# Patient Record
Sex: Male | Born: 1940 | Race: White | Hispanic: No | Marital: Married | Smoking: Former smoker
Health system: Southern US, Community
[De-identification: ages and names within clinical notes are randomized; demographics above are authoritative.]

## PROBLEM LIST (undated history)

## (undated) DIAGNOSIS — K219 Gastro-esophageal reflux disease without esophagitis: Secondary | ICD-10-CM

## (undated) DIAGNOSIS — N189 Chronic kidney disease, unspecified: Secondary | ICD-10-CM

## (undated) DIAGNOSIS — Z973 Presence of spectacles and contact lenses: Secondary | ICD-10-CM

## (undated) DIAGNOSIS — E785 Hyperlipidemia, unspecified: Secondary | ICD-10-CM

## (undated) DIAGNOSIS — C801 Malignant (primary) neoplasm, unspecified: Secondary | ICD-10-CM

## (undated) DIAGNOSIS — I10 Essential (primary) hypertension: Secondary | ICD-10-CM

## (undated) DIAGNOSIS — N201 Calculus of ureter: Secondary | ICD-10-CM

## (undated) DIAGNOSIS — M1990 Unspecified osteoarthritis, unspecified site: Secondary | ICD-10-CM

## (undated) DIAGNOSIS — J45909 Unspecified asthma, uncomplicated: Secondary | ICD-10-CM

## (undated) DIAGNOSIS — Z8711 Personal history of peptic ulcer disease: Secondary | ICD-10-CM

## (undated) DIAGNOSIS — Z992 Dependence on renal dialysis: Secondary | ICD-10-CM

## (undated) DIAGNOSIS — Z972 Presence of dental prosthetic device (complete) (partial): Secondary | ICD-10-CM

## (undated) HISTORY — PX: TONSILLECTOMY: SUR1361

## (undated) HISTORY — PX: HERNIA REPAIR: SHX51

## (undated) HISTORY — PX: CATARACT EXTRACTION W/ INTRAOCULAR LENS  IMPLANT, BILATERAL: SHX1307

## (undated) HISTORY — DX: Chronic kidney disease, unspecified: N18.9

## (undated) HISTORY — PX: MULTIPLE TOOTH EXTRACTIONS: SHX2053

## (undated) HISTORY — DX: Hyperlipidemia, unspecified: E78.5

## (undated) HISTORY — PX: FRACTURE SURGERY: SHX138

---

## 1968-10-03 DIAGNOSIS — Z8711 Personal history of peptic ulcer disease: Secondary | ICD-10-CM

## 1968-10-03 HISTORY — DX: Personal history of peptic ulcer disease: Z87.11

## 2009-02-02 HISTORY — PX: ROBOT ASSISTED LAPAROSCOPIC RADICAL PROSTATECTOMY: SHX5141

## 2009-05-03 ENCOUNTER — Ambulatory Visit (HOSPITAL_COMMUNITY): Admission: RE | Admit: 2009-05-03 | Discharge: 2009-05-03 | Payer: Self-pay | Admitting: Urology

## 2009-07-29 ENCOUNTER — Encounter (INDEPENDENT_AMBULATORY_CARE_PROVIDER_SITE_OTHER): Payer: Self-pay | Admitting: Urology

## 2009-07-29 ENCOUNTER — Inpatient Hospital Stay (HOSPITAL_COMMUNITY): Admission: RE | Admit: 2009-07-29 | Discharge: 2009-07-30 | Payer: Self-pay | Admitting: Urology

## 2010-04-20 LAB — BASIC METABOLIC PANEL
BUN: 31 mg/dL — ABNORMAL HIGH (ref 6–23)
CO2: 29 mEq/L (ref 19–32)
Calcium: 8.6 mg/dL (ref 8.4–10.5)
Chloride: 104 mEq/L (ref 96–112)
Chloride: 105 mEq/L (ref 96–112)
Creatinine, Ser: 1.99 mg/dL — ABNORMAL HIGH (ref 0.4–1.5)
GFR calc Af Amer: 41 mL/min — ABNORMAL LOW (ref >60)
GFR calc Af Amer: 48 mL/min — ABNORMAL LOW (ref >60)
GFR calc non Af Amer: 34 mL/min — ABNORMAL LOW (ref >60)
Potassium: 4.4 mEq/L (ref 3.5–5.1)
Sodium: 137 mEq/L (ref 135–145)

## 2010-04-20 LAB — CBC
MCHC: 32.8 g/dL (ref 30.0–36.0)
MCV: 95.1 fL (ref 78.0–100.0)
Platelets: 157 10*3/uL (ref 150–400)
WBC: 6.1 10*3/uL (ref 4.0–10.5)

## 2010-04-20 LAB — TYPE AND SCREEN

## 2010-04-20 LAB — ABO/RH: ABO/RH(D): A POS

## 2010-04-20 LAB — HEMOGLOBIN AND HEMATOCRIT, BLOOD
HCT: 33.1 % — ABNORMAL LOW (ref 39.0–52.0)
Hemoglobin: 11.2 g/dL — ABNORMAL LOW (ref 13.0–17.0)
Hemoglobin: 12.1 g/dL — ABNORMAL LOW (ref 13.0–17.0)

## 2010-04-20 LAB — SURGICAL PCR SCREEN: MRSA, PCR: NEGATIVE

## 2011-02-06 ENCOUNTER — Other Ambulatory Visit: Payer: Self-pay | Admitting: Physician Assistant

## 2012-05-16 ENCOUNTER — Other Ambulatory Visit: Payer: Self-pay | Admitting: Urology

## 2012-05-17 ENCOUNTER — Encounter (HOSPITAL_BASED_OUTPATIENT_CLINIC_OR_DEPARTMENT_OTHER): Payer: Self-pay | Admitting: *Deleted

## 2012-05-17 NOTE — Progress Notes (Signed)
Pt instructed npo p mn 4/20.  To [facility] 4/21 @ 0830.  Needs istat, ekg on arrival

## 2012-05-19 NOTE — H&P (Signed)
Reason For Visit  Taylor Willis presents today for followup with [name]. The patient presented in January of this year for routine prostate cancer followup was noted to have significant a symptomatic microhematuria. PSA continued to be undetectable. The patient underwent cystoscopy which showed a healthy urethra and no other obvious pathology. CT imaging however did reveal a 5 mm right distal ureteral stone. He came back for follow up and saw [name] where on [name] it was felt that the stone was still present. He is now here for reassessment.   History of Present Illness     Past Taylor Willis:      Taylor Willis  is  status post robotic prostatectomy (07/2009).  The patient did have a more worrisome 4+4=8 cancer going into surgery.  Fortunately we were fairly encouraged by his path report.  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  No evidence of extracapsular extension, negative margins.  Lymph nodes also negative.   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  His incontinence is not terribly bothersome to him.  Erectile activity continues to slowly improve.  Viagra results in a 75% erectile that is adequate for intercourse.  He also continues to use a vacuum erection device.  Stream is, otherwise, strong without other voiding complaints and he typically is dry at night.   Past Medical History Problems  1. History of  Asthma 493.90 2. History of  Gastric Ulcer 531.90 3. History of  Gout 274.00 4. History of  Hypertension 401.9 5. History of  Skin Cancer V10.83  Surgical History Problems  1. History of  Prostatectomy Robotic-Assisted 2. History of  Wrist Surgery  Current Meds 1. Adult Aspirin Low Strength 81 MG Oral Tablet Dispersible; 1 per day; Therapy:  (Recorded:08Jun2012) to 2. Allopurinol TABS; 1 per day; Therapy: (Recorded:08Jun2012) to 3. Lisinopril-Hydrochlorothiazide TABS; TAKE 1 TABLET DAILY; Therapy: (Recorded:08Jun2012)  to 4. Posture-D TABS; 1 per day; Therapy: (Recorded:08Jun2012) to 5. Tamsulosin HCl 0.4 MG Oral Capsule; TAKE 1 CAPSULE BY MOUTH  DAILY; Therapy:  [id number] to (Last Rx:14Mar2014)  Requested for: [id number] 6. Ventolin HFA 108 (90 Base) MCG/ACT Inhalation Aerosol Solution; AS NEEDED; Therapy:  (Recorded:08Jun2012) to 7. Viagra 100 MG Oral Tablet; TAKE AS DIRECTED; Therapy: [phone] to (Last Rx:19Oct2012)   Requested for: 19Oct2012 8. Vitamin B12 TABS; 1 per  day; Therapy: (Recorded:08Jun2012) to  Allergies Medication  1. No Known Drug Allergies  Family History Problems  1. Maternal grandfather's history of  Diabetes Mellitus V18.0 2. [name] history of  Diabetes Mellitus V18.0 3. Family history of  Family Health Status Number Of Children 1 son and 1 daughter (both deceased) 82. Family history of  Father Deceased At Age ____ 24y/o died in War 72. [name] history of  Heart Disease V17.49 6. Family history of  Mother Deceased At Age ____ died MVA  Social History Problems  1. Activities Of Daily Living 2. Being A Social Drinker 3. Caffeine Use 1 qd 4. Exercise Habits He is exercising regularly at home with mobility and stretching exercises some resistance but no cardio. He is very active on the job with lots of walking. 5. Living Independently With Spouse 6. Marital History - Currently Married 7. 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 Self-reliant In Usual Daily Activities 9. 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  Genitourinary: incontinence and erectile dysfunction, but no hematuria.  Respiratory: cough and shortness of breath during exertion.    Vitals Vital Signs [Data Includes: Last 1 Day]  11Apr2014  03:37PM  Blood Pressure: 146 / 81 Temperature: 97.8 F Heart Rate: 70  Well-developed well-nourished male in no acute distress Respiratory: Normal effort Cardiac: Regular rate and rhythm Abdomen: Soft and nontender. No palpable masses Genitourinary: Normal external genitalia Extremities: No tenderness no edema  Results/Data Urine [Data Includes: Last 1 Day]   11Apr2014 COLOR YELLOW  APPEARANCE CLEAR  SPECIFIC GRAVITY 1.020  pH 7.0  GLUCOSE NEG mg/dL BILIRUBIN NEG  KETONE NEG mg/dL BLOOD NEG  PROTEIN NEG mg/dL UROBILINOGEN 0.2 mg/dL NITRITE NEG  LEUKOCYTE ESTERASE NEG   Assessment Assessed  1. Prostate Cancer 185 2. Distal Ureteral Stone On The Right 592.1  Plan Health Maintenance (V70.0)  1. UA With REFLEX  Done: 11Apr2014 02:52PM  Discussion/Summary   Clinically Taylor Willis is doing fine. He is not having any significant flank discomfort. He was, however, noted to have a stone in his right distal ureter several months ago and based on [name] today, there continues to be a faint calcification in the same exact area as noted no CT. His urine is clear, but presumptively the stone is still there. At this point, I do recommend that we go ahead and plan on intervention. Really, nothing good can come of leaving a stone in the distal ureter and therefore I do think that we need to go ahead and perform ureteroscopy with Holmium laser lithotripsy. I would plan on outpatient surgery. Otherwise, from a prostate cancer standpoint, things are fairly stable. The patient's PSA's have continued to be undetectable. He will be due for additional PSA check in a couple of months.

## 2012-05-23 ENCOUNTER — Ambulatory Visit (HOSPITAL_BASED_OUTPATIENT_CLINIC_OR_DEPARTMENT_OTHER)
Admission: RE | Admit: 2012-05-23 | Discharge: 2012-05-23 | Disposition: A | Discharge status: Home / Self Care | Payer: Medicare Other | Source: Ambulatory Visit | Attending: Urology | Admitting: Urology

## 2012-05-23 ENCOUNTER — Encounter (HOSPITAL_BASED_OUTPATIENT_CLINIC_OR_DEPARTMENT_OTHER): Payer: Self-pay | Admitting: Anesthesiology

## 2012-05-23 ENCOUNTER — Other Ambulatory Visit: Payer: Self-pay

## 2012-05-23 ENCOUNTER — Encounter (HOSPITAL_BASED_OUTPATIENT_CLINIC_OR_DEPARTMENT_OTHER)
Admission: RE | Disposition: A | Discharge status: Home / Self Care | Payer: Self-pay | Source: Ambulatory Visit | Attending: Urology

## 2012-05-23 ENCOUNTER — Ambulatory Visit (HOSPITAL_BASED_OUTPATIENT_CLINIC_OR_DEPARTMENT_OTHER): Payer: Medicare Other | Admitting: Anesthesiology

## 2012-05-23 DIAGNOSIS — Z85828 Personal history of other malignant neoplasm of skin: Secondary | ICD-10-CM | POA: Insufficient documentation

## 2012-05-23 DIAGNOSIS — N529 Male erectile dysfunction, unspecified: Secondary | ICD-10-CM | POA: Insufficient documentation

## 2012-05-23 DIAGNOSIS — M109 Gout, unspecified: Secondary | ICD-10-CM | POA: Insufficient documentation

## 2012-05-23 DIAGNOSIS — Z79899 Other long term (current) drug therapy: Secondary | ICD-10-CM | POA: Insufficient documentation

## 2012-05-23 DIAGNOSIS — Z9079 Acquired absence of other genital organ(s): Secondary | ICD-10-CM | POA: Insufficient documentation

## 2012-05-23 DIAGNOSIS — N393 Stress incontinence (female) (male): Secondary | ICD-10-CM | POA: Insufficient documentation

## 2012-05-23 DIAGNOSIS — Z7982 Long term (current) use of aspirin: Secondary | ICD-10-CM | POA: Insufficient documentation

## 2012-05-23 DIAGNOSIS — E669 Obesity, unspecified: Secondary | ICD-10-CM | POA: Insufficient documentation

## 2012-05-23 DIAGNOSIS — I10 Essential (primary) hypertension: Secondary | ICD-10-CM | POA: Insufficient documentation

## 2012-05-23 DIAGNOSIS — Z87891 Personal history of nicotine dependence: Secondary | ICD-10-CM | POA: Insufficient documentation

## 2012-05-23 DIAGNOSIS — N201 Calculus of ureter: Secondary | ICD-10-CM | POA: Insufficient documentation

## 2012-05-23 DIAGNOSIS — J45909 Unspecified asthma, uncomplicated: Secondary | ICD-10-CM | POA: Insufficient documentation

## 2012-05-23 DIAGNOSIS — Z8546 Personal history of malignant neoplasm of prostate: Secondary | ICD-10-CM | POA: Insufficient documentation

## 2012-05-23 DIAGNOSIS — Z6831 Body mass index (BMI) 31.0-31.9, adult: Secondary | ICD-10-CM | POA: Insufficient documentation

## 2012-05-23 HISTORY — PX: CYSTOSCOPY/RETROGRADE/URETEROSCOPY/STONE EXTRACTION WITH BASKET: SHX5317

## 2012-05-23 HISTORY — DX: Unspecified asthma, uncomplicated: J45.909

## 2012-05-23 HISTORY — DX: Calculus of ureter: N20.1

## 2012-05-23 HISTORY — DX: Essential (primary) hypertension: I10

## 2012-05-23 HISTORY — DX: Unspecified osteoarthritis, unspecified site: M19.90

## 2012-05-23 HISTORY — PX: HOLMIUM LASER APPLICATION: SHX5852

## 2012-05-23 HISTORY — DX: Personal history of peptic ulcer disease: Z87.11

## 2012-05-23 LAB — POCT I-STAT 4, (NA,K, GLUC, HGB,HCT)
Glucose, Bld: 110 mg/dL — ABNORMAL HIGH (ref 70–99)
HCT: 41 % (ref 39.0–52.0)

## 2012-05-23 SURGERY — HOLMIUM LASER APPLICATION
Anesthesia: General | Site: Ureter | Laterality: Right | Wound class: Clean Contaminated

## 2012-05-23 MED ORDER — KETOROLAC TROMETHAMINE 30 MG/ML IJ SOLN
INTRAMUSCULAR | Status: DC | PRN
  Administered 2012-05-23: 30 mg via INTRAVENOUS

## 2012-05-23 MED ORDER — HYDROCODONE-ACETAMINOPHEN 5-325 MG PO TABS: 1.0000 | ORAL_TABLET | Freq: Four times a day (QID) | ORAL | Status: DC | PRN

## 2012-05-23 MED ORDER — LACTATED RINGERS IV SOLN
INTRAVENOUS | Status: DC
  Administered 2012-05-23: 09:00:00 via INTRAVENOUS
  Filled 2012-05-23: qty 1000

## 2012-05-23 MED ORDER — PROMETHAZINE HCL 25 MG/ML IJ SOLN
6.2500 mg | INTRAMUSCULAR | Status: DC | PRN
  Filled 2012-05-23: qty 1

## 2012-05-23 MED ORDER — PROPOFOL 10 MG/ML IV BOLUS
INTRAVENOUS | Status: DC | PRN
  Administered 2012-05-23: 350 mg via INTRAVENOUS

## 2012-05-23 MED ORDER — LIDOCAINE HCL (CARDIAC) 20 MG/ML IV SOLN
INTRAVENOUS | Status: DC | PRN
  Administered 2012-05-23: 75 mg via INTRAVENOUS

## 2012-05-23 MED ORDER — CIPROFLOXACIN IN D5W 400 MG/200ML IV SOLN
400.0000 mg | INTRAVENOUS | Status: AC
  Administered 2012-05-23: 400 mg via INTRAVENOUS
  Filled 2012-05-23: qty 200

## 2012-05-23 MED ORDER — SODIUM CHLORIDE 0.9 % IR SOLN
Status: DC | PRN
  Administered 2012-05-23: 6000 mL via INTRAVESICAL

## 2012-05-23 MED ORDER — IOHEXOL 350 MG/ML SOLN
INTRAVENOUS | Status: DC | PRN
  Administered 2012-05-23: 2 mL

## 2012-05-23 MED ORDER — FENTANYL CITRATE 0.05 MG/ML IJ SOLN
INTRAMUSCULAR | Status: DC | PRN
  Administered 2012-05-23 (×2): 25 ug via INTRAVENOUS
  Administered 2012-05-23: 50 ug via INTRAVENOUS

## 2012-05-23 MED ORDER — DEXAMETHASONE SODIUM PHOSPHATE 4 MG/ML IJ SOLN
INTRAMUSCULAR | Status: DC | PRN
  Administered 2012-05-23: 10 mg via INTRAVENOUS

## 2012-05-23 MED ORDER — FENTANYL CITRATE 0.05 MG/ML IJ SOLN
25.0000 ug | INTRAMUSCULAR | Status: DC | PRN
  Filled 2012-05-23: qty 1

## 2012-05-23 MED ORDER — ONDANSETRON HCL 4 MG/2ML IJ SOLN
INTRAMUSCULAR | Status: DC | PRN
  Administered 2012-05-23: 4 mg via INTRAVENOUS

## 2012-05-23 MED ORDER — LIDOCAINE HCL 2 % EX GEL
CUTANEOUS | Status: DC | PRN
  Administered 2012-05-23: 1 via URETHRAL

## 2012-05-23 SURGICAL SUPPLY — 32 items
ADAPTER CATH URET PLST 4-6FR (CATHETERS) ×3 IMPLANT
BAG DRAIN URO-CYSTO SKYTR STRL (DRAIN) ×3 IMPLANT
BASKET LASER NITINOL 1.9FR (BASKET) IMPLANT
BASKET STNLS GEMINI 4WIRE 3FR (BASKET) IMPLANT
BASKET ZERO TIP NITINOL 2.4FR (BASKET) ×3 IMPLANT
BENZOIN TINCTURE PRP APPL 2/3 (GAUZE/BANDAGES/DRESSINGS) IMPLANT
CANISTER SUCT LVC 12 LTR MEDI- (MISCELLANEOUS) ×3 IMPLANT
CATH INTERMIT  6FR 70CM (CATHETERS) ×3 IMPLANT
CATH URET 5FR 28IN CONE TIP (BALLOONS)
CATH URET 5FR 28IN OPEN ENDED (CATHETERS) IMPLANT
CATH URET 5FR 70CM CONE TIP (BALLOONS) IMPLANT
CLOTH BEACON ORANGE TIMEOUT ST (SAFETY) ×3 IMPLANT
DRAPE CAMERA CLOSED 9X96 (DRAPES) ×3 IMPLANT
DRSG TEGADERM 2-3/8X2-3/4 SM (GAUZE/BANDAGES/DRESSINGS) IMPLANT
GLOVE BIO SURGEON STRL SZ 6.5 (GLOVE) ×3 IMPLANT
GLOVE BIO SURGEON STRL SZ7 (GLOVE) ×3 IMPLANT
GLOVE BIO SURGEON STRL SZ7.5 (GLOVE) ×3 IMPLANT
GOWN STRL REIN XL XLG (GOWN DISPOSABLE) ×3 IMPLANT
GUIDEWIRE 0.038 PTFE COATED (WIRE) IMPLANT
GUIDEWIRE ANG ZIPWIRE 038X150 (WIRE) IMPLANT
GUIDEWIRE STR DUAL SENSOR (WIRE) ×3 IMPLANT
IV NS IRRIG 3000ML ARTHROMATIC (IV SOLUTION) ×6 IMPLANT
KIT BALLIN UROMAX 15FX10 (LABEL) IMPLANT
KIT BALLN UROMAX 15FX4 (MISCELLANEOUS) IMPLANT
KIT BALLN UROMAX 26 75X4 (MISCELLANEOUS)
LASER FIBER DISP (UROLOGICAL SUPPLIES) ×3 IMPLANT
LASER FIBER DISP 1000U (UROLOGICAL SUPPLIES) IMPLANT
NS IRRIG 500ML POUR BTL (IV SOLUTION) IMPLANT
PACK CYSTOSCOPY (CUSTOM PROCEDURE TRAY) ×3 IMPLANT
SET HIGH PRES BAL DIL (LABEL)
SHEATH ACCESS URETERAL 38CM (SHEATH) IMPLANT
SHEATH ACCESS URETERAL 54CM (SHEATH) IMPLANT

## 2012-05-23 NOTE — Anesthesia Postprocedure Evaluation (Signed)
  Anesthesia Post-op Note  Patient: Taylor Willis  Procedure(s) Performed: Procedure(s) (LRB): HOLMIUM LASER APPLICATION (Right) CYSTOSCOPY/RETROGRADE/URETEROSCOPY/STONE EXTRACTION WITH BASKET (Right)  Patient Location: PACU  Anesthesia Type: General  Level of Consciousness: awake and alert   Airway and Oxygen Therapy: Patient Spontanous Breathing  Post-op Pain: mild  Post-op Assessment: Post-op Vital signs reviewed, Patient's Cardiovascular Status Stable, Respiratory Function Stable, Patent Airway and No signs of Nausea or vomiting  Last Vitals:  Filed Vitals:   05/23/12 1100  BP: 138/68  Pulse: 55  Temp:   Resp: 10    Post-op Vital Signs: stable   Complications: No apparent anesthesia complications

## 2012-05-23 NOTE — Anesthesia Procedure Notes (Signed)
Procedure Name: LMA Insertion Date/Time: 05/23/2012 9:45 AM Performed by: Christiana Fuchs Pre-anesthesia Checklist: Patient identified, Emergency Drugs available, Suction available and Patient being monitored Patient Re-evaluated:Patient Re-evaluated prior to inductionOxygen Delivery Method: Circle System Utilized Preoxygenation: Pre-oxygenation with 100% oxygen Intubation Type: IV induction Ventilation: Mask ventilation without difficulty LMA: LMA inserted LMA Size: 5.0 Number of attempts: 1 Airway Equipment and Method: bite block Placement Confirmation: positive ETCO2 Tube secured with: Tape Dental Injury: Teeth and Oropharynx as per pre-operative assessment

## 2012-05-23 NOTE — Anesthesia Preprocedure Evaluation (Addendum)
Anesthesia Evaluation  Patient identified by MRN, date of birth, ID band Patient awake    Reviewed: Allergy & Precautions, H&P , NPO status , Patient's Chart, lab work & pertinent test results  Airway Mallampati: II TM Distance: >3 FB Neck ROM: Full    Dental  (+) Upper Dentures, Lower Dentures and Dental Advisory Given   Pulmonary asthma ,  Last inhaler use yesterday. Breathing well today. breath sounds clear to auscultation  Pulmonary exam normal       Cardiovascular Exercise Tolerance: Good hypertension, Pt. on medications Rhythm:Regular Rate:Normal     Neuro/Psych negative neurological ROS  negative psych ROS   GI/Hepatic negative GI ROS, Neg liver ROS,   Endo/Other  negative endocrine ROS  Renal/GU negative Renal ROS  negative genitourinary   Musculoskeletal negative musculoskeletal ROS (+)   Abdominal (+) + obese,   Peds negative pediatric ROS (+)  Hematology negative hematology ROS (+)   Anesthesia Other Findings   Reproductive/Obstetrics negative OB ROS                          Anesthesia Physical Anesthesia Plan  ASA: II  Anesthesia Plan: General   Post-op Pain Management:    Induction: Intravenous  Airway Management Planned: LMA  Additional Equipment:   Intra-op Plan:   Post-operative Plan: Extubation in OR  Informed Consent: I have reviewed the patients History and Physical, chart, labs and discussed the procedure including the risks, benefits and alternatives for the proposed anesthesia with the patient or authorized representative who has indicated his/her understanding and acceptance.   Dental advisory given  Plan Discussed with: CRNA  Anesthesia Plan Comments:         Anesthesia Quick Evaluation

## 2012-05-23 NOTE — Interval H&P Note (Signed)
History and Physical Interval Note:  05/23/2012 8:51 AM  Taylor Willis  has presented today for surgery, with the diagnosis of RIGHT DISTAL URETERAL CALCULUS  The various methods of treatment have been discussed with the patient and family. After consideration of risks, benefits and other options for treatment, the patient has consented to  Procedure(s) with comments: CYSTOSCOPY WITH RETROGRADE PYELOGRAM, URETEROSCOPY AND STENT PLACEMENT (Right) - 1 HR  HOLMIUM LASER APPLICATION (Right) as a surgical intervention .  The patient's history has been reviewed, patient examined, no change in status, stable for surgery.  I have reviewed the patient's chart and labs.  Questions were answered to the patient's satisfaction.     [name] S

## 2012-05-23 NOTE — Transfer of Care (Signed)
Immediate Anesthesia Transfer of Care Note  Patient: Taylor Willis  Procedure(s) Performed: Procedure(s) (LRB): HOLMIUM LASER APPLICATION (Right) CYSTOSCOPY/RETROGRADE/URETEROSCOPY/STONE EXTRACTION WITH BASKET (Right)  Patient Location: Patient transported to PACU with oxygen via face mask at 4 Liters / Min  Anesthesia Type: General  Level of Consciousness: awake and alert   Airway & Oxygen Therapy: Patient Spontanous Breathing and Patient connected to face mask oxygen  Post-op Assessment: Report given to PACU RN and Post -op Vital signs reviewed and stable  Post vital signs: Reviewed and stable  Dentition: Teeth and oropharynx remain in pre-op condition  Complications: No apparent anesthesia complications

## 2012-05-23 NOTE — Op Note (Signed)
Preoperative diagnosis: right ureteral calculus  Postoperative diagnosis:  right ureteral calculus  Procedure:  1. Cystoscopy 2.  right ureteroscopy and stone removal 3. Ureteroscopic laser lithotripsy 4.  right retrograde pyelography with interpretation  Surgeon: Bernestine Amass, MD  Anesthesia: General  Complications: None  Intraoperative findings:  right retrograde pyelography demonstrated a filling defect within the  right ureter consistent with the patient's known calculus without other abnormalities.  EBL: Minimal  Specimens: 1.  right ureteral calculus  Disposition of specimens: [facility] for stone analysis  Indication: Taylor Willis is a 72 y.o.   patient with urolithiasis. After reviewing the management options for treatment, the patient elected to proceed with the above surgical procedure(s). We have discussed the potential benefits and risks of the procedure, side effects of the proposed treatment, the likelihood of the patient achieving the goals of the procedure, and any potential problems that might occur during the procedure or recuperation. Informed consent has been obtained.  Description of procedure:  The patient was taken to the operating room and general anesthesia was induced.  The patient was placed in the dorsal lithotomy position, prepped and draped in the usual sterile fashion, and preoperative antibiotics were administered. A preoperative time-out was performed.   Cystourethroscopy was performed.  The patient's urethra was examined and was normal. The bladder was then systematically examined in its entirety. There was no evidence for any bladder tumors, stones, or other mucosal pathology.    Attention then turned to the  right ureteral orifice and a ureteral catheter was used to intubate the ureteral orifice.  Omnipaque contrast was injected through the ureteral catheter and a retrograde pyelogram was performed with findings as dictated  above.  A 0.38 sensor guidewire was then advanced up the  right ureter into the renal pelvis under fluoroscopic guidance. The 6 Fr semirigid ureteroscope was then advanced into the ureter next to the guidewire and the calculus was identified.   The stone was then fragmented with the 365 micron holmium laser fiber on a setting of 0.8J and frequency of 8 Hz.   All stones were then removed from the ureter with a zero tip nitinol basket.  Reinspection of the ureter revealed no remaining visible stones or fragments.     The bladder was then emptied and the procedure ended.  The patient appeared to tolerate the procedure well and without complications.  The patient was able to be awakened and transferred to the recovery unit in satisfactory condition.

## 2012-05-24 ENCOUNTER — Encounter (HOSPITAL_BASED_OUTPATIENT_CLINIC_OR_DEPARTMENT_OTHER): Payer: Self-pay | Admitting: Urology

## 2017-09-08 DIAGNOSIS — G479 Sleep disorder, unspecified: Secondary | ICD-10-CM | POA: Insufficient documentation

## 2017-09-08 DIAGNOSIS — I10 Essential (primary) hypertension: Secondary | ICD-10-CM | POA: Insufficient documentation

## 2017-09-08 DIAGNOSIS — R32 Unspecified urinary incontinence: Secondary | ICD-10-CM | POA: Insufficient documentation

## 2017-09-08 DIAGNOSIS — E790 Hyperuricemia without signs of inflammatory arthritis and tophaceous disease: Secondary | ICD-10-CM | POA: Insufficient documentation

## 2017-09-08 DIAGNOSIS — Z86010 Personal history of colonic polyps: Secondary | ICD-10-CM | POA: Insufficient documentation

## 2017-09-08 DIAGNOSIS — J4520 Mild intermittent asthma, uncomplicated: Secondary | ICD-10-CM | POA: Insufficient documentation

## 2017-09-08 DIAGNOSIS — Z8546 Personal history of malignant neoplasm of prostate: Secondary | ICD-10-CM | POA: Insufficient documentation

## 2018-03-04 DIAGNOSIS — H2513 Age-related nuclear cataract, bilateral: Secondary | ICD-10-CM | POA: Insufficient documentation

## 2018-03-04 DIAGNOSIS — Z9181 History of falling: Secondary | ICD-10-CM | POA: Insufficient documentation

## 2018-11-24 DIAGNOSIS — D631 Anemia in chronic kidney disease: Secondary | ICD-10-CM | POA: Insufficient documentation

## 2018-11-24 DIAGNOSIS — E875 Hyperkalemia: Secondary | ICD-10-CM | POA: Insufficient documentation

## 2019-01-16 ENCOUNTER — Telehealth (HOSPITAL_COMMUNITY): Payer: Self-pay | Admitting: *Deleted

## 2019-01-16 ENCOUNTER — Other Ambulatory Visit: Payer: Self-pay

## 2019-01-16 DIAGNOSIS — N186 End stage renal disease: Secondary | ICD-10-CM

## 2019-01-16 NOTE — Telephone Encounter (Signed)
The above patient or their representative was contacted and gave the following answers to these questions:         Do you have any of the following symptoms?    NO  Fever                    Cough                   Shortness of breath  Do  you have any of the following other symptoms?  n   muscle pain         vomiting,        diarrhea        rash         weakness        red eye        abdominal pain         bruising          bruising or bleeding              joint pain           severe headache    Have you been in contact with someone who was or has been sick in the past 2 weeks?  NO  Yes                 Unsure                         Unable to assess   Does the person that you were in contact with have any of the following symptoms? n  Cough         shortness of breath           muscle pain         vomiting,            diarrhea            rash            weakness           fever            red eye           abdominal pain           bruising  or  bleeding                joint pain                severe headache                 COMMENTS OR ACTION PLAN FOR THIS PATIENT:

## 2019-01-16 NOTE — Telephone Encounter (Signed)

## 2019-01-17 ENCOUNTER — Encounter: Payer: Self-pay | Admitting: *Deleted

## 2019-01-17 ENCOUNTER — Other Ambulatory Visit: Payer: Self-pay

## 2019-01-17 ENCOUNTER — Other Ambulatory Visit: Payer: Self-pay | Admitting: *Deleted

## 2019-01-17 ENCOUNTER — Encounter: Payer: Self-pay | Admitting: Vascular Surgery

## 2019-01-17 ENCOUNTER — Ambulatory Visit (INDEPENDENT_AMBULATORY_CARE_PROVIDER_SITE_OTHER)
Admission: RE | Admit: 2019-01-17 | Discharge: 2019-01-17 | Disposition: A | Discharge status: Home / Self Care | Payer: Medicare Other | Source: Ambulatory Visit | Attending: Family | Admitting: Family

## 2019-01-17 ENCOUNTER — Ambulatory Visit (HOSPITAL_COMMUNITY)
Admission: RE | Admit: 2019-01-17 | Discharge: 2019-01-17 | Disposition: A | Discharge status: Home / Self Care | Payer: Medicare Other | Source: Ambulatory Visit | Attending: Family | Admitting: Family

## 2019-01-17 ENCOUNTER — Ambulatory Visit (INDEPENDENT_AMBULATORY_CARE_PROVIDER_SITE_OTHER): Payer: Medicare Other | Admitting: Vascular Surgery

## 2019-01-17 VITALS — BP 154/73 | HR 78 | Temp 98.0°F | Resp 20 | Ht 71.0 in | Wt 209.9 lb

## 2019-01-17 DIAGNOSIS — N184 Chronic kidney disease, stage 4 (severe): Secondary | ICD-10-CM

## 2019-01-17 DIAGNOSIS — N186 End stage renal disease: Secondary | ICD-10-CM | POA: Diagnosis not present

## 2019-01-17 NOTE — Progress Notes (Signed)
[facility] [place]  Patient name: Taylor Willis MRN: [id number] DOB: 01-14-1941 Sex: male  REASON FOR CONSULT: Discuss access for hemodialysis  HPI: Taylor Willis is a 78 y.o. male, who is here today for discussion of hemodialysis access.  He has chronic renal insufficiency but is not currently on dialysis.  He reports that his nephrologist feels that there is no imminent need.  I do not have any notes from nephrology service.  He is right-handed.  He does not have a pacemaker.  He is not on any anticoagulant.  Past Medical History:  Diagnosis Date  . Arthritis    gout  . Asthma    has rescue inhaler  . Chronic kidney disease   . H/O peptic ulcer 1970's  . Hyperlipidemia   . Hypertension   . Ureteral stone     History reviewed. No pertinent family history.  SOCIAL HISTORY: Social History   Socioeconomic History  . Marital status: Married    Spouse name: Not on file  . Number of children: Not on file  . Years of education: Not on file  . Highest education level: Not on file  Occupational History  . Not on file  Tobacco Use  . Smoking status: Former Smoker    Types: Cigarettes    Quit date: 05/18/1982    Years since quitting: 36.6  . Smokeless tobacco: Never Used  Substance and Sexual Activity  . Alcohol use: Yes    Alcohol/week: 1.0 standard drinks    Types: 1 Standard drinks or equivalent per week  . Drug use: Never  . Sexual activity: Not on file  Other Topics Concern  . Not on file  Social History Narrative  . Not on file   Social Determinants of Health   Financial Resource Strain:   . Difficulty of Paying Living Expenses: Not on file  Food Insecurity:   . Worried About Charity fundraiser in the Last Year: Not on file  . Ran Out of Food in the Last Year: Not on file  Transportation Needs:   . Lack of Transportation (Medical): Not on file  . Lack of Transportation (Non-Medical): Not on file    Physical Activity:   . Days of Exercise per Week: Not on file  . Minutes of Exercise per Session: Not on file  Stress:   . Feeling of Stress : Not on file  Social Connections:   . Frequency of Communication with Friends and Family: Not on file  . Frequency of Social Gatherings with Friends and Family: Not on file  . Attends Religious Services: Not on file  . Active Member of Clubs or Organizations: Not on file  . Attends Archivist Meetings: Not on file  . Marital Status: Not on file  Intimate Partner Violence:   . Fear of Current or Ex-Partner: Not on file  . Emotionally Abused: Not on file  . Physically Abused: Not on file  . Sexually Abused: Not on file    Allergies  Allergen Reactions  . Aspirin Other (See Comments)    Other reaction(s): Other (see comments) GI irritation GI irritation   . Hydrochlorothiazide     Dizziness and unable to walk Dizziness and unable to walk   . Ibuprofen Other (See Comments)    Other reaction(s): Other (see comments) GI irritation GI irritation GI irritation GI irritation     Current Outpatient Medications  Medication Sig Dispense Refill  . albuterol (PROVENTIL HFA;VENTOLIN  HFA) 108 (90 BASE) MCG/ACT inhaler Inhale 2 puffs into the lungs every 6 (six) hours as needed for wheezing.    Marland Kitchen allopurinol (ZYLOPRIM) 100 MG tablet Take 100 mg by mouth daily.    Marland Kitchen amLODipine (NORVASC) 10 MG tablet     . APPLE CIDER VINEGAR PO Take by mouth.    Marland Kitchen aspirin 81 MG tablet Take 81 mg by mouth daily.    . Cholecalciferol 25 MCG (1000 UT) tablet Take by mouth.    Marland Kitchen CINNAMON PO Take by mouth.    . Cranberry 1000 MG CAPS Take by mouth.    . furosemide (LASIX) 20 MG tablet Take by mouth.    . HONEY PO Take 30-40 mLs by mouth. Pt takes honey, vinegar and cinnamon together    . HYDROcodone-acetaminophen (NORCO/VICODIN) 5-325 MG per tablet Take 1-2 tablets by mouth every 6 (six) hours as needed for pain. 20 tablet 0  . sodium bicarbonate 650 MG  tablet Take by mouth.    . vitamin B-12 (CYANOCOBALAMIN) 100 MCG tablet Take 50 mcg by mouth daily.    . calcium carbonate 200 MG capsule Take 250 mg by mouth 2 (two) times daily with a meal.    . sertraline (ZOLOFT) 25 MG tablet Take by mouth.     No current facility-administered medications for this visit.    REVIEW OF SYSTEMS:  [X]  denotes positive finding, [ ]  denotes negative finding Cardiac  Comments:  Chest pain or chest pressure:    Shortness of breath upon exertion: x   Short of breath when lying flat:    Irregular heart rhythm:        Vascular    Pain in calf, thigh, or hip brought on by ambulation:    Pain in feet at night that wakes you up from your sleep:     Blood clot in your veins:    Leg swelling:         Pulmonary    Oxygen at home:    Productive cough:     Wheezing:         Neurologic    Sudden weakness in arms or legs:     Sudden numbness in arms or legs:     Sudden onset of difficulty speaking or slurred speech:    Temporary loss of vision in one eye:     Problems with dizziness:         Gastrointestinal    Blood in stool:     Vomited blood:         Genitourinary    Burning when urinating:     Blood in urine:        Psychiatric    Major depression:         Hematologic    Bleeding problems:    Problems with blood clotting too easily:        Skin    Rashes or ulcers:        Constitutional    Fever or chills: x     PHYSICAL EXAM: Vitals:   01/17/19 1004  BP: (!) 154/73  Pulse: 78  Resp: 20  Temp: 98 F (36.7 C)  SpO2: 100%  Weight: 209 lb 14.4 oz (95.2 kg)  Height: 5\' 11"  (1.803 m)    GENERAL: The patient is a well-nourished male, in no acute distress. The vital signs are documented above. CARDIOVASCULAR: 2+ radial pulses bilaterally.  Moderate sized cephalic vein at the antecubital space bilaterally by physical exam  PULMONARY: There is good air exchange  ABDOMEN: Soft and non-tender  MUSCULOSKELETAL: There are no major  deformities or cyanosis. NEUROLOGIC: No focal weakness or paresthesias are detected. SKIN: There are no ulcers or rashes noted. PSYCHIATRIC: The patient has a normal affect.  DATA:  Noninvasive studies of his upper extremities both arterial and venous were reviewed.  This shows normal triphasic waveforms at the radial and ulnar level bilaterally.  He does have moderate size cephalic vein from the antecubital space proximally bilaterally  MEDICAL ISSUES: I had a long discussion with the patient regarding options for hemodialysis.  I discussed tunnel catheter for acute need, AV fistula and AV graft.  He does not have any acute need for dialysis currently.  He does appear to have adequate cephalic vein for fistula attempt.  I discussed the procedure and possible nonmaturation.  We will proceed with left arm AV fistula creation at his earliest convenience.  He understands it will take a minimum of 3 months for maturation and that he may require additional procedures to achieve successful hemodialysis access   Rosetta Posner, MD [facility] Tel [phone] Pager [phone]

## 2019-01-17 NOTE — H&P (View-Only) (Signed)
[facility] [place]  Patient name: Taylor Willis MRN: [id number] DOB: Aug 12, 1940 Sex: male  REASON FOR CONSULT: Discuss access for hemodialysis  HPI: Taylor Willis is a 78 y.o. male, who is here today for discussion of hemodialysis access.  He has chronic renal insufficiency but is not currently on dialysis.  He reports that his nephrologist feels that there is no imminent need.  I do not have any notes from nephrology service.  He is right-handed.  He does not have a pacemaker.  He is not on any anticoagulant.  Past Medical History:  Diagnosis Date  . Arthritis    gout  . Asthma    has rescue inhaler  . Chronic kidney disease   . H/O peptic ulcer 1970's  . Hyperlipidemia   . Hypertension   . Ureteral stone     History reviewed. No pertinent family history.  SOCIAL HISTORY: Social History   Socioeconomic History  . Marital status: Married    Spouse name: Not on file  . Number of children: Not on file  . Years of education: Not on file  . Highest education level: Not on file  Occupational History  . Not on file  Tobacco Use  . Smoking status: Former Smoker    Types: Cigarettes    Quit date: 05/18/1982    Years since quitting: 36.6  . Smokeless tobacco: Never Used  Substance and Sexual Activity  . Alcohol use: Yes    Alcohol/week: 1.0 standard drinks    Types: 1 Standard drinks or equivalent per week  . Drug use: Never  . Sexual activity: Not on file  Other Topics Concern  . Not on file  Social History Narrative  . Not on file   Social Determinants of Health   Financial Resource Strain:   . Difficulty of Paying Living Expenses: Not on file  Food Insecurity:   . Worried About Charity fundraiser in the Last Year: Not on file  . Ran Out of Food in the Last Year: Not on file  Transportation Needs:   . Lack of Transportation (Medical): Not on file  . Lack of Transportation (Non-Medical): Not on file    Physical Activity:   . Days of Exercise per Week: Not on file  . Minutes of Exercise per Session: Not on file  Stress:   . Feeling of Stress : Not on file  Social Connections:   . Frequency of Communication with Friends and Family: Not on file  . Frequency of Social Gatherings with Friends and Family: Not on file  . Attends Religious Services: Not on file  . Active Member of Clubs or Organizations: Not on file  . Attends Archivist Meetings: Not on file  . Marital Status: Not on file  Intimate Partner Violence:   . Fear of Current or Ex-Partner: Not on file  . Emotionally Abused: Not on file  . Physically Abused: Not on file  . Sexually Abused: Not on file    Allergies  Allergen Reactions  . Aspirin Other (See Comments)    Other reaction(s): Other (see comments) GI irritation GI irritation   . Hydrochlorothiazide     Dizziness and unable to walk Dizziness and unable to walk   . Ibuprofen Other (See Comments)    Other reaction(s): Other (see comments) GI irritation GI irritation GI irritation GI irritation     Current Outpatient Medications  Medication Sig Dispense Refill  . albuterol (PROVENTIL HFA;VENTOLIN  HFA) 108 (90 BASE) MCG/ACT inhaler Inhale 2 puffs into the lungs every 6 (six) hours as needed for wheezing.    Marland Kitchen allopurinol (ZYLOPRIM) 100 MG tablet Take 100 mg by mouth daily.    Marland Kitchen amLODipine (NORVASC) 10 MG tablet     . APPLE CIDER VINEGAR PO Take by mouth.    Marland Kitchen aspirin 81 MG tablet Take 81 mg by mouth daily.    . Cholecalciferol 25 MCG (1000 UT) tablet Take by mouth.    Marland Kitchen CINNAMON PO Take by mouth.    . Cranberry 1000 MG CAPS Take by mouth.    . furosemide (LASIX) 20 MG tablet Take by mouth.    . HONEY PO Take 30-40 mLs by mouth. Pt takes honey, vinegar and cinnamon together    . HYDROcodone-acetaminophen (NORCO/VICODIN) 5-325 MG per tablet Take 1-2 tablets by mouth every 6 (six) hours as needed for pain. 20 tablet 0  . sodium bicarbonate 650 MG  tablet Take by mouth.    . vitamin B-12 (CYANOCOBALAMIN) 100 MCG tablet Take 50 mcg by mouth daily.    . calcium carbonate 200 MG capsule Take 250 mg by mouth 2 (two) times daily with a meal.    . sertraline (ZOLOFT) 25 MG tablet Take by mouth.     No current facility-administered medications for this visit.    REVIEW OF SYSTEMS:  [X]  denotes positive finding, [ ]  denotes negative finding Cardiac  Comments:  Chest pain or chest pressure:    Shortness of breath upon exertion: x   Short of breath when lying flat:    Irregular heart rhythm:        Vascular    Pain in calf, thigh, or hip brought on by ambulation:    Pain in feet at night that wakes you up from your sleep:     Blood clot in your veins:    Leg swelling:         Pulmonary    Oxygen at home:    Productive cough:     Wheezing:         Neurologic    Sudden weakness in arms or legs:     Sudden numbness in arms or legs:     Sudden onset of difficulty speaking or slurred speech:    Temporary loss of vision in one eye:     Problems with dizziness:         Gastrointestinal    Blood in stool:     Vomited blood:         Genitourinary    Burning when urinating:     Blood in urine:        Psychiatric    Major depression:         Hematologic    Bleeding problems:    Problems with blood clotting too easily:        Skin    Rashes or ulcers:        Constitutional    Fever or chills: x     PHYSICAL EXAM: Vitals:   01/17/19 1004  BP: (!) 154/73  Pulse: 78  Resp: 20  Temp: 98 F (36.7 C)  SpO2: 100%  Weight: 209 lb 14.4 oz (95.2 kg)  Height: 5\' 11"  (1.803 m)    GENERAL: The patient is a well-nourished male, in no acute distress. The vital signs are documented above. CARDIOVASCULAR: 2+ radial pulses bilaterally.  Moderate sized cephalic vein at the antecubital space bilaterally by physical exam  PULMONARY: There is good air exchange  ABDOMEN: Soft and non-tender  MUSCULOSKELETAL: There are no major  deformities or cyanosis. NEUROLOGIC: No focal weakness or paresthesias are detected. SKIN: There are no ulcers or rashes noted. PSYCHIATRIC: The patient has a normal affect.  DATA:  Noninvasive studies of his upper extremities both arterial and venous were reviewed.  This shows normal triphasic waveforms at the radial and ulnar level bilaterally.  He does have moderate size cephalic vein from the antecubital space proximally bilaterally  MEDICAL ISSUES: I had a long discussion with the patient regarding options for hemodialysis.  I discussed tunnel catheter for acute need, AV fistula and AV graft.  He does not have any acute need for dialysis currently.  He does appear to have adequate cephalic vein for fistula attempt.  I discussed the procedure and possible nonmaturation.  We will proceed with left arm AV fistula creation at his earliest convenience.  He understands it will take a minimum of 3 months for maturation and that he may require additional procedures to achieve successful hemodialysis access   Rosetta Posner, MD [facility] Tel [phone] Pager [phone]

## 2019-02-13 ENCOUNTER — Other Ambulatory Visit: Payer: Self-pay

## 2019-02-13 ENCOUNTER — Other Ambulatory Visit (HOSPITAL_COMMUNITY)
Admission: RE | Admit: 2019-02-13 | Discharge: 2019-02-13 | Disposition: A | Discharge status: Home / Self Care | Payer: Medicare Other | Source: Ambulatory Visit | Attending: Vascular Surgery | Admitting: Vascular Surgery

## 2019-02-13 DIAGNOSIS — Z20822 Contact with and (suspected) exposure to covid-19: Secondary | ICD-10-CM | POA: Insufficient documentation

## 2019-02-13 DIAGNOSIS — Z01812 Encounter for preprocedural laboratory examination: Secondary | ICD-10-CM | POA: Diagnosis present

## 2019-02-13 LAB — SARS CORONAVIRUS 2 (TAT 6-24 HRS): SARS Coronavirus 2: NEGATIVE

## 2019-02-14 ENCOUNTER — Other Ambulatory Visit: Payer: Self-pay

## 2019-02-14 ENCOUNTER — Encounter (HOSPITAL_COMMUNITY): Payer: Self-pay | Admitting: Vascular Surgery

## 2019-02-14 NOTE — Progress Notes (Signed)
Pt denies SOB, chest pain being under the care of a cardiologist. Pt stated that PCP is Dr. Louanne Skye. Pt denies having a stress test, echo and cardiac cath. Pt denies having an EKG and chest x ray in the last year. Pt made aware to stop taking  vitamins, fish oil, Cinnamon, Cranberry AZO and herbal medications. Do not take any NSAIDs ie: Ibuprofen, Advil, Naproxen (Aleve), Motrin, BC and Goody Powder. Pt reminded to quarantine. Pt verbalized understanding of all pre-op instructions.

## 2019-02-15 ENCOUNTER — Encounter (HOSPITAL_COMMUNITY)
Admission: RE | Disposition: A | Discharge status: Home / Self Care | Payer: Self-pay | Source: Home / Self Care | Attending: Vascular Surgery

## 2019-02-15 ENCOUNTER — Encounter (HOSPITAL_COMMUNITY): Payer: Self-pay | Admitting: Vascular Surgery

## 2019-02-15 ENCOUNTER — Ambulatory Visit (HOSPITAL_COMMUNITY): Payer: Medicare Other

## 2019-02-15 ENCOUNTER — Ambulatory Visit (HOSPITAL_COMMUNITY)
Admission: RE | Admit: 2019-02-15 | Discharge: 2019-02-15 | Disposition: A | Discharge status: Home / Self Care | Payer: Medicare Other | Attending: Vascular Surgery | Admitting: Vascular Surgery

## 2019-02-15 ENCOUNTER — Other Ambulatory Visit: Payer: Self-pay

## 2019-02-15 DIAGNOSIS — I129 Hypertensive chronic kidney disease with stage 1 through stage 4 chronic kidney disease, or unspecified chronic kidney disease: Secondary | ICD-10-CM | POA: Diagnosis not present

## 2019-02-15 DIAGNOSIS — Z79899 Other long term (current) drug therapy: Secondary | ICD-10-CM | POA: Diagnosis not present

## 2019-02-15 DIAGNOSIS — D631 Anemia in chronic kidney disease: Secondary | ICD-10-CM | POA: Insufficient documentation

## 2019-02-15 DIAGNOSIS — Z7982 Long term (current) use of aspirin: Secondary | ICD-10-CM | POA: Diagnosis not present

## 2019-02-15 DIAGNOSIS — K219 Gastro-esophageal reflux disease without esophagitis: Secondary | ICD-10-CM | POA: Diagnosis not present

## 2019-02-15 DIAGNOSIS — N189 Chronic kidney disease, unspecified: Secondary | ICD-10-CM | POA: Diagnosis present

## 2019-02-15 DIAGNOSIS — Z87891 Personal history of nicotine dependence: Secondary | ICD-10-CM | POA: Insufficient documentation

## 2019-02-15 DIAGNOSIS — E785 Hyperlipidemia, unspecified: Secondary | ICD-10-CM | POA: Insufficient documentation

## 2019-02-15 DIAGNOSIS — Z87442 Personal history of urinary calculi: Secondary | ICD-10-CM | POA: Insufficient documentation

## 2019-02-15 DIAGNOSIS — J45909 Unspecified asthma, uncomplicated: Secondary | ICD-10-CM | POA: Diagnosis not present

## 2019-02-15 DIAGNOSIS — Z8711 Personal history of peptic ulcer disease: Secondary | ICD-10-CM | POA: Diagnosis not present

## 2019-02-15 DIAGNOSIS — Z886 Allergy status to analgesic agent status: Secondary | ICD-10-CM | POA: Diagnosis not present

## 2019-02-15 DIAGNOSIS — Z888 Allergy status to other drugs, medicaments and biological substances status: Secondary | ICD-10-CM | POA: Insufficient documentation

## 2019-02-15 DIAGNOSIS — M109 Gout, unspecified: Secondary | ICD-10-CM | POA: Insufficient documentation

## 2019-02-15 DIAGNOSIS — M1990 Unspecified osteoarthritis, unspecified site: Secondary | ICD-10-CM | POA: Insufficient documentation

## 2019-02-15 DIAGNOSIS — N185 Chronic kidney disease, stage 5: Secondary | ICD-10-CM

## 2019-02-15 HISTORY — DX: Malignant (primary) neoplasm, unspecified: C80.1

## 2019-02-15 HISTORY — DX: Gastro-esophageal reflux disease without esophagitis: K21.9

## 2019-02-15 HISTORY — PX: AV FISTULA PLACEMENT: SHX1204

## 2019-02-15 HISTORY — DX: Presence of dental prosthetic device (complete) (partial): Z97.2

## 2019-02-15 HISTORY — DX: Presence of spectacles and contact lenses: Z97.3

## 2019-02-15 LAB — POCT I-STAT, CHEM 8
BUN: 60 mg/dL — ABNORMAL HIGH (ref 8–23)
Calcium, Ion: 1.14 mmol/L — ABNORMAL LOW (ref 1.15–1.40)
Chloride: 107 mmol/L (ref 98–111)
Creatinine, Ser: 4.3 mg/dL — ABNORMAL HIGH (ref 0.61–1.24)
Glucose, Bld: 92 mg/dL (ref 70–99)
HCT: 31 % — ABNORMAL LOW (ref 39.0–52.0)
Hemoglobin: 10.5 g/dL — ABNORMAL LOW (ref 13.0–17.0)
Potassium: 4.8 mmol/L (ref 3.5–5.1)
Sodium: 139 mmol/L (ref 135–145)
TCO2: 27 mmol/L (ref 22–32)

## 2019-02-15 SURGERY — ARTERIOVENOUS (AV) FISTULA CREATION
Anesthesia: Monitor Anesthesia Care | Laterality: Left

## 2019-02-15 MED ORDER — OXYCODONE HCL 5 MG/5ML PO SOLN: 5.0000 mg | Freq: Once | ORAL | Status: DC | PRN

## 2019-02-15 MED ORDER — OXYCODONE HCL 5 MG PO TABS: 5.0000 mg | ORAL_TABLET | Freq: Once | ORAL | Status: DC | PRN

## 2019-02-15 MED ORDER — FENTANYL CITRATE (PF) 250 MCG/5ML IJ SOLN
INTRAMUSCULAR | Status: DC | PRN
  Administered 2019-02-15: 50 ug via INTRAVENOUS

## 2019-02-15 MED ORDER — PROPOFOL 1000 MG/100ML IV EMUL
INTRAVENOUS | Status: AC
  Filled 2019-02-15: qty 100

## 2019-02-15 MED ORDER — SODIUM CHLORIDE 0.9 % IV SOLN
INTRAVENOUS | Status: DC | PRN
  Administered 2019-02-15: 12:00:00 500 mL

## 2019-02-15 MED ORDER — PROPOFOL 10 MG/ML IV BOLUS
INTRAVENOUS | Status: DC | PRN
  Administered 2019-02-15: 20 mg via INTRAVENOUS

## 2019-02-15 MED ORDER — LIDOCAINE-EPINEPHRINE 0.5 %-1:200000 IJ SOLN
INTRAMUSCULAR | Status: AC
  Filled 2019-02-15: qty 1

## 2019-02-15 MED ORDER — ONDANSETRON HCL 4 MG/2ML IJ SOLN
INTRAMUSCULAR | Status: DC | PRN
  Administered 2019-02-15: 4 mg via INTRAVENOUS

## 2019-02-15 MED ORDER — CEFAZOLIN SODIUM-DEXTROSE 2-4 GM/100ML-% IV SOLN
INTRAVENOUS | Status: AC
  Filled 2019-02-15: qty 100

## 2019-02-15 MED ORDER — LIDOCAINE-EPINEPHRINE 0.5 %-1:200000 IJ SOLN
INTRAMUSCULAR | Status: DC | PRN
  Administered 2019-02-15: 8 mL

## 2019-02-15 MED ORDER — CHLORHEXIDINE GLUCONATE 4 % EX LIQD: 60.0000 mL | Freq: Once | CUTANEOUS | Status: DC

## 2019-02-15 MED ORDER — CEFAZOLIN SODIUM-DEXTROSE 2-4 GM/100ML-% IV SOLN
2.0000 g | INTRAVENOUS | Status: AC
  Administered 2019-02-15: 2 g via INTRAVENOUS

## 2019-02-15 MED ORDER — SODIUM CHLORIDE 0.9 % IV SOLN: INTRAVENOUS | Status: DC | PRN

## 2019-02-15 MED ORDER — OXYCODONE HCL 5 MG PO TABS: 5.0000 mg | ORAL_TABLET | Freq: Four times a day (QID) | ORAL | 0 refills | Status: DC | PRN

## 2019-02-15 MED ORDER — FENTANYL CITRATE (PF) 250 MCG/5ML IJ SOLN
INTRAMUSCULAR | Status: AC
  Filled 2019-02-15: qty 5

## 2019-02-15 MED ORDER — ONDANSETRON HCL 4 MG/2ML IJ SOLN: 4.0000 mg | Freq: Four times a day (QID) | INTRAMUSCULAR | Status: DC | PRN

## 2019-02-15 MED ORDER — MIDAZOLAM HCL 2 MG/2ML IJ SOLN
INTRAMUSCULAR | Status: AC
  Filled 2019-02-15: qty 2

## 2019-02-15 MED ORDER — PHENYLEPHRINE HCL (PRESSORS) 10 MG/ML IV SOLN
INTRAVENOUS | Status: AC
  Filled 2019-02-15: qty 1

## 2019-02-15 MED ORDER — PHENYLEPHRINE HCL-NACL 10-0.9 MG/250ML-% IV SOLN
INTRAVENOUS | Status: DC | PRN
  Administered 2019-02-15: 20 ug/min via INTRAVENOUS

## 2019-02-15 MED ORDER — FENTANYL CITRATE (PF) 100 MCG/2ML IJ SOLN: 25.0000 ug | INTRAMUSCULAR | Status: DC | PRN

## 2019-02-15 MED ORDER — PHENYLEPHRINE 40 MCG/ML (10ML) SYRINGE FOR IV PUSH (FOR BLOOD PRESSURE SUPPORT)
PREFILLED_SYRINGE | INTRAVENOUS | Status: AC
  Filled 2019-02-15: qty 10

## 2019-02-15 MED ORDER — LIDOCAINE 2% (20 MG/ML) 5 ML SYRINGE
INTRAMUSCULAR | Status: AC
  Filled 2019-02-15: qty 5

## 2019-02-15 MED ORDER — PHENYLEPHRINE 40 MCG/ML (10ML) SYRINGE FOR IV PUSH (FOR BLOOD PRESSURE SUPPORT)
PREFILLED_SYRINGE | INTRAVENOUS | Status: DC | PRN
  Administered 2019-02-15: 120 ug via INTRAVENOUS
  Administered 2019-02-15: 160 ug via INTRAVENOUS
  Administered 2019-02-15: 40 ug via INTRAVENOUS
  Administered 2019-02-15: 80 ug via INTRAVENOUS

## 2019-02-15 MED ORDER — SODIUM CHLORIDE 0.9 % IV SOLN
INTRAVENOUS | Status: AC
  Filled 2019-02-15: qty 1.2

## 2019-02-15 MED ORDER — LIDOCAINE 2% (20 MG/ML) 5 ML SYRINGE
INTRAMUSCULAR | Status: DC | PRN
  Administered 2019-02-15: 100 mg via INTRAVENOUS

## 2019-02-15 MED ORDER — ONDANSETRON HCL 4 MG/2ML IJ SOLN
INTRAMUSCULAR | Status: AC
  Filled 2019-02-15: qty 2

## 2019-02-15 MED ORDER — SODIUM CHLORIDE 0.9 % IV SOLN: INTRAVENOUS | Status: DC

## 2019-02-15 MED ORDER — PROPOFOL 500 MG/50ML IV EMUL
INTRAVENOUS | Status: DC | PRN
  Administered 2019-02-15: 80 ug/kg/min via INTRAVENOUS

## 2019-02-15 SURGICAL SUPPLY — 27 items
ARMBAND PINK RESTRICT EXTREMIT (MISCELLANEOUS) ×4 IMPLANT
CANISTER SUCT 3000ML PPV (MISCELLANEOUS) ×2 IMPLANT
CANNULA VESSEL 3MM 2 BLNT TIP (CANNULA) ×2 IMPLANT
CLIP LIGATING EXTRA MED SLVR (CLIP) ×2 IMPLANT
CLIP LIGATING EXTRA SM BLUE (MISCELLANEOUS) ×2 IMPLANT
COVER PROBE W GEL 5X96 (DRAPES) ×2 IMPLANT
COVER WAND RF STERILE (DRAPES) ×2 IMPLANT
DECANTER SPIKE VIAL GLASS SM (MISCELLANEOUS) ×2 IMPLANT
DERMABOND ADVANCED (GAUZE/BANDAGES/DRESSINGS) ×1
DERMABOND ADVANCED .7 DNX12 (GAUZE/BANDAGES/DRESSINGS) ×1 IMPLANT
ELECT REM PT RETURN 9FT ADLT (ELECTROSURGICAL) ×2
ELECTRODE REM PT RTRN 9FT ADLT (ELECTROSURGICAL) ×1 IMPLANT
GLOVE SS BIOGEL STRL SZ 7.5 (GLOVE) ×1 IMPLANT
GLOVE SUPERSENSE BIOGEL SZ 7.5 (GLOVE) ×1
GOWN STRL REUS W/ TWL LRG LVL3 (GOWN DISPOSABLE) ×3 IMPLANT
GOWN STRL REUS W/TWL LRG LVL3 (GOWN DISPOSABLE) ×3
KIT BASIN OR (CUSTOM PROCEDURE TRAY) ×2 IMPLANT
KIT TURNOVER KIT B (KITS) ×2 IMPLANT
NS IRRIG 1000ML POUR BTL (IV SOLUTION) ×2 IMPLANT
PACK CV ACCESS (CUSTOM PROCEDURE TRAY) ×2 IMPLANT
PAD ARMBOARD 7.5X6 YLW CONV (MISCELLANEOUS) ×4 IMPLANT
SUT PROLENE 6 0 CC (SUTURE) ×2 IMPLANT
SUT VIC AB 3-0 SH 27 (SUTURE) ×1
SUT VIC AB 3-0 SH 27X BRD (SUTURE) ×1 IMPLANT
TOWEL GREEN STERILE (TOWEL DISPOSABLE) ×2 IMPLANT
UNDERPAD 30X30 (UNDERPADS AND DIAPERS) ×2 IMPLANT
WATER STERILE IRR 1000ML POUR (IV SOLUTION) ×2 IMPLANT

## 2019-02-15 NOTE — Anesthesia Preprocedure Evaluation (Addendum)
Anesthesia Evaluation  Patient identified by MRN, date of birth, ID band Patient awake    Reviewed: Allergy & Precautions, H&P , NPO status , Patient's Chart, lab work & pertinent test results  Airway Mallampati: II   Neck ROM: full    Dental  (+) Dental Advisory Given   Pulmonary asthma , former smoker,    breath sounds clear to auscultation       Cardiovascular hypertension,  Rhythm:regular Rate:Normal     Neuro/Psych    GI/Hepatic GERD  ,  Endo/Other    Renal/GU ESRFRenal disease     Musculoskeletal  (+) Arthritis ,   Abdominal   Peds  Hematology  (+) anemia ,   Anesthesia Other Findings   Reproductive/Obstetrics                            Anesthesia Physical Anesthesia Plan  ASA: III  Anesthesia Plan: MAC   Post-op Pain Management:    Induction: Intravenous  PONV Risk Score and Plan: 1 and Ondansetron, Propofol infusion and Treatment may vary due to age or medical condition  Airway Management Planned: Simple Face Mask  Additional Equipment:   Intra-op Plan:   Post-operative Plan:   Informed Consent: I have reviewed the patients History and Physical, chart, labs and discussed the procedure including the risks, benefits and alternatives for the proposed anesthesia with the patient or authorized representative who has indicated his/her understanding and acceptance.       Plan Discussed with: CRNA, Anesthesiologist and Surgeon  Anesthesia Plan Comments:         Anesthesia Quick Evaluation

## 2019-02-15 NOTE — Transfer of Care (Signed)
Immediate Anesthesia Transfer of Care Note  Patient: Taylor Willis  Procedure(s) Performed: ARTERIOVENOUS (AV) FISTULA CREATION LEFT ARM (Left )  Patient Location: PACU  Anesthesia Type:MAC  Level of Consciousness: awake, alert  and oriented  Airway & Oxygen Therapy: Patient Spontanous Breathing  Post-op Assessment: Report given to RN, Post -op Vital signs reviewed and stable and Patient moving all extremities X 4  Post vital signs: Reviewed and stable  Last Vitals:  Vitals Value Taken Time  BP    Temp    Pulse 57 02/15/19 1301  Resp 22 02/15/19 1301  SpO2 98 % 02/15/19 1301  Vitals shown include unvalidated device data.  Last Pain:  Vitals:   02/15/19 0938  PainSc: 0-No pain      Patients Stated Pain Goal: 3 (44/01/02 7253)  Complications: No apparent anesthesia complications

## 2019-02-15 NOTE — Progress Notes (Signed)
Pt's blood pressure elevated, 193/74 and 196/77. Pt took home dose of carvedilol PTA. Dr. Marcie Bal made aware. No new orders at this time. Will continue to monitor.   Jacqlyn Larsen, RN

## 2019-02-15 NOTE — Op Note (Signed)
    OPERATIVE REPORT  DATE OF SURGERY: 02/15/2019  PATIENT: Taylor Willis, 79 y.o. male MRN: [id number]  DOB: 11-17-1940  PRE-OPERATIVE DIAGNOSIS: Chronic renal insufficiency  POST-OPERATIVE DIAGNOSIS:  Same  PROCEDURE: Left brachiocephalic AV fistula creation  SURGEON:  Curt Jews, M.D.  PHYSICIAN ASSISTANT: Nurse  ANESTHESIA: With sedation  EBL: per anesthesia record  Total I/O In: 400 [I.V.:300; IV Piggyback:100] Out: 10 [Blood:10]  BLOOD ADMINISTERED: none  DRAINS: none  SPECIMEN: none  COUNTS CORRECT:  YES  PATIENT DISPOSITION:  PACU - hemodynamically stable  PROCEDURE DETAILS: Patient was taken operating placed supine extremity area arm prepped in a sterile fashion.  Local anesthesia was used and incision was made across the antecubital space and carried down to isolate the cephalic vein which was a very large caliber and the brachial artery which was also large caliber.  SonoSite was used to image the vein the vein became small below the antecubital space.  The vein was ligated and divided and mobilized to the level of the brachial artery.  The brachial artery was occluded proximally and distally and opened with 11 blade sent longitudinally with Potts scissors.  The vein was cut to the appropriate length and was spatulated and sewn end-to-side to the artery with a running 6-0 Prolene suture.  Clamps were removed and excellent thrill was noted.  The patient maintained a left radial pulse.  Irrigated with saline.  Hemostasis was obtained with electrocautery.  The wounds were closed with 3-0 Vicryl in the subcutaneous and subcuticular tissue.  Sterile dressing was applied and the patient was transferred to the recovery room in stable condition   Rosetta Posner, M.D., [facility] 02/15/2019 1:04 PM

## 2019-02-15 NOTE — Discharge Instructions (Signed)
° °  [facility] [place] ° °Discharge Instructions ° °AV Fistula or Graft Surgery for Dialysis Access ° °Please refer to the following instructions for your post-procedure care. Your surgeon or physician assistant will discuss any changes with you. ° °Activity ° °You may drive the day following your surgery, if you are comfortable and no longer taking prescription pain medication. Resume full activity as the soreness in your incision resolves. ° °Bathing/Showering ° °You may shower after you go home. Keep your incision dry for 48 hours. Do not soak in a bathtub, hot tub, or swim until the incision heals completely. You may not shower if you have a hemodialysis catheter. ° °Incision Care ° °Clean your incision with mild soap and water after 48 hours. Pat the area dry with a clean towel. You do not need a bandage unless otherwise instructed. Do not apply any ointments or creams to your incision. You may have skin glue on your incision. Do not peel it off. It will come off on its own in about one week. Your arm may swell a bit after surgery. To reduce swelling use pillows to elevate your arm so it is above your heart. Your doctor will tell you if you need to lightly wrap your arm with an ACE bandage. ° °Diet ° °Resume your normal diet. There are not special food restrictions following this procedure. In order to heal from your surgery, it is CRITICAL to get adequate nutrition. Your body requires vitamins, minerals, and protein. Vegetables are the best source of vitamins and minerals. Vegetables also provide the perfect balance of protein. Processed food has little nutritional value, so try to avoid this. ° °Medications ° °Resume taking all of your medications. If your incision is causing pain, you may take over-the counter pain relievers such as acetaminophen (Tylenol). If you were prescribed a stronger pain medication, please be aware these medications can cause nausea and constipation. Prevent  nausea by taking the medication with a snack or meal. Avoid constipation by drinking plenty of fluids and eating foods with high amount of fiber, such as fruits, vegetables, and grains. Do not take Tylenol if you are taking prescription pain medications. ° ° ° ° °Follow up °Your surgeon may want to see you in the office following your access surgery. If so, this will be arranged at the time of your surgery. ° °Please call us immediately for any of the following conditions: ° °Increased pain, redness, drainage (pus) from your incision site °Fever of 101 degrees or higher °Severe or worsening pain at your incision site °Hand pain or numbness. ° °Reduce your risk of vascular disease: ° °Stop smoking. If you would like help, call [facility] at 1-800-QUIT-NOW (1-800-784-8669) or [place] at 336-586-4000 ° °Manage your cholesterol °Maintain a desired weight °Control your diabetes °Keep your blood pressure down ° °Dialysis ° °It will take several weeks to several months for your new dialysis access to be ready for use. Your surgeon will determine when it is OK to use it. Your nephrologist will continue to direct your dialysis. You can continue to use your Permcath until your new access is ready for use. ° °If you have any questions, please call the office at [phone]. ° °

## 2019-02-15 NOTE — Interval H&P Note (Signed)
History and Physical Interval Note:  02/15/2019 11:03 AM  Taylor Willis  has presented today for surgery, with the diagnosis of CHRONIC KIDNEY DISEASE FOR HEMODIALYSIS ACCESS.  The various methods of treatment have been discussed with the patient and family. After consideration of risks, benefits and other options for treatment, the patient has consented to  Procedure(s): ARTERIOVENOUS (AV) FISTULA CREATION LEFT ARM (Left) as a surgical intervention.  The patient's history has been reviewed, patient examined, no change in status, stable for surgery.  I have reviewed the patient's chart and labs.  Questions were answered to the patient's satisfaction.     Curt Jews

## 2019-02-15 NOTE — Anesthesia Procedure Notes (Signed)
Procedure Name: MAC Date/Time: 02/15/2019 11:50 AM Performed by: Harden Mo, CRNA Pre-anesthesia Checklist: Patient identified, Emergency Drugs available, Suction available and Patient being monitored Patient Re-evaluated:Patient Re-evaluated prior to induction Oxygen Delivery Method: Simple face mask Preoxygenation: Pre-oxygenation with 100% oxygen Induction Type: IV induction Placement Confirmation: positive ETCO2 and breath sounds checked- equal and bilateral Dental Injury: Teeth and Oropharynx as per pre-operative assessment

## 2019-02-16 ENCOUNTER — Encounter: Payer: Self-pay | Admitting: *Deleted

## 2019-02-16 NOTE — Anesthesia Postprocedure Evaluation (Signed)
Anesthesia Post Note  Patient: Taylor Willis  Procedure(s) Performed: ARTERIOVENOUS (AV) FISTULA CREATION LEFT ARM (Left )     Patient location during evaluation: PACU Anesthesia Type: MAC Level of consciousness: awake and alert Pain management: pain level controlled Vital Signs Assessment: post-procedure vital signs reviewed and stable Respiratory status: spontaneous breathing, nonlabored ventilation, respiratory function stable and patient connected to nasal cannula oxygen Cardiovascular status: stable and blood pressure returned to baseline Postop Assessment: no apparent nausea or vomiting Anesthetic complications: no    Last Vitals:  Vitals:   02/15/19 1317 02/15/19 1326  BP: 140/69 (!) 147/66  Pulse: 60 (!) 56  Resp: 14 14  Temp:  36.7 C  SpO2: 100% 99%    Last Pain:  Vitals:   02/15/19 1326  PainSc: 0-No pain                 Joliet Mallozzi S

## 2019-03-20 ENCOUNTER — Telehealth (HOSPITAL_COMMUNITY): Payer: Self-pay

## 2019-03-20 NOTE — Telephone Encounter (Signed)

## 2019-03-21 ENCOUNTER — Other Ambulatory Visit: Payer: Self-pay

## 2019-03-21 ENCOUNTER — Encounter: Payer: Self-pay | Admitting: Vascular Surgery

## 2019-03-21 ENCOUNTER — Ambulatory Visit (HOSPITAL_COMMUNITY)
Admission: RE | Admit: 2019-03-21 | Discharge: 2019-03-21 | Disposition: A | Discharge status: Home / Self Care | Payer: Medicare Other | Source: Ambulatory Visit | Attending: Surgery | Admitting: Surgery

## 2019-03-21 ENCOUNTER — Ambulatory Visit (INDEPENDENT_AMBULATORY_CARE_PROVIDER_SITE_OTHER): Payer: Self-pay | Admitting: Vascular Surgery

## 2019-03-21 VITALS — BP 129/59 | HR 64 | Temp 97.9°F | Resp 20 | Ht 71.0 in | Wt 213.0 lb

## 2019-03-21 DIAGNOSIS — N186 End stage renal disease: Secondary | ICD-10-CM | POA: Insufficient documentation

## 2019-03-21 DIAGNOSIS — N184 Chronic kidney disease, stage 4 (severe): Secondary | ICD-10-CM

## 2019-03-21 NOTE — Progress Notes (Signed)
   Patient name: Taylor Willis MRN: [id number] DOB: 1940/09/14 Sex: male  REASON FOR VISIT: Follow-up recent left brachiocephalic AV fistula creation  HPI: Taylor Willis is a 79 y.o. male here today for follow-up of left brachiocephalic AV fistula creation.  He was on 02/15/2019.  He is still not in renal failure and is not on hemodialysis  Current Outpatient Medications  Medication Sig Dispense Refill  . acetaminophen (TYLENOL) 500 MG tablet Take 1,000 mg by mouth every 6 (six) hours as needed for mild pain or headache.    . albuterol (PROVENTIL HFA;VENTOLIN HFA) 108 (90 BASE) MCG/ACT inhaler Inhale 2 puffs into the lungs every 6 (six) hours as needed for wheezing.    Marland Kitchen allopurinol (ZYLOPRIM) 100 MG tablet Take 100 mg by mouth daily.    Marland Kitchen amLODipine (NORVASC) 10 MG tablet Take 10 mg by mouth daily.     . carvedilol (COREG) 3.125 MG tablet Take 3.125 mg by mouth 2 (two) times daily with a meal.    . Cholecalciferol 25 MCG (1000 UT) tablet Take 1,000 Units by mouth daily.     . Cinnamon 500 MG capsule Take 500 mg by mouth daily.    . Cranberry 250 MG CAPS Take by mouth.    . Cranberry-Vitamin C-Probiotic (AZO CRANBERRY PO) Take 2 tablets by mouth daily.    . furosemide (LASIX) 40 MG tablet Take 40 mg by mouth daily.    Marland Kitchen lisinopril (ZESTRIL) 20 MG tablet Take 20 mg by mouth daily.    . sodium bicarbonate 650 MG tablet Take 650 mg by mouth 3 (three) times daily.    . vitamin B-12 (CYANOCOBALAMIN) 1000 MCG tablet Take 1,000 mcg by mouth daily.    Marland Kitchen oxyCODONE (OXY IR/ROXICODONE) 5 MG immediate release tablet Take 1 tablet (5 mg total) by mouth every 6 (six) hours as needed (for pain score of 1-4). (Patient not taking: Reported on 03/21/2019) 6 tablet 0   No current facility-administered medications for this visit.     PHYSICAL EXAM: Vitals:   03/21/19 1459  BP: (!) 129/59  Pulse: 64  Resp: 20  Temp: 97.9 F (36.6 C)  SpO2: 98%  Weight: 213 lb (96.6 kg)    Height: 5\' 11"  (1.803 m)    GENERAL: The patient is a well-nourished male, in no acute distress. The vital signs are documented above. Antecubital incision is well-healed.  He has 2+ radial pulses with no steal symptoms.  Excellent thrill with a very nicely matured superficial vein.  He does have moderate tortuosity  Duplex today shows no evidence of stenosis in his fistula.  Good diameter in the 6 mm range  MEDICAL ISSUES: Excellent Arath Kaigler maturation of his AV fistula.  We will continue his follow-up with nephrology.  Will see [place] again on an as-needed basis   Rosetta Posner, MD [facility] Special[facility] of [facility] Tel [phone] Pager [phone]

## 2019-08-21 ENCOUNTER — Other Ambulatory Visit: Payer: Self-pay

## 2019-08-21 DIAGNOSIS — N186 End stage renal disease: Secondary | ICD-10-CM

## 2019-08-31 ENCOUNTER — Ambulatory Visit (INDEPENDENT_AMBULATORY_CARE_PROVIDER_SITE_OTHER): Payer: Medicare Other | Admitting: Vascular Surgery

## 2019-08-31 ENCOUNTER — Other Ambulatory Visit: Payer: Self-pay

## 2019-08-31 ENCOUNTER — Ambulatory Visit (HOSPITAL_COMMUNITY)
Admission: RE | Admit: 2019-08-31 | Discharge: 2019-08-31 | Disposition: A | Discharge status: Home / Self Care | Payer: Medicare Other | Source: Ambulatory Visit | Attending: Vascular Surgery | Admitting: Vascular Surgery

## 2019-08-31 ENCOUNTER — Encounter: Payer: Self-pay | Admitting: Vascular Surgery

## 2019-08-31 VITALS — BP 154/66 | HR 59 | Temp 97.9°F | Ht 71.0 in | Wt 204.3 lb

## 2019-08-31 DIAGNOSIS — N185 Chronic kidney disease, stage 5: Secondary | ICD-10-CM | POA: Diagnosis not present

## 2019-08-31 DIAGNOSIS — N186 End stage renal disease: Secondary | ICD-10-CM | POA: Insufficient documentation

## 2019-08-31 NOTE — Progress Notes (Signed)
Present illness: Patient is a 79 year old male who returns for follow-up today. He had a left brachiocephalic AV fistula created by Dr. Donnetta Hutching January 2021. He was referred back by Dr. Theador Hawthorne today for consideration of sidebranch ligation. He is not currently on hemodialysis. He is CKD 5. He has no numbness or tingling in his hand.  Review of systems: He has no shortness of breath. He has no chest pain.  Past Medical History:  Diagnosis Date  . Arthritis    gout  . Asthma    has rescue inhaler  . Cancer [facility])    prostate  . Chronic kidney disease   . GERD (gastroesophageal reflux disease)   . H/O peptic ulcer 1970's  . Hyperlipidemia   . Hypertension   . Ureteral stone   . Wears dentures   . Wears glasses     Past Surgical History:  Procedure Laterality Date  . AV FISTULA PLACEMENT Left 02/15/2019   Procedure: ARTERIOVENOUS (AV) FISTULA CREATION LEFT ARM;  Surgeon: Rosetta Posner, MD;  Location: [place];  Service: Vascular;  Laterality: Left;  . CATARACT EXTRACTION W/ INTRAOCULAR LENS  IMPLANT, BILATERAL    . CYSTOSCOPY/RETROGRADE/URETEROSCOPY/STONE EXTRACTION WITH BASKET Right 05/23/2012   Procedure: CYSTOSCOPY/RETROGRADE/URETEROSCOPY/STONE EXTRACTION WITH BASKET;  Surgeon: Bernestine Amass, MD;  Location: [facility];  Service: Urology;  Laterality: Right;  . FRACTURE SURGERY Right    as a child  . HERNIA REPAIR    . HOLMIUM LASER APPLICATION Right 8/54/6270   Procedure: HOLMIUM LASER APPLICATION;  Surgeon: Bernestine Amass, MD;  Location: [facility];  Service: Urology;  Laterality: Right;  . MULTIPLE TOOTH EXTRACTIONS    . ROBOT ASSISTED LAPAROSCOPIC RADICAL PROSTATECTOMY  2011  . TONSILLECTOMY      Current Outpatient Medications on File Prior to Visit  Medication Sig Dispense Refill  . acetaminophen (TYLENOL) 500 MG tablet Take 1,000 mg by mouth every 6 (six) hours as needed for mild pain or headache.    . albuterol (PROVENTIL HFA;VENTOLIN HFA) 108  (90 BASE) MCG/ACT inhaler Inhale 2 puffs into the lungs every 6 (six) hours as needed for wheezing.    Marland Kitchen allopurinol (ZYLOPRIM) 100 MG tablet Take 100 mg by mouth daily.    Marland Kitchen amLODipine (NORVASC) 10 MG tablet Take 10 mg by mouth daily.     . carvedilol (COREG) 3.125 MG tablet Take 3.125 mg by mouth 2 (two) times daily with a meal.    . Cinnamon 500 MG capsule Take 500 mg by mouth daily.    . Cranberry-Vitamin C-Probiotic (AZO CRANBERRY PO) Take 2 tablets by mouth daily.    . furosemide (LASIX) 40 MG tablet Take 40 mg by mouth daily.    . sodium bicarbonate 650 MG tablet Take 650 mg by mouth 3 (three) times daily.    . vitamin B-12 (CYANOCOBALAMIN) 1000 MCG tablet Take 1,000 mcg by mouth daily.    . Cholecalciferol 25 MCG (1000 UT) tablet Take 1,000 Units by mouth daily.     . Cranberry 250 MG CAPS Take by mouth. (Patient not taking: Reported on 08/31/2019)    . lisinopril (ZESTRIL) 20 MG tablet Take 20 mg by mouth daily. (Patient not taking: Reported on 08/31/2019)    . oxyCODONE (OXY IR/ROXICODONE) 5 MG immediate release tablet Take 1 tablet (5 mg total) by mouth every 6 (six) hours as needed (for pain score of 1-4). (Patient not taking: Reported on 03/21/2019) 6 tablet 0   No current facility-administered medications on  file prior to visit.   Physical exam:  Vitals:   08/31/19 1033  BP: (!) 154/66  Pulse: 59  Temp: 97.9 F (36.6 C)  TempSrc: Skin  SpO2: 99%  Weight: (!) 204 lb 4.8 oz (92.7 kg)  Height: 5\' 11"  (1.803 m)    Left upper extremity palpable thrill in fistula severe tortuosity multiple side branches  Data: Patient had a duplex ultrasound of his AV fistula today. I reviewed and interpreted the study. Fistula diameter was 5 to 7 mm. There were at least 2 side branches. There was some drop of velocity in the mid upper arm at the level of the sidebranch suggesting there may be a narrowing in this area.  Assessment: Multiple side branches left upper arm AV fistula possible  narrowing.  Plan: Sidebranch ligation with possible revision of left upper arm AV fistula scheduled for Tuesday, September 12, 2019. Risk benefits possible complications of procedure details were discussed the patient today including not limited to bleeding infection possible fistula thrombosis. He understands and agrees to proceed.  Ruta Hinds, MD [facility] [place] Office: [phone]

## 2019-08-31 NOTE — H&P (View-Only) (Signed)
Present illness: Patient is a 79 year old male who returns for follow-up today. He had a left brachiocephalic AV fistula created by Dr. Donnetta Hutching January 2021. He was referred back by Dr. Theador Hawthorne today for consideration of sidebranch ligation. He is not currently on hemodialysis. He is CKD 5. He has no numbness or tingling in his hand.  Review of systems: He has no shortness of breath. He has no chest pain.  Past Medical History:  Diagnosis Date  . Arthritis    gout  . Asthma    has rescue inhaler  . Cancer [facility])    prostate  . Chronic kidney disease   . GERD (gastroesophageal reflux disease)   . H/O peptic ulcer 1970's  . Hyperlipidemia   . Hypertension   . Ureteral stone   . Wears dentures   . Wears glasses     Past Surgical History:  Procedure Laterality Date  . AV FISTULA PLACEMENT Left 02/15/2019   Procedure: ARTERIOVENOUS (AV) FISTULA CREATION LEFT ARM;  Surgeon: Rosetta Posner, MD;  Location: [place];  Service: Vascular;  Laterality: Left;  . CATARACT EXTRACTION W/ INTRAOCULAR LENS  IMPLANT, BILATERAL    . CYSTOSCOPY/RETROGRADE/URETEROSCOPY/STONE EXTRACTION WITH BASKET Right 05/23/2012   Procedure: CYSTOSCOPY/RETROGRADE/URETEROSCOPY/STONE EXTRACTION WITH BASKET;  Surgeon: Bernestine Amass, MD;  Location: [facility];  Service: Urology;  Laterality: Right;  . FRACTURE SURGERY Right    as a child  . HERNIA REPAIR    . HOLMIUM LASER APPLICATION Right 0/98/1191   Procedure: HOLMIUM LASER APPLICATION;  Surgeon: Bernestine Amass, MD;  Location: [facility];  Service: Urology;  Laterality: Right;  . MULTIPLE TOOTH EXTRACTIONS    . ROBOT ASSISTED LAPAROSCOPIC RADICAL PROSTATECTOMY  2011  . TONSILLECTOMY      Current Outpatient Medications on File Prior to Visit  Medication Sig Dispense Refill  . acetaminophen (TYLENOL) 500 MG tablet Take 1,000 mg by mouth every 6 (six) hours as needed for mild pain or headache.    . albuterol (PROVENTIL HFA;VENTOLIN HFA) 108  (90 BASE) MCG/ACT inhaler Inhale 2 puffs into the lungs every 6 (six) hours as needed for wheezing.    Marland Kitchen allopurinol (ZYLOPRIM) 100 MG tablet Take 100 mg by mouth daily.    Marland Kitchen amLODipine (NORVASC) 10 MG tablet Take 10 mg by mouth daily.     . carvedilol (COREG) 3.125 MG tablet Take 3.125 mg by mouth 2 (two) times daily with a meal.    . Cinnamon 500 MG capsule Take 500 mg by mouth daily.    . Cranberry-Vitamin C-Probiotic (AZO CRANBERRY PO) Take 2 tablets by mouth daily.    . furosemide (LASIX) 40 MG tablet Take 40 mg by mouth daily.    . sodium bicarbonate 650 MG tablet Take 650 mg by mouth 3 (three) times daily.    . vitamin B-12 (CYANOCOBALAMIN) 1000 MCG tablet Take 1,000 mcg by mouth daily.    . Cholecalciferol 25 MCG (1000 UT) tablet Take 1,000 Units by mouth daily.     . Cranberry 250 MG CAPS Take by mouth. (Patient not taking: Reported on 08/31/2019)    . lisinopril (ZESTRIL) 20 MG tablet Take 20 mg by mouth daily. (Patient not taking: Reported on 08/31/2019)    . oxyCODONE (OXY IR/ROXICODONE) 5 MG immediate release tablet Take 1 tablet (5 mg total) by mouth every 6 (six) hours as needed (for pain score of 1-4). (Patient not taking: Reported on 03/21/2019) 6 tablet 0   No current facility-administered medications on  file prior to visit.   Physical exam:  Vitals:   08/31/19 1033  BP: (!) 154/66  Pulse: 59  Temp: 97.9 F (36.6 C)  TempSrc: Skin  SpO2: 99%  Weight: (!) 204 lb 4.8 oz (92.7 kg)  Height: 5\' 11"  (1.803 m)    Left upper extremity palpable thrill in fistula severe tortuosity multiple side branches  Data: Patient had a duplex ultrasound of his AV fistula today. I reviewed and interpreted the study. Fistula diameter was 5 to 7 mm. There were at least 2 side branches. There was some drop of velocity in the mid upper arm at the level of the sidebranch suggesting there may be a narrowing in this area.  Assessment: Multiple side branches left upper arm AV fistula possible  narrowing.  Plan: Sidebranch ligation with possible revision of left upper arm AV fistula scheduled for Tuesday, September 12, 2019. Risk benefits possible complications of procedure details were discussed the patient today including not limited to bleeding infection possible fistula thrombosis. He understands and agrees to proceed.  Ruta Hinds, MD [facility] [place] Office: [phone]

## 2019-09-07 ENCOUNTER — Other Ambulatory Visit: Payer: Self-pay

## 2019-09-08 ENCOUNTER — Encounter (HOSPITAL_COMMUNITY): Payer: Self-pay | Admitting: Vascular Surgery

## 2019-09-08 ENCOUNTER — Other Ambulatory Visit: Payer: Self-pay

## 2019-09-08 NOTE — Progress Notes (Signed)
Pt denies SOB, chest pain being under the care of a cardiologist. Pt stated that PCP is Dr. Oswald Hillock. Pt denies having a stress test, echo and cardiac cath. Pt denies having a chest x ray in the last year. Pt made aware to stop taking  vitamins, fish oil, Cinnamon, Cranberry AZO and herbal medications. Do not take any NSAIDs ie: Ibuprofen, Advil, Naproxen (Aleve), Motrin, BC and Goody Powder. Pt reminded to quarantine. Pt verbalized understanding of all pre-op instructions.

## 2019-09-11 ENCOUNTER — Other Ambulatory Visit (HOSPITAL_COMMUNITY)
Admission: RE | Admit: 2019-09-11 | Discharge: 2019-09-11 | Disposition: A | Discharge status: Home / Self Care | Payer: Medicare Other | Source: Ambulatory Visit | Attending: Vascular Surgery | Admitting: Vascular Surgery

## 2019-09-11 DIAGNOSIS — Z20822 Contact with and (suspected) exposure to covid-19: Secondary | ICD-10-CM | POA: Diagnosis not present

## 2019-09-11 DIAGNOSIS — Z01812 Encounter for preprocedural laboratory examination: Secondary | ICD-10-CM | POA: Diagnosis present

## 2019-09-11 LAB — SARS CORONAVIRUS 2 (TAT 6-24 HRS): SARS Coronavirus 2: NEGATIVE

## 2019-09-11 NOTE — Anesthesia Preprocedure Evaluation (Addendum)
Anesthesia Evaluation  Patient identified by MRN, date of birth, ID band Patient awake    Reviewed: Allergy & Precautions, NPO status , Patient's Chart, lab work & pertinent test results, reviewed documented beta blocker date and time   Airway Mallampati: I  TM Distance: >3 FB Neck ROM: Full    Dental  (+) Edentulous Upper, Edentulous Lower   Pulmonary shortness of breath and with exertion, asthma , former smoker,  Quit smoking 1984 Albuterol inhaler- last used this AM   Pulmonary exam normal breath sounds clear to auscultation       Cardiovascular hypertension, Pt. on medications and Pt. 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  negative genitourinary   Musculoskeletal  (+) Arthritis , Osteoarthritis,    Abdominal   Peds  Hematology  (+) Blood dyscrasia, anemia , hct 33   Anesthesia Other Findings   Reproductive/Obstetrics negative OB ROS                            Anesthesia Physical Anesthesia Plan  ASA: III  Anesthesia Plan: MAC and Regional   Post-op Pain Management:  Regional for Post-op pain   Induction:   PONV Risk Score and Plan: 2 and Propofol infusion and TIVA  Airway Management Planned: Natural Airway and Simple Face Mask  Additional Equipment: None  Intra-op Plan:   Post-operative Plan:   Informed Consent: I have reviewed the patients History and Physical, chart, labs and discussed the procedure including the risks, benefits and alternatives for the proposed anesthesia with the patient or authorized representative who has indicated his/her understanding and acceptance.       Plan Discussed  with: CRNA  Anesthesia Plan Comments:        Anesthesia Quick Evaluation

## 2019-09-12 ENCOUNTER — Encounter (HOSPITAL_COMMUNITY): Payer: Self-pay | Admitting: Vascular Surgery

## 2019-09-12 ENCOUNTER — Encounter (HOSPITAL_COMMUNITY)
Admission: RE | Disposition: A | Discharge status: Home / Self Care | Payer: Self-pay | Source: Home / Self Care | Attending: Vascular Surgery

## 2019-09-12 ENCOUNTER — Ambulatory Visit (HOSPITAL_COMMUNITY): Payer: Medicare Other | Admitting: Anesthesiology

## 2019-09-12 ENCOUNTER — Ambulatory Visit (HOSPITAL_COMMUNITY)
Admission: RE | Admit: 2019-09-12 | Discharge: 2019-09-12 | Disposition: A | Discharge status: Home / Self Care | Payer: Medicare Other | Attending: Vascular Surgery | Admitting: Vascular Surgery

## 2019-09-12 ENCOUNTER — Other Ambulatory Visit: Payer: Self-pay

## 2019-09-12 DIAGNOSIS — M109 Gout, unspecified: Secondary | ICD-10-CM | POA: Insufficient documentation

## 2019-09-12 DIAGNOSIS — Z79899 Other long term (current) drug therapy: Secondary | ICD-10-CM | POA: Insufficient documentation

## 2019-09-12 DIAGNOSIS — E785 Hyperlipidemia, unspecified: Secondary | ICD-10-CM | POA: Diagnosis not present

## 2019-09-12 DIAGNOSIS — N184 Chronic kidney disease, stage 4 (severe): Secondary | ICD-10-CM

## 2019-09-12 DIAGNOSIS — I120 Hypertensive chronic kidney disease with stage 5 chronic kidney disease or end stage renal disease: Secondary | ICD-10-CM | POA: Insufficient documentation

## 2019-09-12 DIAGNOSIS — T82898A Other specified complication of vascular prosthetic devices, implants and grafts, initial encounter: Secondary | ICD-10-CM

## 2019-09-12 DIAGNOSIS — Y839 Surgical procedure, unspecified as the cause of abnormal reaction of the patient, or of later complication, without mention of misadventure at the time of the procedure: Secondary | ICD-10-CM | POA: Insufficient documentation

## 2019-09-12 DIAGNOSIS — Z8546 Personal history of malignant neoplasm of prostate: Secondary | ICD-10-CM | POA: Diagnosis not present

## 2019-09-12 DIAGNOSIS — J45909 Unspecified asthma, uncomplicated: Secondary | ICD-10-CM | POA: Insufficient documentation

## 2019-09-12 DIAGNOSIS — K219 Gastro-esophageal reflux disease without esophagitis: Secondary | ICD-10-CM | POA: Insufficient documentation

## 2019-09-12 DIAGNOSIS — T82590A Other mechanical complication of surgically created arteriovenous fistula, initial encounter: Secondary | ICD-10-CM | POA: Diagnosis not present

## 2019-09-12 DIAGNOSIS — N185 Chronic kidney disease, stage 5: Secondary | ICD-10-CM | POA: Insufficient documentation

## 2019-09-12 HISTORY — PX: REVISON OF ARTERIOVENOUS FISTULA: SHX6074

## 2019-09-12 HISTORY — PX: LIGATION OF ARTERIOVENOUS  FISTULA: SHX5948

## 2019-09-12 LAB — POCT I-STAT, CHEM 8
BUN: 68 mg/dL — ABNORMAL HIGH (ref 8–23)
Calcium, Ion: 1.09 mmol/L — ABNORMAL LOW (ref 1.15–1.40)
Chloride: 103 mmol/L (ref 98–111)
Creatinine, Ser: 6.4 mg/dL — ABNORMAL HIGH (ref 0.61–1.24)
Glucose, Bld: 103 mg/dL — ABNORMAL HIGH (ref 70–99)
HCT: 33 % — ABNORMAL LOW (ref 39.0–52.0)
Hemoglobin: 11.2 g/dL — ABNORMAL LOW (ref 13.0–17.0)
Potassium: 3.8 mmol/L (ref 3.5–5.1)
Sodium: 144 mmol/L (ref 135–145)
TCO2: 26 mmol/L (ref 22–32)

## 2019-09-12 SURGERY — LIGATION OF ARTERIOVENOUS  FISTULA
Anesthesia: Monitor Anesthesia Care | Site: Arm Upper | Laterality: Left

## 2019-09-12 MED ORDER — SODIUM CHLORIDE 0.9 % IV SOLN
INTRAVENOUS | Status: AC
  Filled 2019-09-12: qty 1.2

## 2019-09-12 MED ORDER — SODIUM CHLORIDE 0.9 % IV SOLN: INTRAVENOUS | Status: DC

## 2019-09-12 MED ORDER — 0.9 % SODIUM CHLORIDE (POUR BTL) OPTIME
TOPICAL | Status: DC | PRN
  Administered 2019-09-12: 1000 mL

## 2019-09-12 MED ORDER — LIDOCAINE 2% (20 MG/ML) 5 ML SYRINGE
INTRAMUSCULAR | Status: AC
  Filled 2019-09-12: qty 5

## 2019-09-12 MED ORDER — DEXAMETHASONE SODIUM PHOSPHATE 10 MG/ML IJ SOLN
INTRAMUSCULAR | Status: AC
  Filled 2019-09-12: qty 1

## 2019-09-12 MED ORDER — SODIUM CHLORIDE 0.9 % IV SOLN
INTRAVENOUS | Status: DC | PRN
  Administered 2019-09-12: 500 mL

## 2019-09-12 MED ORDER — FENTANYL CITRATE (PF) 250 MCG/5ML IJ SOLN
INTRAMUSCULAR | Status: AC
  Filled 2019-09-12: qty 5

## 2019-09-12 MED ORDER — PHENYLEPHRINE HCL-NACL 10-0.9 MG/250ML-% IV SOLN
INTRAVENOUS | Status: DC | PRN
  Administered 2019-09-12: 30 ug/min via INTRAVENOUS

## 2019-09-12 MED ORDER — LIDOCAINE HCL 1 % IJ SOLN
INTRAMUSCULAR | Status: DC | PRN
  Administered 2019-09-12: 30 mL

## 2019-09-12 MED ORDER — LIDOCAINE HCL (PF) 1 % IJ SOLN
INTRAMUSCULAR | Status: AC
  Filled 2019-09-12: qty 30

## 2019-09-12 MED ORDER — PROPOFOL 10 MG/ML IV BOLUS
INTRAVENOUS | Status: AC
  Filled 2019-09-12: qty 20

## 2019-09-12 MED ORDER — PROPOFOL 10 MG/ML IV BOLUS
INTRAVENOUS | Status: DC | PRN
  Administered 2019-09-12: 20 mg via INTRAVENOUS

## 2019-09-12 MED ORDER — FENTANYL CITRATE (PF) 100 MCG/2ML IJ SOLN
INTRAMUSCULAR | Status: DC | PRN
  Administered 2019-09-12: 50 ug via INTRAVENOUS

## 2019-09-12 MED ORDER — PHENYLEPHRINE 40 MCG/ML (10ML) SYRINGE FOR IV PUSH (FOR BLOOD PRESSURE SUPPORT)
PREFILLED_SYRINGE | INTRAVENOUS | Status: AC
  Filled 2019-09-12: qty 10

## 2019-09-12 MED ORDER — CHLORHEXIDINE GLUCONATE 0.12 % MT SOLN
15.0000 mL | Freq: Once | OROMUCOSAL | Status: AC
  Administered 2019-09-12: 15 mL via OROMUCOSAL
  Filled 2019-09-12 (×2): qty 15

## 2019-09-12 MED ORDER — LIDOCAINE-EPINEPHRINE (PF) 1.5 %-1:200000 IJ SOLN
INTRAMUSCULAR | Status: DC | PRN
Start: 2019-09-12 — End: 2019-09-12
  Administered 2019-09-12: 25 mL via PERINEURAL

## 2019-09-12 MED ORDER — ONDANSETRON HCL 4 MG/2ML IJ SOLN
INTRAMUSCULAR | Status: DC | PRN
  Administered 2019-09-12: 4 mg via INTRAVENOUS

## 2019-09-12 MED ORDER — PROPOFOL 500 MG/50ML IV EMUL
INTRAVENOUS | Status: DC | PRN
  Administered 2019-09-12: 75 ug/kg/min via INTRAVENOUS

## 2019-09-12 MED ORDER — PHENYLEPHRINE 40 MCG/ML (10ML) SYRINGE FOR IV PUSH (FOR BLOOD PRESSURE SUPPORT)
PREFILLED_SYRINGE | INTRAVENOUS | Status: DC | PRN
  Administered 2019-09-12: 80 ug via INTRAVENOUS

## 2019-09-12 MED ORDER — ONDANSETRON HCL 4 MG/2ML IJ SOLN: 4.0000 mg | Freq: Once | INTRAMUSCULAR | Status: DC | PRN

## 2019-09-12 MED ORDER — CHLORHEXIDINE GLUCONATE 4 % EX LIQD: 60.0000 mL | Freq: Once | CUTANEOUS | Status: DC

## 2019-09-12 MED ORDER — ONDANSETRON HCL 4 MG/2ML IJ SOLN
INTRAMUSCULAR | Status: AC
  Filled 2019-09-12: qty 2

## 2019-09-12 MED ORDER — OXYCODONE-ACETAMINOPHEN 5-325 MG PO TABS: 1.0000 | ORAL_TABLET | ORAL | 0 refills | Status: DC | PRN

## 2019-09-12 MED ORDER — LIDOCAINE 2% (20 MG/ML) 5 ML SYRINGE
INTRAMUSCULAR | Status: DC | PRN
  Administered 2019-09-12: 50 mg via INTRAVENOUS

## 2019-09-12 MED ORDER — FENTANYL CITRATE (PF) 100 MCG/2ML IJ SOLN: 25.0000 ug | INTRAMUSCULAR | Status: DC | PRN

## 2019-09-12 MED ORDER — SODIUM CHLORIDE 0.9 % IV SOLN: INTRAVENOUS | Status: DC | PRN

## 2019-09-12 MED ORDER — CEFAZOLIN SODIUM-DEXTROSE 2-4 GM/100ML-% IV SOLN
2.0000 g | INTRAVENOUS | Status: AC
  Administered 2019-09-12: 2 g via INTRAVENOUS
  Filled 2019-09-12: qty 100

## 2019-09-12 MED ORDER — DEXAMETHASONE SODIUM PHOSPHATE 4 MG/ML IJ SOLN
INTRAMUSCULAR | Status: DC | PRN
  Administered 2019-09-12: 4 mg via INTRAVENOUS

## 2019-09-12 SURGICAL SUPPLY — 38 items
ARMBAND PINK RESTRICT EXTREMIT (MISCELLANEOUS) ×2 IMPLANT
CANISTER SUCT 3000ML PPV (MISCELLANEOUS) ×2 IMPLANT
CANNULA VESSEL 3MM 2 BLNT TIP (CANNULA) ×2 IMPLANT
CLIP VESOCCLUDE MED 6/CT (CLIP) ×2 IMPLANT
CLIP VESOCCLUDE SM WIDE 6/CT (CLIP) ×2 IMPLANT
COVER PROBE W GEL 5X96 (DRAPES) ×2 IMPLANT
COVER SURGICAL LIGHT HANDLE (MISCELLANEOUS) ×2 IMPLANT
COVER WAND RF STERILE (DRAPES) IMPLANT
DECANTER SPIKE VIAL GLASS SM (MISCELLANEOUS) ×2 IMPLANT
DERMABOND ADVANCED (GAUZE/BANDAGES/DRESSINGS) ×1
DERMABOND ADVANCED .7 DNX12 (GAUZE/BANDAGES/DRESSINGS) ×1 IMPLANT
DRAIN PENROSE 1/4X12 LTX STRL (WOUND CARE) IMPLANT
ELECT REM PT RETURN 9FT ADLT (ELECTROSURGICAL) ×2
ELECTRODE REM PT RTRN 9FT ADLT (ELECTROSURGICAL) ×1 IMPLANT
GLOVE BIO SURGEON STRL SZ 6.5 (GLOVE) ×6 IMPLANT
GLOVE BIO SURGEON STRL SZ7.5 (GLOVE) ×2 IMPLANT
GLOVE BIOGEL PI IND STRL 7.0 (GLOVE) ×2 IMPLANT
GLOVE BIOGEL PI INDICATOR 7.0 (GLOVE) ×2
GOWN STRL REUS W/ TWL LRG LVL3 (GOWN DISPOSABLE) ×4 IMPLANT
GOWN STRL REUS W/TWL LRG LVL3 (GOWN DISPOSABLE) ×4
HEMOSTAT SPONGE AVITENE ULTRA (HEMOSTASIS) IMPLANT
KIT BASIN OR (CUSTOM PROCEDURE TRAY) ×2 IMPLANT
KIT TURNOVER KIT B (KITS) ×2 IMPLANT
LOOP VESSEL MINI RED (MISCELLANEOUS) IMPLANT
NS IRRIG 1000ML POUR BTL (IV SOLUTION) ×2 IMPLANT
PACK CV ACCESS (CUSTOM PROCEDURE TRAY) ×2 IMPLANT
PAD ARMBOARD 7.5X6 YLW CONV (MISCELLANEOUS) ×4 IMPLANT
SLING ARM IMMOBILIZER LRG (SOFTGOODS) ×2 IMPLANT
SUT PROLENE 5 0 C 1 24 (SUTURE) IMPLANT
SUT PROLENE 6 0 CC (SUTURE) ×2 IMPLANT
SUT PROLENE 7 0 BV 1 (SUTURE) IMPLANT
SUT VIC AB 3-0 SH 27 (SUTURE) ×2
SUT VIC AB 3-0 SH 27X BRD (SUTURE) ×2 IMPLANT
SUT VIC AB 4-0 PS2 18 (SUTURE) ×6 IMPLANT
SUT VICRYL 4-0 PS2 18IN ABS (SUTURE) ×2 IMPLANT
TOWEL GREEN STERILE (TOWEL DISPOSABLE) ×2 IMPLANT
UNDERPAD 30X36 HEAVY ABSORB (UNDERPADS AND DIAPERS) ×2 IMPLANT
WATER STERILE IRR 1000ML POUR (IV SOLUTION) ×2 IMPLANT

## 2019-09-12 NOTE — Anesthesia Procedure Notes (Signed)
Procedure Name: MAC Date/Time: 09/12/2019 7:58 AM Performed by: Orlie Dakin, CRNA Pre-anesthesia Checklist: Patient identified, Emergency Drugs available, Suction available and Patient being monitored Oxygen Delivery Method: Simple face mask Preoxygenation: Pre-oxygenation with 100% oxygen Induction Type: IV induction Placement Confirmation: positive ETCO2

## 2019-09-12 NOTE — Interval H&P Note (Signed)
History and Physical Interval Note:  09/12/2019 7:27 AM  Taylor Willis  has presented today for surgery, with the diagnosis of COMPLICATION OF AVF.  The various methods of treatment have been discussed with the patient and family. After consideration of risks, benefits and other options for treatment, the patient has consented to  Procedure(s): SIDE BRANCH LIGATION (Left) REVISON OF LEFT ARM ARTERIOVENOUS FISTULA (Left) as a surgical intervention.  The patient's history has been reviewed, patient examined, no change in status, stable for surgery.  I have reviewed the patient's chart and labs.  Questions were answered to the patient's satisfaction.     Ruta Hinds

## 2019-09-12 NOTE — Anesthesia Procedure Notes (Signed)
Anesthesia Regional Block: Supraclavicular block   Pre-Anesthetic Checklist: ,, timeout performed, Correct Patient, Correct Site, Correct Laterality, Correct Procedure, Correct Position, site marked, Risks and benefits discussed,  Surgical consent,  Pre-op evaluation,  At surgeon's request and post-op pain management  Laterality: Left  Prep: Maximum Sterile Barrier Precautions used, chloraprep       Needles:  Injection technique: Single-shot  Needle Type: Echogenic Stimulator Needle     Needle Length: 9cm  Needle Gauge: 22     Additional Needles:   Procedures:,,,, ultrasound used (permanent image in chart),,,,  Narrative:  Start time: 09/12/2019 7:00 AM End time: 09/12/2019 7:05 AM Injection made incrementally with aspirations every 5 mL.  Performed by: Personally  Anesthesiologist: Pervis Hocking, DO  Additional Notes: Monitors applied. No increased pain on injection. No increased resistance to injection. Injection made in 5cc increments. Good needle visualization. Patient tolerated procedure well.

## 2019-09-12 NOTE — Transfer of Care (Signed)
Immediate Anesthesia Transfer of Care Note  Patient: Taylor Willis  Procedure(s) Performed: SIDE BRANCH LIGATION  OF LEFT ARM ARTERIOVENOUS FISTULA (Left Arm Upper) REVISON OF LEFT ARM ARTERIOVENOUS FISTULA (Left Arm Upper)  Patient Location: PACU  Anesthesia Type:MAC and Regional  Level of Consciousness: awake, oriented and patient cooperative  Airway & Oxygen Therapy: Patient Spontanous Breathing and Patient connected to face mask oxygen  Post-op Assessment: Report given to RN and Post -op Vital signs reviewed and stable  Post vital signs: Reviewed and stable  Last Vitals:  Vitals Value Taken Time  BP 134/59 09/12/19 0849  Temp    Pulse 62 09/12/19 0850  Resp 13 09/12/19 0850  SpO2 100 % 09/12/19 0850  Vitals shown include unvalidated device data.  Last Pain:  Vitals:   09/12/19 0654  TempSrc: Temporal  PainSc:       Patients Stated Pain Goal: 3 (34/03/70 9643)  Complications: No complications documented.

## 2019-09-12 NOTE — Discharge Instructions (Signed)
[facility]  Discharge Instructions  AV Fistula for Dialysis Access  Please refer to the following instructions for your post-procedure care. Your surgeon or physician assistant will discuss any changes with you.  Activity  You may drive the day following your surgery, if you are comfortable and no longer taking prescription pain medication. Resume full activity as the soreness in your incision resolves.  Bathing/Showering  You may shower after you go home. Keep your incision dry for 48 hours. Do not soak in a bathtub, hot tub, or swim until the incision heals completely. You may not shower if you have a hemodialysis catheter.  Incision Care  Clean your incision with mild soap and water after 48 hours. Pat the area dry with a clean towel. You do not need a bandage unless otherwise instructed. Do not apply any ointments or creams to your incision. You may have skin glue on your incision. Do not peel it off. It will come off on its own in about one week. Your arm may swell a bit after surgery. To reduce swelling use pillows to elevate your arm so it is above your heart. Your doctor will tell you if you need to lightly wrap your arm with an ACE bandage.  Diet  Resume your normal diet. There are not special food restrictions following this procedure. In order to heal from your surgery, it is CRITICAL to get adequate nutrition. Your body requires vitamins, minerals, and protein. Vegetables are the best source of vitamins and minerals. Vegetables also provide the perfect balance of protein. Processed food has little nutritional value, so try to avoid this.  Medications  Resume taking all of your medications. If your incision is causing pain, you may take over-the counter pain relievers such as acetaminophen (Tylenol). If you were prescribed a stronger pain medication, please be aware these medications can cause nausea and constipation. Prevent nausea by taking  the medication with a snack or meal. Avoid constipation by drinking plenty of fluids and eating foods with high amount of fiber, such as fruits, vegetables, and grains.   Do not take Tylenol if you are taking prescription pain medications.  Follow up Your surgeon may want to see you in the office following your access surgery. If so, this will be arranged at the time of your surgery.  Please call us immediately for any of the following conditions:  . Increased pain, redness, drainage (pus) from your incision site . Fever of 101 degrees or higher . Severe or worsening pain at your incision site . Hand pain or numbness. .  Reduce your risk of vascular disease:  . Stop smoking. If you would like help, call [facility] at 1-800-QUIT-NOW 978-329-5495) or [facility] at 213-850-0122  . Manage your cholesterol . Maintain a desired weight . Control your diabetes . Keep your blood pressure down  Dialysis  It will take several weeks to several months for your new dialysis access to be ready for use. Your surgeon will determine when it is okay to use it. Your nephrologist will continue to direct your dialysis. You can continue to use your Permcath until your new access is ready for use.   09/12/2019 Taylor Willis [id number] [birth year]  Surgeon(s): Fields, Jessy Oto, MD  Procedure(s): SIDE BRANCH LIGATION  OF LEFT ARM ARTERIOVENOUS FISTULA REVISON OF LEFT ARM ARTERIOVENOUS FISTULA   May stick graft immediately   May stick graft on designated area only:   X Do not stick left  AV Fistula for 4 weeks    If you have any questions, please call the office at [phone].

## 2019-09-12 NOTE — Op Note (Addendum)
Procedure: Ultrasound left upper extremity AV fistula, ligation of multiple side branches  Preoperative diagnosis: Nonmaturing AV fistula left arm  Postoperative diagnosis: Same  Assistant: Renne Musca, PA-C for assistance in exposure and expediting procedure  Anesthesia: Upper extremity block with local anesthesia  Operative findings: 3 discrete side branches ligated one was 4 mm in diameter the other was 3 mm in diameter last one was 1-1/2 mm in diameter, overall fistula size was about 5 to 6 mm.  Operative details: After pain informed consent, patient was taken to the operating room.  The patient was placed in supine position operating table.  After placement of a block by the anesthesia team the patient's left upper extremities prepped and draped in usual sterile fashion.  Local anesthesia was infiltrated over the area of 3 discrete side branches that were identified using ultrasound guidance.  I then proceeded to make an incision over each side branch.  The fistula was dissected free circumferentially at this level in all side branches in the area or ligated and divided tween silk ties.  There was a 1-1/2 mm branch of 4 mm branch and a 3 mm branch.  After ligating these branches there was still a palpable thrill within the fistula.  Ultrasound was then again used to inspect the fistula and other than some tortuosity it seems pretty developed with no other significant sidebranches.  At this point each wound was closed with a running 3-0 Vicryl in the subcutaneous layer and a 4-0 Vicryl subcuticular stitch in the skin and Dermabond was applied.  The patient tolerated procedure well and there were no complications.  The instrument sponge and needle counts were correct in the case.  Patient was taken to recovery in stable condition.  Ruta Hinds, MD [facility] [place] Office: [phone]

## 2019-09-12 NOTE — Anesthesia Postprocedure Evaluation (Signed)
Anesthesia Post Note  Patient: Taylor Willis  Procedure(s) Performed: SIDE BRANCH LIGATION  OF LEFT ARM ARTERIOVENOUS FISTULA (Left Arm Upper) REVISON OF LEFT ARM ARTERIOVENOUS FISTULA (Left Arm Upper)     Patient location during evaluation: PACU Anesthesia Type: Regional and MAC Level of consciousness: awake and alert Pain management: pain level controlled Vital Signs Assessment: post-procedure vital signs reviewed and stable Respiratory status: spontaneous breathing, nonlabored ventilation and respiratory function stable Cardiovascular status: blood pressure returned to baseline and stable Postop Assessment: no apparent nausea or vomiting Anesthetic complications: no   No complications documented.  Last Vitals:  Vitals:   09/12/19 0850 09/12/19 0905  BP: (!) 134/59 (!) 137/58  Pulse: 62 62  Resp: 13 13  Temp: 36.7 C 36.9 C  SpO2: 100% 99%    Last Pain:  Vitals:   09/12/19 0850  TempSrc:   PainSc: 0-No pain                 Pervis Hocking

## 2019-09-13 ENCOUNTER — Encounter (HOSPITAL_COMMUNITY): Payer: Self-pay | Admitting: Vascular Surgery

## 2019-10-03 ENCOUNTER — Other Ambulatory Visit: Payer: Self-pay | Admitting: *Deleted

## 2019-10-03 DIAGNOSIS — N186 End stage renal disease: Secondary | ICD-10-CM

## 2019-10-12 ENCOUNTER — Ambulatory Visit (INDEPENDENT_AMBULATORY_CARE_PROVIDER_SITE_OTHER): Payer: Self-pay | Admitting: Physician Assistant

## 2019-10-12 ENCOUNTER — Other Ambulatory Visit: Payer: Self-pay

## 2019-10-12 ENCOUNTER — Ambulatory Visit (HOSPITAL_COMMUNITY)
Admission: RE | Admit: 2019-10-12 | Discharge: 2019-10-12 | Disposition: A | Discharge status: Home / Self Care | Payer: Medicare Other | Source: Ambulatory Visit | Attending: Vascular Surgery | Admitting: Vascular Surgery

## 2019-10-12 DIAGNOSIS — N186 End stage renal disease: Secondary | ICD-10-CM | POA: Diagnosis present

## 2019-10-12 DIAGNOSIS — N189 Chronic kidney disease, unspecified: Secondary | ICD-10-CM

## 2019-10-12 DIAGNOSIS — N185 Chronic kidney disease, stage 5: Secondary | ICD-10-CM | POA: Insufficient documentation

## 2019-10-12 NOTE — Progress Notes (Signed)
    Postoperative Access Visit   History of Present Illness   Taylor Willis is a 79 y.o. year old male who presents for postoperative follow-up for: left brachiocephalic fistula revision with side branch ligation by Dr. Oneida Alar (Date: 09/12/19).  The patient's wounds are healed.  The patient denies steal symptoms.  The patient is able to complete their activities of daily living.  He is not yet on hemodialysis.  CKD is managed by Dr. Theador Hawthorne.     Physical Examination   Vitals:   10/12/19 1117  BP: (!) 142/63  Pulse: (!) 58  Resp: 20  Temp: 98.2 F (36.8 C)  TempSrc: Temporal  SpO2: 98%  Weight: 204 lb 9.6 oz (92.8 kg)  Height: 5\' 11"  (1.803 m)   Body mass index is 28.54 kg/m.  left arm Incisions are healed, palpable radial pulse, hand grip is 5/5, sensation in digits is intact, palpable thrill, bruit can be auscultated     Medical Decision Making   Taylor Willis is a 79 y.o. year old male who presents s/p left brachiocephalic fistula revision with side branch ligation   Patent L brachiocephalic fistula without signs or symptoms of steal syndrome  The patient's access is ready for use  The patient may follow up on a prn basis   Dagoberto Ligas PA-C [facility] [place] Office: [phone]  Clinic MD: Oneida Alar

## 2020-04-04 ENCOUNTER — Other Ambulatory Visit: Payer: Self-pay

## 2020-04-04 ENCOUNTER — Encounter (HOSPITAL_COMMUNITY): Payer: Self-pay | Admitting: Internal Medicine

## 2020-04-04 ENCOUNTER — Inpatient Hospital Stay (HOSPITAL_COMMUNITY)
Admission: AD | Admit: 2020-04-04 | Discharge: 2020-04-11 | DRG: 673 | Disposition: A | Discharge status: Skilled Nursing Facility | Payer: Medicare Other | Source: Other Acute Inpatient Hospital | Attending: Internal Medicine | Admitting: Internal Medicine

## 2020-04-04 DIAGNOSIS — E876 Hypokalemia: Secondary | ICD-10-CM | POA: Diagnosis present

## 2020-04-04 DIAGNOSIS — Y832 Surgical operation with anastomosis, bypass or graft as the cause of abnormal reaction of the patient, or of later complication, without mention of misadventure at the time of the procedure: Secondary | ICD-10-CM | POA: Diagnosis present

## 2020-04-04 DIAGNOSIS — N185 Chronic kidney disease, stage 5: Secondary | ICD-10-CM

## 2020-04-04 DIAGNOSIS — G9341 Metabolic encephalopathy: Secondary | ICD-10-CM | POA: Diagnosis present

## 2020-04-04 DIAGNOSIS — C61 Malignant neoplasm of prostate: Secondary | ICD-10-CM | POA: Diagnosis not present

## 2020-04-04 DIAGNOSIS — Z9079 Acquired absence of other genital organ(s): Secondary | ICD-10-CM | POA: Diagnosis not present

## 2020-04-04 DIAGNOSIS — Z8709 Personal history of other diseases of the respiratory system: Secondary | ICD-10-CM | POA: Diagnosis not present

## 2020-04-04 DIAGNOSIS — E785 Hyperlipidemia, unspecified: Secondary | ICD-10-CM | POA: Diagnosis present

## 2020-04-04 DIAGNOSIS — F05 Delirium due to known physiological condition: Secondary | ICD-10-CM | POA: Diagnosis present

## 2020-04-04 DIAGNOSIS — N2581 Secondary hyperparathyroidism of renal origin: Secondary | ICD-10-CM | POA: Diagnosis present

## 2020-04-04 DIAGNOSIS — J811 Chronic pulmonary edema: Secondary | ICD-10-CM | POA: Diagnosis present

## 2020-04-04 DIAGNOSIS — T82590A Other mechanical complication of surgically created arteriovenous fistula, initial encounter: Secondary | ICD-10-CM | POA: Diagnosis present

## 2020-04-04 DIAGNOSIS — J9601 Acute respiratory failure with hypoxia: Secondary | ICD-10-CM | POA: Diagnosis present

## 2020-04-04 DIAGNOSIS — R32 Unspecified urinary incontinence: Secondary | ICD-10-CM | POA: Diagnosis present

## 2020-04-04 DIAGNOSIS — D631 Anemia in chronic kidney disease: Secondary | ICD-10-CM | POA: Diagnosis not present

## 2020-04-04 DIAGNOSIS — I120 Hypertensive chronic kidney disease with stage 5 chronic kidney disease or end stage renal disease: Secondary | ICD-10-CM | POA: Diagnosis present

## 2020-04-04 DIAGNOSIS — J810 Acute pulmonary edema: Secondary | ICD-10-CM

## 2020-04-04 DIAGNOSIS — K219 Gastro-esophageal reflux disease without esophagitis: Secondary | ICD-10-CM | POA: Diagnosis not present

## 2020-04-04 DIAGNOSIS — Z8546 Personal history of malignant neoplasm of prostate: Secondary | ICD-10-CM

## 2020-04-04 DIAGNOSIS — Z992 Dependence on renal dialysis: Secondary | ICD-10-CM

## 2020-04-04 DIAGNOSIS — Z79899 Other long term (current) drug therapy: Secondary | ICD-10-CM

## 2020-04-04 DIAGNOSIS — J45909 Unspecified asthma, uncomplicated: Secondary | ICD-10-CM | POA: Diagnosis present

## 2020-04-04 DIAGNOSIS — E871 Hypo-osmolality and hyponatremia: Secondary | ICD-10-CM | POA: Diagnosis present

## 2020-04-04 DIAGNOSIS — E8770 Fluid overload, unspecified: Secondary | ICD-10-CM | POA: Diagnosis present

## 2020-04-04 DIAGNOSIS — Z8249 Family history of ischemic heart disease and other diseases of the circulatory system: Secondary | ICD-10-CM | POA: Diagnosis not present

## 2020-04-04 DIAGNOSIS — E872 Acidosis: Secondary | ICD-10-CM | POA: Diagnosis present

## 2020-04-04 DIAGNOSIS — N186 End stage renal disease: Secondary | ICD-10-CM | POA: Diagnosis present

## 2020-04-04 DIAGNOSIS — M109 Gout, unspecified: Secondary | ICD-10-CM | POA: Diagnosis present

## 2020-04-04 DIAGNOSIS — Z20822 Contact with and (suspected) exposure to covid-19: Secondary | ICD-10-CM | POA: Diagnosis present

## 2020-04-04 DIAGNOSIS — Z87891 Personal history of nicotine dependence: Secondary | ICD-10-CM

## 2020-04-04 LAB — COMPREHENSIVE METABOLIC PANEL
ALT: 36 U/L (ref 0–44)
AST: 28 U/L (ref 15–41)
Albumin: 3 g/dL — ABNORMAL LOW (ref 3.5–5.0)
Alkaline Phosphatase: 51 U/L (ref 38–126)
Anion gap: 17 — ABNORMAL HIGH (ref 5–15)
BUN: 103 mg/dL — ABNORMAL HIGH (ref 8–23)
CO2: 21 mmol/L — ABNORMAL LOW (ref 22–32)
Calcium: 8.4 mg/dL — ABNORMAL LOW (ref 8.9–10.3)
Chloride: 90 mmol/L — ABNORMAL LOW (ref 98–111)
Creatinine, Ser: 8.69 mg/dL — ABNORMAL HIGH (ref 0.61–1.24)
GFR, Estimated: 6 mL/min — ABNORMAL LOW (ref >60)
Glucose, Bld: 170 mg/dL — ABNORMAL HIGH (ref 70–99)
Potassium: 4.1 mmol/L (ref 3.5–5.1)
Sodium: 128 mmol/L — ABNORMAL LOW (ref 135–145)
Total Bilirubin: 0.9 mg/dL (ref 0.3–1.2)
Total Protein: 7 g/dL (ref 6.5–8.1)

## 2020-04-04 LAB — CBC
HCT: 22.2 % — ABNORMAL LOW (ref 39.0–52.0)
Hemoglobin: 7.3 g/dL — ABNORMAL LOW (ref 13.0–17.0)
MCH: 31.6 pg (ref 26.0–34.0)
MCHC: 32.9 g/dL (ref 30.0–36.0)
MCV: 96.1 fL (ref 80.0–100.0)
Platelets: 184 10*3/uL (ref 150–400)
RBC: 2.31 MIL/uL — ABNORMAL LOW (ref 4.22–5.81)
RDW: 13.5 % (ref 11.5–15.5)
WBC: 7.8 10*3/uL (ref 4.0–10.5)
nRBC: 0 % (ref 0.0–0.2)

## 2020-04-04 LAB — HEMOGLOBIN A1C
Hgb A1c MFr Bld: 5.9 % — ABNORMAL HIGH (ref 4.8–5.6)
Mean Plasma Glucose: 122.63 mg/dL

## 2020-04-04 LAB — MAGNESIUM: Magnesium: 2.4 mg/dL (ref 1.7–2.4)

## 2020-04-04 LAB — PHOSPHORUS: Phosphorus: 7.4 mg/dL — ABNORMAL HIGH (ref 2.5–4.6)

## 2020-04-04 LAB — MRSA PCR SCREENING: MRSA by PCR: NEGATIVE

## 2020-04-04 LAB — TSH: TSH: 1.028 u[IU]/mL (ref 0.350–4.500)

## 2020-04-04 MED ORDER — ALBUTEROL SULFATE HFA 108 (90 BASE) MCG/ACT IN AERS: 2.0000 | INHALATION_SPRAY | Freq: Four times a day (QID) | RESPIRATORY_TRACT | Status: DC | PRN

## 2020-04-04 MED ORDER — CALCIUM ACETATE (PHOS BINDER) 667 MG PO CAPS
667.0000 mg | ORAL_CAPSULE | Freq: Two times a day (BID) | ORAL | Status: DC
  Administered 2020-04-05 – 2020-04-11 (×11): 667 mg via ORAL
  Filled 2020-04-04 (×11): qty 1

## 2020-04-04 MED ORDER — HEPARIN SODIUM (PORCINE) 5000 UNIT/ML IJ SOLN
5000.0000 [IU] | Freq: Three times a day (TID) | INTRAMUSCULAR | Status: DC
  Administered 2020-04-04 – 2020-04-10 (×6): 5000 [IU] via SUBCUTANEOUS
  Filled 2020-04-04 (×7): qty 1

## 2020-04-04 MED ORDER — HEPARIN SODIUM (PORCINE) 1000 UNIT/ML DIALYSIS
1000.0000 [IU] | INTRAMUSCULAR | Status: DC | PRN
  Administered 2020-04-05: 3100 [IU] via INTRAVENOUS_CENTRAL

## 2020-04-04 MED ORDER — SODIUM CHLORIDE 0.9% FLUSH
3.0000 mL | Freq: Two times a day (BID) | INTRAVENOUS | Status: DC
  Administered 2020-04-04 – 2020-04-10 (×12): 3 mL via INTRAVENOUS

## 2020-04-04 MED ORDER — SODIUM CHLORIDE 0.9 % IV SOLN
250.0000 mL | INTRAVENOUS | Status: DC | PRN
  Administered 2020-04-09: 250 mL via INTRAVENOUS

## 2020-04-04 MED ORDER — CHLORHEXIDINE GLUCONATE CLOTH 2 % EX PADS
6.0000 | MEDICATED_PAD | Freq: Every day | CUTANEOUS | Status: DC
  Administered 2020-04-05 – 2020-04-10 (×7): 6 via TOPICAL

## 2020-04-04 MED ORDER — SODIUM CHLORIDE 0.9% FLUSH: 3.0000 mL | INTRAVENOUS | Status: DC | PRN

## 2020-04-04 MED ORDER — ACETAMINOPHEN 500 MG PO TABS
1000.0000 mg | ORAL_TABLET | Freq: Four times a day (QID) | ORAL | Status: DC | PRN
  Administered 2020-04-04 – 2020-04-05 (×3): 1000 mg via ORAL
  Filled 2020-04-04 (×3): qty 2

## 2020-04-04 MED ORDER — FUROSEMIDE 10 MG/ML IJ SOLN
160.0000 mg | Freq: Once | INTRAVENOUS | Status: AC
  Administered 2020-04-04: 160 mg via INTRAVENOUS
  Filled 2020-04-04: qty 16

## 2020-04-04 MED ORDER — ALTEPLASE 2 MG IJ SOLR
2.0000 mg | Freq: Once | INTRAMUSCULAR | Status: DC | PRN
  Filled 2020-04-04: qty 2

## 2020-04-04 MED ORDER — VITAMIN B-12 1000 MCG PO TABS
1000.0000 ug | ORAL_TABLET | Freq: Every day | ORAL | Status: DC
  Administered 2020-04-05 – 2020-04-11 (×6): 1000 ug via ORAL
  Filled 2020-04-04 (×7): qty 1

## 2020-04-04 MED ORDER — SODIUM BICARBONATE 650 MG PO TABS
650.0000 mg | ORAL_TABLET | Freq: Three times a day (TID) | ORAL | Status: DC
  Administered 2020-04-04 (×2): 650 mg via ORAL
  Filled 2020-04-04 (×3): qty 1

## 2020-04-04 MED ORDER — ONDANSETRON HCL 4 MG/2ML IJ SOLN
4.0000 mg | Freq: Four times a day (QID) | INTRAMUSCULAR | Status: DC | PRN
  Administered 2020-04-05: 4 mg via INTRAVENOUS
  Filled 2020-04-04: qty 2

## 2020-04-04 MED ORDER — CARVEDILOL 3.125 MG PO TABS
3.1250 mg | ORAL_TABLET | Freq: Two times a day (BID) | ORAL | Status: DC
  Administered 2020-04-05 – 2020-04-07 (×5): 3.125 mg via ORAL
  Filled 2020-04-04 (×5): qty 1

## 2020-04-04 MED ORDER — SODIUM CHLORIDE 0.9 % IV SOLN: 100.0000 mL | INTRAVENOUS | Status: DC | PRN

## 2020-04-04 MED ORDER — DARBEPOETIN ALFA 60 MCG/0.3ML IJ SOSY
60.0000 ug | PREFILLED_SYRINGE | INTRAMUSCULAR | Status: DC
  Administered 2020-04-05: 60 ug via INTRAVENOUS
  Filled 2020-04-04 (×2): qty 0.3

## 2020-04-04 MED ORDER — FUROSEMIDE 10 MG/ML IJ SOLN
60.0000 mg | Freq: Two times a day (BID) | INTRAMUSCULAR | Status: DC
  Administered 2020-04-04 (×2): 60 mg via INTRAVENOUS
  Filled 2020-04-04 (×3): qty 6

## 2020-04-04 MED ORDER — ONDANSETRON HCL 4 MG PO TABS: 4.0000 mg | ORAL_TABLET | Freq: Four times a day (QID) | ORAL | Status: DC | PRN

## 2020-04-04 NOTE — Progress Notes (Signed)
Respiratory Pathogen Panel with COVID-19 (04/04/2020 3:56 AM EST) Respiratory Pathogen Panel with COVID-19 (04/04/2020 3:56 AM EST)  Component Value Ref Range Performed At Pathologist Signature  Adenovirus Not Detected Not Detected [facility] LABORAT[facility]   Coronavirus PCR (229E, HKU1, NL63, OC43) Not Detected Not Detected [place] Not Detected Not Detected [place]   Rhinovirus/Enterovirus Not Detected Not Detected [facility] LABORATORY   Influenza A Not Detected Not Detected [facility]AT[facility]   Influenza A/H1 Not Detected Not Detected [facility] LAB[facility]AT[facility]   Influenza A/H3 Not Detected Not Detected [facility] LAB[facility]ATORY   Influenza A/H1-2009 Not Detected Not Detected [facility] LABORATORY   Influenza B Not Detected Not Detected [place]   Parainfluenza 1 Not Detected Not Detected [place]   Parainfluenza 2 Not Detected Not Detected [place]   Parainfluenza 3 Not Detected Not Detected [place]   Parainfluenza 4 Not Detected Not Detected [place]   SARS-CoV-2 PCR Not Detected Not Detected [place]   RSV A PCR Not Detected Not Detected [place]   RSV B PCR Not Detected Not Detected [place]   Chlamydophila (Chlamydia) pneumoniae Not Detected Not Detected [facility] LABORATORY   Mycoplasma pneumoniae Not Detected Not Detected [place]    Respiratory Pathogen Panel with COVID-19 (04/04/2020 3:56 AM EST)  Specimen  Nasopharyngeal Swab - Nasopharyngeal structure (body structure)

## 2020-04-04 NOTE — Progress Notes (Signed)
Patient is currently getting Dialysis at bedside.  Checked patient, sat is 99% on [place] HFNC at 5L.  Bipap is at bedside PRN.  Will continue to monitor but needed at this time.

## 2020-04-04 NOTE — H&P (Signed)
History and Physical    Taylor Willis NUU:[id number] DOB: 08/22/1940 DOA: 04/04/2020  PCP: Deloria Lair., MD   Patient coming from: Home  I have personally briefly reviewed patient's old medical records in [place]  Chief Complaint: Shortness of breath and orthopnea  HPI: Taylor Willis is a 80 y.o. male with medical history significant of prostate cancer, history of asthma clinically disease stage V (close to require hemodialysis as deformity reports), hyperlipidemia, hypertension and gout; who presented to the hospital secondary to shortness of breath, hypoxia, orthopnea and decreasing urine output.  Patient actively Followed by Dr. August Albino as an outpatient for his renal failure and looking to start HD on 04/04/20.  Unfortunately since Sunday he has experienced ongoing progressive shortness of breath, dyspnea on exertion and orthopnea.  He presented to [facility] where he was found to have pulmonary edema on chest x-ray, hypoxic in the mid 80s on room air with requirement for BiPAP initiation.  Patient denies fever, chills, hemoptysis, dysuria, hematuria, chest pain, nausea, vomiting, sick contacts or any other complaints.  Covid screening test done at [facility] negative.  His blood work demonstrated metabolic acidosis, BNP more than 35,000 and worsening creatinine/BUN levels.  His hemoglobin decreased to 7.7 from 9's range on previous blood work. No overt bleeding reported.  ED Course: Patient was started on BiPAP and transfer to antipain the hospital stepdown bed for further evaluation, management and dialysis initiation.  Review of Systems: As per HPI otherwise all other systems reviewed and are negative.   Past Medical History:  Diagnosis Date  . Arthritis    gout  . Asthma    has rescue inhaler  . Cancer [facility])    prostate  . Chronic kidney disease   . GERD (gastroesophageal reflux disease)   . H/O peptic ulcer 1970's  . Hyperlipidemia   . Hypertension   .  Ureteral stone   . Wears dentures   . Wears glasses     Past Surgical History:  Procedure Laterality Date  . AV FISTULA PLACEMENT Left 02/15/2019   Procedure: ARTERIOVENOUS (AV) FISTULA CREATION LEFT ARM;  Surgeon: Rosetta Posner, MD;  Location: [place];  Service: Vascular;  Laterality: Left;  . CATARACT EXTRACTION W/ INTRAOCULAR LENS  IMPLANT, BILATERAL    . CYSTOSCOPY/RETROGRADE/URETEROSCOPY/STONE EXTRACTION WITH BASKET Right 05/23/2012   Procedure: CYSTOSCOPY/RETROGRADE/URETEROSCOPY/STONE EXTRACTION WITH BASKET;  Surgeon: Bernestine Amass, MD;  Location[facility];  Service: Urology;  Laterality: Right;  . FRACTURE SURGERY Right    as a child  . HERNIA REPAIR    . HOLMIUM LASER APPLICATION Right 3/47/4259   Procedure: HOLMIUM LASER APPLICATION;  Surgeon: Bernestine Amass, MD;  Location: [facility];  Service: Urology;  Laterality: Right;  . LIGATION OF ARTERIOVENOUS  FISTULA Left 09/12/2019   Procedure: SIDE BRANCH LIGATION  OF LEFT ARM ARTERIOVENOUS FISTULA;  Surgeon: Elam Dutch, MD;  Location: [place];  Service: Vascular;  Laterality: Left;  Marland Kitchen MULTIPLE TOOTH EXTRACTIONS    . REVISON OF ARTERIOVENOUS FISTULA Left 09/12/2019   Procedure: REVISON OF LEFT ARM ARTERIOVENOUS FISTULA;  Surgeon: Elam Dutch, MD;  Location: [place];  Service: Vascular;  Laterality: Left;  . ROBOT ASSISTED LAPAROSCOPIC RADICAL PROSTATECTOMY  2011  . TONSILLECTOMY      Social History  reports that he quit smoking about 37 years ago. His smoking use included cigarettes. He has never used smokeless tobacco. He reports previous alcohol use of about 1.0 standard  drink of alcohol per week. He reports that he does not use drugs.  Allergies  Allergen Reactions  . Aspirin Other (See Comments)    GI irritation   . Hydrochlorothiazide     Dizziness and unable to walk  . Ibuprofen Other (See Comments)    GI irritation     Family history: -Hypertension and cholesterol; otherwise  noncontributory.  Prior to Admission medications   Medication Sig Start Date End Date Taking? Authorizing Provider  acetaminophen (TYLENOL) 500 MG tablet Take 1,000 mg by mouth every 6 (six) hours as needed for mild pain or headache.    [provider]  albuterol (PROVENTIL HFA;VENTOLIN HFA) 108 (90 BASE) MCG/ACT inhaler Inhale 2 puffs into the lungs every 6 (six) hours as needed for wheezing.    [provider]  allopurinol (ZYLOPRIM) 100 MG tablet Take 100 mg by mouth daily.    [provider]  amLODipine (NORVASC) 10 MG tablet Take 10 mg by mouth daily.  12/22/18   [provider]  calcium acetate (PHOSLO) 667 MG capsule Take 667 mg by mouth 2 (two) times daily with a meal.    [provider]  carvedilol (COREG) 3.125 MG tablet Take 3.125 mg by mouth 2 (two) times daily with a meal.    [provider]  Cholecalciferol 25 MCG (1000 UT) tablet Take 1,000 Units by mouth daily.     [provider]  Cinnamon 500 MG capsule Take 500 mg by mouth daily.    [provider]  Cranberry 250 MG CAPS Take 500 mg by mouth daily.     [provider]  furosemide (LASIX) 40 MG tablet Take 40 mg by mouth.    [provider]  oxyCODONE-acetaminophen (PERCOCET) 5-325 MG tablet Take 1 tablet by mouth every 4 (four) hours as needed for severe pain. 09/12/19 09/11/20  Baglia, Corrina, PA-C  sodium bicarbonate 650 MG tablet Take 650 mg by mouth 3 (three) times daily.    [provider]  vitamin B-12 (CYANOCOBALAMIN) 1000 MCG tablet Take 1,000 mcg by mouth daily.    [provider]    Physical Exam: Vitals:   04/04/20 0930 04/04/20 0932  BP:  125/65  Pulse: 79 79  Resp: (!) 21 19  Temp: 97.7 F (36.5 C)   TempSrc: Axillary   SpO2: 100% 98%  Weight: 92.2 kg   Height: 5\' 11"  (1.803 m)     Constitutional: Afebrile, no chest pain, no nausea, no vomiting.  Patient comfortable with a stable vital signs and  saturation while receiving BiPAP. Vitals:   04/04/20 0930 04/04/20 0932  BP:  125/65  Pulse: 79 79  Resp: (!) 21 19  Temp: 97.7 F (36.5 C)   TempSrc: Axillary   SpO2: 100% 98%  Weight: 92.2 kg   Height: 5\' 11"  (1.803 m)    Eyes: PERRL, lids and conjunctivae normal; no icterus, no nystagmus. ENMT: Mucous membranes are dry.. Posterior pharynx clear of any exudate or lesions. Neck: normal, supple, no masses, no thyromegaly; no JVD on exam. Respiratory: Positive crackles appreciated bilaterally; no wheezing, no using accessory muscle.  No tachypnea or labored breathing while on BiPAP. Cardiovascular: Regular rate and rhythm, no murmurs, no gallops, no gallops, no JVD. Abdomen: no tenderness, no masses palpated. No hepatosplenomegaly. Bowel sounds positive.  Musculoskeletal: no clubbing / cyanosis.  Trace to 1+ lower extremity edema appreciated bilaterally. Skin: no rashes, no petechiae. Neurologic: CN 2-12 grossly intact. Sensation intact, DTR normal. Strength 5/5 in  all 4.  Psychiatric: Normal judgment and insight. Alert and oriented x 3. Normal mood.   Labs on Admission: I have personally reviewed following labs and imaging studies  Basic Metabolic Panel: No results for input(s): NA, K, CL, CO2, GLUCOSE, BUN, CREATININE, CALCIUM, MG, PHOS in the last 168 hours.  GFR: CrCl cannot be calculated (Patient's most recent lab result is older than the maximum 21 days allowed.).  Liver Function Tests: No results for input(s): AST, ALT, ALKPHOS, BILITOT, PROT, ALBUMIN in the last 168 hours.  Urine analysis: No results found for: COLORURINE, APPEARANCEUR, LABSPEC, PHURINE, GLUCOSEU, HGBUR, BILIRUBINUR, KETONESUR, PROTEINUR, UROBILINOGEN, NITRITE, LEUKOCYTESUR  Radiological Exams on Admission: No results found.  Assessment/Plan 1-Acute respiratory failure with hypoxia (HCC) -In the setting of pulmonary edema and vascular congestion -Continue BiPAP -Lasix IV to further assist with his  condition while dialysis is initiated will be attempted. -Nephrology service has been consulted and will follow recommendations for dialysis initiation. -Renal diet with fluid restriction and daily weights requested. 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 -Most likely delusional in the setting of fluid overload  4-hypertension -Normotensive and is stable -Follow vital signs -Continue current antihypertensive agents for now  5-GERD (gastroesophageal reflux disease) -Continue PPI  6-history of prostate cancer [facility]) -Patient reported to take information -Continue outpatient follow-up with urology/oncology service.  7-hyponatremia/hypervolemia -Most likely delusional in the setting of thyroid "continue to follow electrolytes trend after dialysis.    8-History of asthma -No wheezing -Continue as needed bronchodilators.  9-secondary hyperparathyroidism -Continue PhosLo  DVT prophylaxis: Heparin Code Status:   Full code Family Communication:  Wife at bedside. Disposition Plan:   Patient is from:  Home  Anticipated DC to:  Home  Anticipated DC date:  3 days or so  Anticipated DC barriers: New hemodialysis initiation and stability of acute respiratory failure with hypoxia.  Consults called:  Nephrology service [facility]). Admission status:  Stepdown, inpatient, length of stay more than 2 midnights.  Severity of Illness: Moderate to severe severity; patient presented with acute respiratory failure with hypoxia in the setting of pulmonary edema and vascular congestion.  He has chronic kidney disease a stage size transitioning to end-stage renal disease dialysis  dependent.  Requiring BiPAP on presentation and will follow nephrology service recommendations for dialysis initiation.   Barton Dubois MD [facility]  How to contact the [facility] Attending or Consulting provider [place] or covering provider during after hours [place], for this patient?   1. Check the care team in [facility] and look for a) attending/consulting [facility] provider listed and b) the [facility] team listed 2. Log into [email or web address] and use [facility]'s universal password to access. If you do not have the password, please contact the hospital operator. 3. Locate the [facility] provider you are looking for under [facility] and page to a number that you can be directly reached. 4. If you still have difficulty reaching the provider, please page the [facility] (Director on Call) for the Hospitalists listed on amion for assistance.  04/04/2020, 10:58 AM

## 2020-04-04 NOTE — Plan of Care (Signed)

## 2020-04-04 NOTE — Progress Notes (Signed)
Patient direct admitted [facility]. Transferred by [facility] on bi-pap. Vitals stable. Placed on bipap in room, placed on monitors, CHG completed. Patient states he has frequent incontinent episodes and wears depends adult briefs. Offered to place a condom catheter, patient accepted. MD notified of arrival. Patient awaiting for dialysis run today

## 2020-04-04 NOTE — Progress Notes (Signed)
   NEPHROLOGY NURSING NOTE:  Failed cannulation of tortuous AVF.  Arterial end was accessed with ease near [facility] space, however three attempts to cannulate for venous return were unsuccessful.   Dr. Constance Haw has been consulted for femoral catheter placement so patient can be dialyzed today.  Rockwell Alexandria, RN

## 2020-04-04 NOTE — Progress Notes (Signed)
No urine output noted after giving 60mg  lasix this am. Waiting for pharmacy for delivery of 160mg  dose

## 2020-04-04 NOTE — Procedures (Signed)
Procedure Note  04/04/20    Preoperative Diagnosis: End stage renal disease, malfunctioning AVF    Postoperative Diagnosis: Same   Procedure(s) Performed: Temporary dialysis line placement, right femoral    Surgeon: Lanell Matar. Constance Haw, MD   Assistants: None   Anesthesia: 1% lidocaine    Complications: None    Indications: Taylor Willis is a 80 y.o. with ESRD who needs to start dialysis and had a fistula that did not work.  I discussed the risk and benefits of placement of the central line with him, including but not limited to bleeding, infection, and risk of injury to the vessels. He has given consent for the procedure.    Procedure: The patient placed supine. The right groin was prepped and draped in the usual sterile fashion.  Wearing full gown and gloves, I performed the procedure.  One percent lidocaine was used for local anesthesia. An ultrasound was utilized to assess the femoral vein.  The needle with syringe was advanced into the vein with dark venous return, and a wire was placed using the Seldinger technique without difficulty.  The skin was knicked and a dilator was placed, and the dual lumen catheter was placed over the wire with continued control of the wire.  There was good draw back of blood from all lumens and each flushed easily with saline. Heparin was used to pack the catheter.  The catheter was secured in 2 points with 2-0 silk and a biopatch and dressing was placed.     The patient tolerated the procedure well. The dialysis RN was made aware that the procedure was complete.   Curlene Labrum, MD [facility] [address] [place] [address] [place],  [address] [phone] (office)

## 2020-04-04 NOTE — Procedures (Signed)
   HEMODIALYSIS TREATMENT NOTE (HD#1):  Appreciate Dr. Constance Haw placing right femoral temporary catheter this evening.  Cath tolerates prescribed flow with stable pressures.  Pt off Bi-PAP, saturating 97% on 5L O2 via HFNC.  First ever HD session completed without problems.  2 hours, Qb 200 / Qd 500.  Goal met: 1.5 liters removed.  Hemodynamically stable throughout session.  All blood was returned.  Rockwell Alexandria, RN

## 2020-04-04 NOTE — Consult Note (Signed)
[place]  Reason for Consultation: pulmonary edema, new ESRD Requesting Provider: Dr. Dyann Kief  HPI: Taylor Willis is an 80 y.o. male with GERD, HTN, HL, gout, h/o prostate cancer and CKD 5 now ESRD who nephrology is consulted for evaluation and management of pulmonary edema in setting of new ESRD.   Pt follows with Dr. Alexander Mt for CKD 5 and was supposed to start dialysis today.  Since Sunday he's had progressive dyspnea which led to ED visit at [facility] overnight where he was found to have pulmonary edema on CXR, requiring bipap.  He was transported to [facility] for dialysis services.   His wife is at the bedside.  He's had progressive dyspnea and orthopnea.  Not on home O2.  No fevers, chills.  Urinary incontinence since prostate surgery several years ago, no new symptoms.  No chest pain.    PMH: Past Medical History:  Diagnosis Date  . Arthritis    gout  . Asthma    has rescue inhaler  . Cancer [facility])    prostate  . Chronic kidney disease   . GERD (gastroesophageal reflux disease)   . H/O peptic ulcer 1970's  . Hyperlipidemia   . Hypertension   . Ureteral stone   . Wears dentures   . Wears glasses    PSH: Past Surgical History:  Procedure Laterality Date  . AV FISTULA PLACEMENT Left 02/15/2019   Procedure: ARTERIOVENOUS (AV) FISTULA CREATION LEFT ARM;  Surgeon: Rosetta Posner, MD;  Location: [place];  Service: Vascular;  Laterality: Left;  . CATARACT EXTRACTION W/ INTRAOCULAR LENS  IMPLANT, BILATERAL    . CYSTOSCOPY/RETROGRADE/URETEROSCOPY/STONE EXTRACTION WITH BASKET Right 05/23/2012   Procedure: CYSTOSCOPY/RETROGRADE/URETEROSCOPY/STONE EXTRACTION WITH BASKET;  Surgeon: Bernestine Amass, MD;  Location: [facility];  Service: Urology;  Laterality: Right;  . FRACTURE SURGERY Right    as a child  . HERNIA REPAIR    . HOLMIUM LASER APPLICATION Right 2/72/5366   Procedure: HOLMIUM LASER APPLICATION;  Surgeon: Bernestine Amass, MD;   Location: [facility];  Service: Urology;  Laterality: Right;  . LIGATION OF ARTERIOVENOUS  FISTULA Left 09/12/2019   Procedure: SIDE BRANCH LIGATION  OF LEFT ARM ARTERIOVENOUS FISTULA;  Surgeon: Elam Dutch, MD;  Location: [place];  Service: Vascular;  Laterality: Left;  Marland Kitchen MULTIPLE TOOTH EXTRACTIONS    . REVISON OF ARTERIOVENOUS FISTULA Left 09/12/2019   Procedure: REVISON OF LEFT ARM ARTERIOVENOUS FISTULA;  Surgeon: Elam Dutch, MD;  Location: [place];  Service: Vascular;  Laterality: Left;  . ROBOT ASSISTED LAPAROSCOPIC RADICAL PROSTATECTOMY  2011  . TONSILLECTOMY      Past Medical History:  Diagnosis Date  . Arthritis    gout  . Asthma    has rescue inhaler  . Cancer [facility])    prostate  . Chronic kidney disease   . GERD (gastroesophageal reflux disease)   . H/O peptic ulcer 1970's  . Hyperlipidemia   . Hypertension   . Ureteral stone   . Wears dentures   . Wears glasses     Medications:  I have reviewed the patient's current medications.  Medications Prior to Admission  Medication Sig Dispense Refill  . acetaminophen (TYLENOL) 500 MG tablet Take 1,000 mg by mouth every 6 (six) hours as needed for mild pain or headache.    . albuterol (PROVENTIL HFA;VENTOLIN HFA) 108 (90 BASE) MCG/ACT inhaler Inhale 2 puffs into the lungs every 6 (six) hours as needed for wheezing.    Marland Kitchen  allopurinol (ZYLOPRIM) 100 MG tablet Take 100 mg by mouth daily.    Marland Kitchen amLODipine (NORVASC) 10 MG tablet Take 10 mg by mouth daily.     . calcium acetate (PHOSLO) 667 MG capsule Take 667 mg by mouth 2 (two) times daily with a meal.    . carvedilol (COREG) 3.125 MG tablet Take 3.125 mg by mouth 2 (two) times daily with a meal.    . Cholecalciferol 25 MCG (1000 UT) tablet Take 1,000 Units by mouth daily.     . Cinnamon 500 MG capsule Take 500 mg by mouth daily.    . Cranberry 250 MG CAPS Take 500 mg by mouth daily.     . furosemide (LASIX) 40 MG tablet Take 40 mg by mouth.    .  oxyCODONE-acetaminophen (PERCOCET) 5-325 MG tablet Take 1 tablet by mouth every 4 (four) hours as needed for severe pain. 6 tablet 0  . sodium bicarbonate 650 MG tablet Take 650 mg by mouth 3 (three) times daily.    . vitamin B-12 (CYANOCOBALAMIN) 1000 MCG tablet Take 1,000 mcg by mouth daily.      ALLERGIES:   Allergies  Allergen Reactions  . Aspirin Other (See Comments)    GI irritation   . Hydrochlorothiazide     Dizziness and unable to walk  . Ibuprofen Other (See Comments)    GI irritation     FAM HX: History reviewed. No pertinent family history.  Social History:   reports that he quit smoking about 37 years ago. His smoking use included cigarettes. He has never used smokeless tobacco. He reports previous alcohol use of about 1.0 standard drink of alcohol per week. He reports that he does not use drugs.  ROS: 12 system ROS neg except per HPI  Blood pressure 125/65, pulse 76, temperature (!) 97.5 F (36.4 C), temperature source Axillary, resp. rate 18, height 5\' 11"  (1.803 m), weight 92.2 kg, SpO2 97 %. PHYSICAL EXAM: Gen: elderly man on bipap appearing in no distress  Eyes:  anicteric ENT: dry on bipap Neck: ^ JVD at 45 degrees to jaw CV:  No rub, RRR  Abd:  Soft, nontender Lungs: ON bipap with O2 sats high 90s.  Dec BS bases, clear ant GU: condom cath with yellow urine in bad Extr: 1+ RLE edema, trace on L leg; L arm BC AVF is very tortuous with good thrill and bruit Neuro: alert and conversant but limited by bipap Skin: warm and dry   Results for orders placed or performed during the hospital encounter of 04/04/20 (from the past 48 hour(s))  CBC     Status: Abnormal   Collection Time: 04/04/20 11:21 AM  Result Value Ref Range   WBC 7.8 4.0 - 10.5 K/uL   RBC 2.31 (L) 4.22 - 5.81 MIL/uL   Hemoglobin 7.3 (L) 13.0 - 17.0 g/dL   HCT 22.2 (L) 39.0 - 52.0 %   MCV 96.1 80.0 - 100.0 fL   MCH 31.6 26.0 - 34.0 pg   MCHC 32.9 30.0 - 36.0 g/dL   RDW 13.5 11.5 - 15.5 %    Platelets 184 150 - 400 K/uL   nRBC 0.0 0.0 - 0.2 %    Comment: Performed at [facility], [address] [place], [place] [address]  Magnesium     Status: None   Collection Time: 04/04/20 11:21 AM  Result Value Ref Range   Magnesium 2.4 1.7 - 2.4 mg/dL    Comment: Performed at [facility], [address] [place], [place] [address]  Phosphorus     Status: Abnormal   Collection Time: 04/04/20 11:21 AM  Result Value Ref Range   Phosphorus 7.4 (H) 2.5 - 4.6 mg/dL    Comment: Performed at [facility], [address], [place], [place] [address]  Comprehensive metabolic panel     Status: Abnormal   Collection Time: 04/04/20 11:21 AM  Result Value Ref Range   Sodium 128 (L) 135 - 145 mmol/L   Potassium 4.1 3.5 - 5.1 mmol/L   Chloride 90 (L) 98 - 111 mmol/L   CO2 21 (L) 22 - 32 mmol/L   Glucose, Bld 170 (H) 70 - 99 mg/dL    Comment: Glucose reference range applies only to samples taken after fasting for at least 8 hours.   BUN 103 (H) 8 - 23 mg/dL   Creatinine, Ser 8.69 (H) 0.61 - 1.24 mg/dL   Calcium 8.4 (L) 8.9 - 10.3 mg/dL   Total Protein 7.0 6.5 - 8.1 g/dL   Albumin 3.0 (L) 3.5 - 5.0 g/dL   AST 28 15 - 41 U/L   ALT 36 0 - 44 U/L   Alkaline Phosphatase 51 38 - 126 U/L   Total Bilirubin 0.9 0.3 - 1.2 mg/dL   GFR, Estimated 6 (L) >60 mL/min    Comment: (NOTE) Calculated using the CKD-EPI Creatinine Equation (2021)    Anion gap 17 (H) 5 - 15    Comment: Performed at [facility], [address] [place], [place] [address]  TSH     Status: None   Collection Time: 04/04/20 11:21 AM  Result Value Ref Range   TSH 1.028 0.350 - 4.500 uIU/mL    Comment: Performed by a 3rd Generation assay with a functional sensitivity of <=0.01 uIU/mL. Performed at [facility], [address], [place], [place] [address]     No results found.  Assessment/Plan **Acute hypoxic resp failure:  Secondary to pulmonary edema per CXR.   HD now with UF.  If unable to UF adequately or still vol OL post HD  will attempt high dose diuretic use.   **ESRD: new.  LUE AVF is tortuous - examined with RN and will attempt 17g needle cannulation x 2.  If not successful will c/s surgery for dialysis catheter placement.  HD #1 today 2hrs, plan next tomorrow.  Max UF.   Outpatient HD is already arranged.   **Secondary hyperPTH:  Cont outpt phoslo.  Check PTH  **HTN:  Normotensive, cont home meds and follow with UF.    **Anemia:   Hb 7.3.  Check iron indices.  Start ESA while inpatient.   **Hyponatremia, hypervolemic: na 128, will correct with diuresis and HD.   Justin Mend 04/04/2020, 1:08 PM

## 2020-04-05 DIAGNOSIS — K219 Gastro-esophageal reflux disease without esophagitis: Secondary | ICD-10-CM | POA: Diagnosis not present

## 2020-04-05 DIAGNOSIS — Z8709 Personal history of other diseases of the respiratory system: Secondary | ICD-10-CM | POA: Diagnosis not present

## 2020-04-05 DIAGNOSIS — J9601 Acute respiratory failure with hypoxia: Secondary | ICD-10-CM | POA: Diagnosis not present

## 2020-04-05 DIAGNOSIS — N185 Chronic kidney disease, stage 5: Secondary | ICD-10-CM | POA: Diagnosis not present

## 2020-04-05 LAB — HEPATITIS B SURFACE ANTIGEN: Hepatitis B Surface Ag: NONREACTIVE

## 2020-04-05 LAB — CBC
HCT: 22.3 % — ABNORMAL LOW (ref 39.0–52.0)
Hemoglobin: 7.4 g/dL — ABNORMAL LOW (ref 13.0–17.0)
MCH: 31.8 pg (ref 26.0–34.0)
MCHC: 33.2 g/dL (ref 30.0–36.0)
MCV: 95.7 fL (ref 80.0–100.0)
Platelets: 204 10*3/uL (ref 150–400)
RBC: 2.33 MIL/uL — ABNORMAL LOW (ref 4.22–5.81)
RDW: 13.6 % (ref 11.5–15.5)
WBC: 8.8 10*3/uL (ref 4.0–10.5)
nRBC: 0 % (ref 0.0–0.2)

## 2020-04-05 LAB — RENAL FUNCTION PANEL
Albumin: 3.3 g/dL — ABNORMAL LOW (ref 3.5–5.0)
Anion gap: 15 (ref 5–15)
BUN: 87 mg/dL — ABNORMAL HIGH (ref 8–23)
CO2: 22 mmol/L (ref 22–32)
Calcium: 8.7 mg/dL — ABNORMAL LOW (ref 8.9–10.3)
Chloride: 92 mmol/L — ABNORMAL LOW (ref 98–111)
Creatinine, Ser: 7.49 mg/dL — ABNORMAL HIGH (ref 0.61–1.24)
GFR, Estimated: 7 mL/min — ABNORMAL LOW (ref >60)
Glucose, Bld: 149 mg/dL — ABNORMAL HIGH (ref 70–99)
Phosphorus: 7.1 mg/dL — ABNORMAL HIGH (ref 2.5–4.6)
Potassium: 4 mmol/L (ref 3.5–5.1)
Sodium: 129 mmol/L — ABNORMAL LOW (ref 135–145)

## 2020-04-05 LAB — HEPATITIS B SURFACE ANTIBODY,QUALITATIVE: Hep B S Ab: REACTIVE — AB

## 2020-04-05 LAB — IRON AND TIBC
Iron: 48 ug/dL (ref 45–182)
Saturation Ratios: 17 % — ABNORMAL LOW (ref 17.9–39.5)
TIBC: 278 ug/dL (ref 250–450)
UIBC: 230 ug/dL

## 2020-04-05 LAB — HEMOGLOBIN AND HEMATOCRIT, BLOOD
HCT: 23.7 % — ABNORMAL LOW (ref 39.0–52.0)
Hemoglobin: 7.9 g/dL — ABNORMAL LOW (ref 13.0–17.0)

## 2020-04-05 LAB — PTH, INTACT AND CALCIUM
Calcium, Total (PTH): 8.9 mg/dL (ref 8.6–10.2)
PTH: 133 pg/mL — ABNORMAL HIGH (ref 15–65)

## 2020-04-05 LAB — FERRITIN: Ferritin: 271 ng/mL (ref 24–336)

## 2020-04-05 LAB — HEPATITIS B CORE ANTIBODY, TOTAL: Hep B Core Total Ab: NONREACTIVE

## 2020-04-05 MED ORDER — SODIUM CHLORIDE 0.9 % IV SOLN
125.0000 mg | INTRAVENOUS | Status: DC
  Administered 2020-04-05 – 2020-04-11 (×4): 125 mg via INTRAVENOUS
  Filled 2020-04-05 (×4): qty 10

## 2020-04-05 MED ORDER — HYDROMORPHONE HCL 1 MG/ML IJ SOLN
1.0000 mg | Freq: Once | INTRAMUSCULAR | Status: AC
  Administered 2020-04-05: 1 mg via INTRAVENOUS
  Filled 2020-04-05: qty 1

## 2020-04-05 MED ORDER — TORSEMIDE 20 MG PO TABS
100.0000 mg | ORAL_TABLET | Freq: Every morning | ORAL | Status: DC
  Administered 2020-04-05 – 2020-04-07 (×3): 100 mg via ORAL
  Filled 2020-04-05 (×3): qty 5

## 2020-04-05 MED ORDER — IPRATROPIUM-ALBUTEROL 0.5-2.5 (3) MG/3ML IN SOLN
3.0000 mL | RESPIRATORY_TRACT | Status: DC | PRN
  Administered 2020-04-05 – 2020-04-11 (×8): 3 mL via RESPIRATORY_TRACT
  Filled 2020-04-05 (×9): qty 3

## 2020-04-05 NOTE — TOC Initial Note (Signed)
Transition of Care [facility]) - Initial/Assessment Note   Patient Details  Name: Taylor Willis MRN: [id number] Date of Birth: April 19, 1940  Transition of Care [facility]) CM/SW Contact:    Sherie Don, LCSW Phone Number: 04/05/2020, 10:44 AM   Clinical Narrative: Patient is a 80 year old male who was admitted for acute respiratory failure with hypoxia. Per documentation, patient appears to have been set up with [facility] for dialysis but has not yet attended. TOC to confirm patient has a dialysis chair prior to discharge. CSW spoke with [facility] and confirmed he is set up for dialysis TThSat with a chair time of 11:45am. If patient will not discharge well before his chair time tomorrow, patient will need dialysis before discharge. Hospitalist updated. TOC to follow.  Expected Discharge Plan: Home/Self Care Barriers to Discharge: Continued Medical Work up  Patient Goals and CMS Choice CMS Medicare.gov Compare Post Acute Care list provided to:: Patient Choice offered to / list presented to : Patient  Expected Discharge Plan and Services Expected Discharge Plan: Home/Self Care In-house Referral: Clinical Social Work Post Acute Care Choice: Dialysis Living arrangements for the past 2 months: Single Family Home  Prior Living Arrangements/Services Living arrangements for the past 2 months: Single Family Home Lives with:: Spouse Patient language and need for interpreter reviewed:: Yes Do you feel safe going back to the place where you live?: Yes      Need for Family Participation in Patient Care: No (Comment) Care giver support system in place?: Yes (comment) Criminal Activity/Legal Involvement Pertinent to Current Situation/Hospitalization: No - Comment as needed  Activities of Daily Living Home Assistive Devices/Equipment: None ADL Screening (condition at time of admission) Patient's cognitive ability adequate to safely complete daily activities?: Yes Is the patient deaf or have difficulty  hearing?: No Does the patient have difficulty seeing, even when wearing glasses/contacts?: No Does the patient have difficulty concentrating, remembering, or making decisions?: No Patient able to express need for assistance with ADLs?: Yes Does the patient have difficulty dressing or bathing?: No Independently performs ADLs?: Yes (appropriate for developmental age) Does the patient have difficulty walking or climbing stairs?: No Weakness of Legs: None Weakness of Arms/Hands: None  Emotional Assessment Orientation: : Oriented to Self,Oriented to Place,Oriented to  Time,Oriented to Situation Alcohol / Substance Use: Not Applicable Psych Involvement: No (comment)  Admission diagnosis:  Dialysis patient ([place]) [Z99.2] Pulmonary edema [J81.1] Acute respiratory failure with hypoxia ([place]) [J96.01] Patient Active Problem List   Diagnosis Date Noted  . Acute respiratory failure with hypoxia ([place]) 04/04/2020  . Pulmonary edema 04/04/2020  . GERD (gastroesophageal reflux disease) 04/04/2020  . Prostate cancer ([place]) 04/04/2020  . History of asthma 04/04/2020  . CKD (chronic kidney disease) stage 5, GFR less than 15 ml/min (HCC) 10/12/2019  . Ureteral calculus 05/23/2012   PCP:  Deloria Lair., MD Pharmacy:   [facility] [address] [place] [address] Phone: [phone] Fax: [phone]  Readmission Risk Interventions No flowsheet data found.

## 2020-04-05 NOTE — Progress Notes (Signed)
PROGRESS NOTE    Taylor Willis  NKN:[id number] DOB: Jun 12, 1940 DOA: 04/04/2020 PCP: Deloria Lair., MD   Chief complaint: Acute respiratory failure with hypoxia in the setting of pulmonary edema and vascular congestion.  Brief admission Narrative:  Taylor Willis is a 80 y.o. male with medical history significant of prostate cancer, history of asthma clinically disease stage V (close to require hemodialysis as deformity reports), hyperlipidemia, hypertension and gout; who presented to the hospital secondary to shortness of breath, hypoxia, orthopnea and decreasing urine output.  Patient actively Followed by Dr. August Albino as an outpatient for his renal failure and looking to start HD on 04/04/20.  Unfortunately since Sunday he has experienced ongoing progressive shortness of breath, dyspnea on exertion and orthopnea.  He presented to [facility] where he was found to have pulmonary edema on chest x-ray, hypoxic in the mid 80s on room air with requirement for BiPAP initiation.  Patient denies fever, chills, hemoptysis, dysuria, hematuria, chest pain, nausea, vomiting, sick contacts or any other complaints.  Covid screening test done at [facility] [place] negative.  His blood work demonstrated metabolic acidosis, BNP more than 35,000 and worsening creatinine/BUN levels.  His hemoglobin decreased to 7.7 from 9's range on previous blood work. No overt bleeding reported.  ED Course: Patient was started on BiPAP and transfer to antipain the hospital stepdown bed for further evaluation, management and dialysis initiation.  Assessment & Plan: 1-acute respiratory failure with hypoxia -In the setting of pulmonary edema and vascular congestion -Continue diuretics -Patient off BiPAP, using 5 L nasal cannula supplementation currently. -Planning for second dialysis treatment today, continue to follow nephrology recommendations. -Continue to wean off oxygen supplementation as tolerated.  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 -Continue to follow nephrology service recommendation -Continue plan for maximum ultrafiltration -Renal diet and fluid restriction orders in place. -Follow daily weights.  3-gastroesophageal flux disease -Continue PPI  4-essential hypertension -Continue current antihypertensive agents -Blood pressure stable  5-secondary hyperparathyroidism -Continue PhosLo  6-mild hypokalemia -Anticipated repletion and stability after dialysis -Will follow electrolytes and trying to get  7-anemia of chronic kidney disease -No signs of overt bleeding -IV iron and Epogen therapy as per nephrology discretion.  8-hx of asthma -mild wheezing appreciated on exam today -will continue PRN nebulizer bronchodilators.  DVT prophylaxis: Heparin Code Status: Full code Family Communication: Wife at bedside. Disposition:   Status is: Inpatient  Dispo: The patient is from: home              Anticipated d/c is to: home              Patient currently not medically stable for discharge. Continue HD therapy and volume stabilization. Requiring 5L HFNC.   Difficult to place patient noe       Consultants:   Nephrology service.   Procedures:  HD See below for x-ray reports.    Antimicrobials:  None    Subjective: Still feeling short of breath and expressing orthopnea.  No chest pain, but expressing tightness sensation. Patient denies nausea and vomiting.  Objective: Vitals:   04/05/20 0400 04/05/20 0438 04/05/20 0500 04/05/20 0600  BP: 118/72  132/73 125/69  Pulse: 78 83 79 74  Resp: 12 (!) 25 18 19   Temp: 97.8 F (36.6 C)     TempSrc: Oral     SpO2: 99% 98% 100% 100%  Weight:      Height:        Intake/Output Summary (  Last 24 hours) at 04/05/2020 0918 Last data filed at 04/05/2020 0400 Gross per 24 hour  Intake 200 ml  Output 1800 ml  Net -1600 ml   Filed Weights   04/04/20 0930 04/04/20 1850   Weight: 92.2 kg 92.2 kg    Examination:  General exam: Mild distress secondary to chest tightness sensation, positive orthopnea, short winded sensation with activity.  Using 5 L nasal cannula supplementation. Respiratory system: Bibasilar crackles appreciated on exam; mild expiratory wheezing.  Not using accessory muscle. Cardiovascular system: S1 & S2 heard, RRR. No JVD, murmurs, rubs, gallops or clicks. No pedal edema. Gastrointestinal system: Abdomen is nondistended, soft and nontender. No organomegaly or masses felt. Normal bowel sounds heard. Central nervous system: Alert and oriented. No focal neurological deficits. Extremities: Trace edema bilaterally appreciated.  Left upper extremity AV fistula in place.  Positive bruit appreciated.  Right femoral hemodialysis catheter in place. Skin: No petechiae. Psychiatry: Judgement and insight appear normal. Mood & affect appropriate.     Data Reviewed: I have personally reviewed following labs and imaging studies  CBC: Recent Labs  Lab 04/04/20 1121 04/05/20 0304 04/05/20 0818  WBC 7.8 8.8  --   HGB 7.3* 7.4* 7.9*  HCT 22.2* 22.3* 23.7*  MCV 96.1 95.7  --   PLT 184 204  --     Basic Metabolic Panel: Recent Labs  Lab 04/04/20 1121 04/05/20 0304  NA 128* 129*  K 4.1 4.0  CL 90* 92*  CO2 21* 22  GLUCOSE 170* 149*  BUN 103* 87*  CREATININE 8.69* 7.49*  CALCIUM 8.4* 8.7*  MG 2.4  --   PHOS 7.4* 7.1*    GFR: Estimated Creatinine Clearance: 9.3 mL/min (A) (by C-G formula based on SCr of 7.49 mg/dL (H)).  Liver Function Tests: Recent Labs  Lab 04/04/20 1121 04/05/20 0304  AST 28  --   ALT 36  --   ALKPHOS 51  --   BILITOT 0.9  --   PROT 7.0  --   ALBUMIN 3.0* 3.3*    CBG: No results for input(s): GLUCAP in the last 168 hours.   Recent Results (from the past 240 hour(s))  MRSA PCR Screening     Status: None   Collection Time: 04/04/20 11:52 AM   Specimen: Nasal Mucosa; Nasopharyngeal  Result Value Ref  Range Status   MRSA by PCR NEGATIVE NEGATIVE Final    Comment:        The GeneXpert MRSA Assay (FDA approved for NASAL specimens only), is one component of a comprehensive MRSA colonization surveillance program. It is not intended to diagnose MRSA infection nor to guide or monitor treatment for MRSA infections. Performed at [facility], [address], [place], [place] [address]      Radiology Studies: No results found.   Scheduled Meds: . calcium acetate  667 mg Oral BID WC  . carvedilol  3.125 mg Oral BID WC  . Chlorhexidine Gluconate Cloth  6 each Topical Q0600  . darbepoetin (ARANESP) injection - DIALYSIS  60 mcg Intravenous Q Fri-HD  . heparin  5,000 Units Subcutaneous Q8H  .  HYDROmorphone (DILAUDID) injection  1 mg Intravenous Once  . sodium chloride flush  3 mL Intravenous Q12H  . torsemide  100 mg Oral q morning  . vitamin B-12  1,000 mcg Oral Daily   Continuous Infusions: . sodium chloride    . sodium chloride    . sodium chloride    . ferric gluconate (FERRLECIT/NULECIT) IV  LOS: 1 day    Time spent: 35 minutes   Barton Dubois, MD [facility]   To contact the attending provider between 7A-7P or the covering provider during after hours 7P-7A, please log into the web site [email or web address] and access using universal [place] password for that web site. If you do not have the password, please call the hospital operator.  04/05/2020, 9:18 AM

## 2020-04-05 NOTE — Procedures (Signed)
° °  NEPHROLOGY NURSING NOTE:  AVF was re-assessed for cannulation viability and discussed with Ronny Bacon RN - HD program manager and Jake Bathe, CCHT expert cannulator at [facility].  Due to tortuosity of vessel and limited sites for needle rotation, the recommendation is to proceed with [facility] placement.

## 2020-04-05 NOTE — Progress Notes (Signed)
Bipap order is as needed.  Not needed at this time.

## 2020-04-05 NOTE — Progress Notes (Addendum)
Admit: 04/04/2020 LOS: 1  16M new ESRD was starting HD [facility] but developed acute hypoxic RF prior to initiation  Subjective:  . HD#1 yesterday, unable to cannulate AVF, rec R femoral TEMP HD cath . Had 1.5L UF . This AM still some SOB / chest tightness, on Rew  . Hb 7.4, ESA start today, Fe levels below   03/03 0701 - 03/04 0700 In: 200 [P.O.:200] Out: 1800 [Urine:300]  Filed Weights   04/04/20 0930 04/04/20 1850  Weight: 92.2 kg 92.2 kg    Scheduled Meds: . calcium acetate  667 mg Oral BID WC  . carvedilol  3.125 mg Oral BID WC  . Chlorhexidine Gluconate Cloth  6 each Topical Q0600  . darbepoetin (ARANESP) injection - DIALYSIS  60 mcg Intravenous Q Fri-HD  . furosemide  60 mg Intravenous Q12H  . heparin  5,000 Units Subcutaneous Q8H  . sodium bicarbonate  650 mg Oral TID  . sodium chloride flush  3 mL Intravenous Q12H  . vitamin B-12  1,000 mcg Oral Daily   Continuous Infusions: . sodium chloride    . sodium chloride    . sodium chloride     PRN Meds:.sodium chloride, sodium chloride, sodium chloride, acetaminophen, albuterol, alteplase, heparin, ondansetron **OR** ondansetron (ZOFRAN) IV, sodium chloride flush  Current Labs: reviewed  Results for DICK, HARK (MRN [id number]) as of 04/05/2020 08:25  Ref. Range 04/05/2020 03:04  Saturation Ratios Latest Ref Range: 17.9 - 39.5 % 17 (L)  Ferritin Latest Ref Range: 24 - 336 ng/mL 271    Physical Exam:  Blood pressure 125/69, pulse 74, temperature 97.8 F (36.6 C), temperature source Oral, resp. rate 19, height 5\' 11"  (1.803 m), weight 92.2 kg, SpO2 100 %. Mild resp distress, eldery male RRR  Diminished in bases, mild inc WOB S/ntn AVF with weak B/T, bandaged L Fem Temp HD cath No LEE S/nt NCAT EOMI  A 1. ESRD, new; CLIP is to [employer] [facility] Shift;  1. developed #2 prior to first outpt HD,  2. started HD here 3/3 3. Failed cannulation of AVF 3/3, req R fem TEMP HD cath with Dr. Constance Haw 4. HD#2  today, more UF,  5. RN to look at AVF again, consider reattempt cannulation, if not possible, will need [facility] and will d/w Dr. Constance Haw 2. Acute hypoxic RF now on [place] but uses no O2 at home, per [facility]; Cont diuretics through the weekend to augment UOP 3. Anemia of CKD, Hb 7s.  ESA today, Start IV Fe.  No hx/o CAD so prob ok not to tranfuse unless < 7 4. CKD-BMD 1. Trend P with HD start; PhosLo 2. PTH pending 5. HTN/Vol: as above, needs more UF 6. Hyponatremia, mild, will correc with HD  P . As above, HD today, Goal UF 2L, HD cath or AVF.  No heparin, 3K . Likely will require dialysis again tomorrow, ordered. . Will follow chart and labs over the weekend, might not see patient.  Please call with any questions or concerns. . Medication Issues; o Preferred narcotic agents for pain control are hydromorphone, fentanyl, and methadone. Morphine should not be used.  o Baclofen should be avoided o Avoid oral sodium phosphate and magnesium citrate based laxatives / bowel preps    Pearson Grippe MD 04/05/2020, 8:24 AM  Recent Labs  Lab 04/04/20 1121 04/05/20 0304  NA 128* 129*  K 4.1 4.0  CL 90* 92*  CO2 21* 22  GLUCOSE 170* 149*  BUN 103*  87*  CREATININE 8.69* 7.49*  CALCIUM 8.4* 8.7*  PHOS 7.4* 7.1*   Recent Labs  Lab 04/04/20 1121 04/05/20 0304  WBC 7.8 8.8  HGB 7.3* 7.4*  HCT 22.2* 22.3*  MCV 96.1 95.7  PLT 184 204

## 2020-04-05 NOTE — Procedures (Signed)
   HEMODIALYSIS TREATMENT NOTE (HD#2):  2.5 hour heparin-free HD completed via right femoral temporary catheter.  Arterial port would not aspirate therefore flow was reversed.  Goal met: 2 liters removed without interruption in UF.  All blood was returned.  Rockwell Alexandria, RN

## 2020-04-06 DIAGNOSIS — Z8709 Personal history of other diseases of the respiratory system: Secondary | ICD-10-CM | POA: Diagnosis not present

## 2020-04-06 DIAGNOSIS — N185 Chronic kidney disease, stage 5: Secondary | ICD-10-CM | POA: Diagnosis not present

## 2020-04-06 DIAGNOSIS — J9601 Acute respiratory failure with hypoxia: Secondary | ICD-10-CM | POA: Diagnosis not present

## 2020-04-06 DIAGNOSIS — K219 Gastro-esophageal reflux disease without esophagitis: Secondary | ICD-10-CM | POA: Diagnosis not present

## 2020-04-06 LAB — CBC
HCT: 24 % — ABNORMAL LOW (ref 39.0–52.0)
Hemoglobin: 7.6 g/dL — ABNORMAL LOW (ref 13.0–17.0)
MCH: 31.3 pg (ref 26.0–34.0)
MCHC: 31.7 g/dL (ref 30.0–36.0)
MCV: 98.8 fL (ref 80.0–100.0)
Platelets: 188 10*3/uL (ref 150–400)
RBC: 2.43 MIL/uL — ABNORMAL LOW (ref 4.22–5.81)
RDW: 13.7 % (ref 11.5–15.5)
WBC: 15.1 10*3/uL — ABNORMAL HIGH (ref 4.0–10.5)
nRBC: 0.3 % — ABNORMAL HIGH (ref 0.0–0.2)

## 2020-04-06 MED ORDER — ALBUMIN HUMAN 25 % IV SOLN
25.0000 g | Freq: Once | INTRAVENOUS | Status: AC
  Administered 2020-04-06: 25 g via INTRAVENOUS

## 2020-04-06 NOTE — Progress Notes (Signed)
PROGRESS NOTE    Taylor Willis  YTK:[id number] DOB: [birth year] DOA: 04/04/2020 PCP: Deloria Lair., MD   Chief complaint: Acute respiratory failure with hypoxia in the setting of pulmonary edema and vascular congestion.  Brief admission Narrative:  Taylor Willis is a 80 y.o. male with medical history significant of prostate cancer, history of asthma clinically disease stage V (close to require hemodialysis as deformity reports), hyperlipidemia, hypertension and gout; who presented to the hospital secondary to shortness of breath, hypoxia, orthopnea and decreasing urine output.  Patient actively Followed by Dr. August Albino as an outpatient for his renal failure and looking to start HD on 04/04/20.  Unfortunately since Sunday he has experienced ongoing progressive shortness of breath, dyspnea on exertion and orthopnea.  He presented to [facility] where he was found to have pulmonary edema on chest x-ray, hypoxic in the mid 80s on room air with requirement for BiPAP initiation.  Patient denies fever, chills, hemoptysis, dysuria, hematuria, chest pain, nausea, vomiting, sick contacts or any other complaints.  Covid screening test done at [facility] negative.  His blood work demonstrated metabolic acidosis, BNP more than 35,000 and worsening creatinine/BUN levels.  His hemoglobin decreased to 7.7 from 9's range on previous blood work. No overt bleeding reported.  ED Course: Patient was started on BiPAP and transfer to antipain the hospital stepdown bed for further evaluation, management and dialysis initiation.  Assessment & Plan: 1-acute respiratory failure with hypoxia -In the setting of pulmonary edema and vascular congestion -Continue diuretics -Patient off BiPAP, using 3 L nasal cannula supplementation currently. -Planning for third HD treatment today, continue to follow nephrology recommendations. -Continue to wean off oxygen supplementation as tolerated. -after HD today femoral line  will be removed and will plan for tunneled HD catheter placement on 04/08/20.  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 -Continue to follow nephrology service recommendation -Continue plan for maximum ultrafiltration -Renal diet and fluid restriction orders in place. -Follow daily weights.  3-gastroesophageal flux disease -Continue PPI  4-essential hypertension -Continue current antihypertensive agents -Blood pressure stable  5-secondary hyperparathyroidism -Continue PhosLo  6-mild hypokalemia -Anticipated repletion and stability after dialysis -Will follow electrolytes and trying to get  7-anemia of chronic kidney disease -No signs of overt bleeding -IV iron and Epogen therapy as per nephrology discretion.  8-hx of asthma -mild wheezing appreciated on exam today -will continue PRN nebulizer bronchodilators.  DVT prophylaxis: Heparin Code Status: Full code Family Communication: Wife at bedside. Disposition:   Status is: Inpatient  Dispo: The patient is from: home              Anticipated d/c is to: home              Patient currently not medically stable for discharge. Continue HD therapy and volume stabilization. Requiring 3L HFNC.   Difficult to place patient noe       Consultants:   Nephrology service.   Procedures:  HD See below for x-ray reports.    Antimicrobials:  None    Subjective: No complaints of chest pain, no nausea, no vomiting.  Reporting significant improvement in his breathing.  Patient is a consultation.  Having dialysis currently.  Objective: Vitals:   04/06/20 1400 04/06/20 1500 04/06/20 1600 04/06/20 1650  BP: 109/62 114/61 128/64 128/64  Pulse: 75 75 73 75  Resp: 16 18 20    Temp:      TempSrc:      SpO2: 96% 95% 95%  Weight:      Height:        Intake/Output Summary (Last 24 hours) at 04/06/2020 1836 Last data filed at 04/06/2020 1500 Gross per 24 hour  Intake 220  ml  Output 1034 ml  Net -814 ml   Filed Weights   04/04/20 0930 04/04/20 1850 04/05/20 1000  Weight: 92.2 kg 92.2 kg 89.7 kg    Examination: General exam: Alert, awake, oriented x 3; no chest pain, no nausea, no vomiting, reports breathing significant improvement.  Using 3 L nasal cannula supplementation with good saturation. Respiratory system: Clear to auscultation. Respiratory effort normal.  Decreased breath sounds at the bases; no wheezing, no frank crackles. Cardiovascular system:RRR. No murmurs, rubs, gallops.  No JVD. Gastrointestinal system: Abdomen is nondistended, soft and nontender. No organomegaly or masses felt. Normal bowel sounds heard. Central nervous system: Alert and oriented. No focal neurological deficits. Extremities: No cyanosis, clubbing or edema appreciated on exam.  Left upper extremity with positive AVF Skin: No rashes, no petechiae. Psychiatry: Judgement and insight appear normal. Mood & affect appropriate.    Data Reviewed: I have personally reviewed following labs and imaging studies  CBC: Recent Labs  Lab 04/04/20 1121 04/05/20 0304 04/05/20 0818 04/06/20 1030  WBC 7.8 8.8  --  15.1*  HGB 7.3* 7.4* 7.9* 7.6*  HCT 22.2* 22.3* 23.7* 24.0*  MCV 96.1 95.7  --  98.8  PLT 184 204  --  161    Basic Metabolic Panel: Recent Labs  Lab 04/04/20 1121 04/04/20 1528 04/05/20 0304  NA 128*  --  129*  K 4.1  --  4.0  CL 90*  --  92*  CO2 21*  --  22  GLUCOSE 170*  --  149*  BUN 103*  --  87*  CREATININE 8.69*  --  7.49*  CALCIUM 8.4* 8.9 8.7*  MG 2.4  --   --   PHOS 7.4*  --  7.1*    GFR: Estimated Creatinine Clearance: 8.5 mL/min (A) (by C-G formula based on SCr of 7.49 mg/dL (H)).  Liver Function Tests: Recent Labs  Lab 04/04/20 1121 04/05/20 0304  AST 28  --   ALT 36  --   ALKPHOS 51  --   BILITOT 0.9  --   PROT 7.0  --   ALBUMIN 3.0* 3.3*    CBG: No results for input(s): GLUCAP in the last 168 hours.   Recent Results (from  the past 240 hour(s))  MRSA PCR Screening     Status: None   Collection Time: 04/04/20 11:52 AM   Specimen: Nasal Mucosa; Nasopharyngeal  Result Value Ref Range Status   MRSA by PCR NEGATIVE NEGATIVE Final    Comment:        The GeneXpert MRSA Assay (FDA approved for NASAL specimens only), is one component of a comprehensive MRSA colonization surveillance program. It is not intended to diagnose MRSA infection nor to guide or monitor treatment for MRSA infections. Performed at [facility], [address] [place], [place] [address]      Radiology Studies: No results found.   Scheduled Meds: . calcium acetate  667 mg Oral BID WC  . carvedilol  3.125 mg Oral BID WC  . Chlorhexidine Gluconate Cloth  6 each Topical Q0600  . darbepoetin (ARANESP) injection - DIALYSIS  60 mcg Intravenous Q Fri-HD  . heparin  5,000 Units Subcutaneous Q8H  . sodium chloride flush  3 mL Intravenous Q12H  . torsemide  100 mg Oral  q morning  . vitamin B-12  1,000 mcg Oral Daily   Continuous Infusions: . sodium chloride    . sodium chloride    . sodium chloride    . ferric gluconate (FERRLECIT/NULECIT) IV 125 mg (04/06/20 1126)     LOS: 2 days    Time spent: 30 minutes   Barton Dubois, MD [facility]   To contact the attending provider between 7A-7P or the covering provider during after hours 7P-7A, please log into the web site [email or web address] and access using universal [place] password for that web site. If you do not have the password, please call the hospital operator.  04/06/2020, 6:36 PM

## 2020-04-06 NOTE — Progress Notes (Signed)
Temporary dialysis line removed by Dr. Constance Haw at 1230. I stayed with patient for 30 minutes and applied continuous pressure. No obvious signs of bleeding at this time. Will continue to monitor dressing.

## 2020-04-06 NOTE — Consult Note (Signed)
[facility] Consult  Reason for Consult: ESRD, tunneled catheter  Referring Physician:  Dr. Dyann Kief     HPI: Taylor Willis is a 80 y.o. 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 He has tolerated dialysis and had fluid removed. He is feeling better. Given the need for continued dialysis at home and the tortorusity of the fistula, we will proceed with tunneled dialysis catheter placement.  The patient says he is breathing better and overall not requiring the Bipap.   Past Medical History:  Diagnosis Date  . Arthritis    gout  . Asthma    has rescue inhaler  . Cancer [facility])    prostate  . Chronic kidney disease   . GERD (gastroesophageal reflux disease)   . H/O peptic ulcer 1970's  . Hyperlipidemia   . Hypertension   . Ureteral stone   . Wears dentures   . Wears glasses     Past Surgical History:  Procedure Laterality Date  . AV FISTULA PLACEMENT Left 02/15/2019   Procedure: ARTERIOVENOUS (AV) FISTULA CREATION LEFT ARM;  Surgeon: Rosetta Posner, MD;  Location: [place];  Service: Vascular;  Laterality: Left;  . CATARACT EXTRACTION W/ INTRAOCULAR LENS  IMPLANT, BILATERAL    . CYSTOSCOPY/RETROGRADE/URETEROSCOPY/STONE EXTRACTION WITH BASKET Right 05/23/2012   Procedure: CYSTOSCOPY/RETROGRADE/URETEROSCOPY/STONE EXTRACTION WITH BASKET;  Surgeon: Bernestine Amass, MD;  Location: [facility];  Service: Urology;  Laterality: Right;  . FRACTURE SURGERY Right    as a child  . HERNIA REPAIR    . HOLMIUM LASER APPLICATION Right 0/93/8182   Procedure: HOLMIUM LASER APPLICATION;  Surgeon: Bernestine Amass, MD;  Location: [facility];  Service: Urology;  Laterality: Right;  . LIGATION OF ARTERIOVENOUS  FISTULA Left 09/12/2019   Procedure: SIDE BRANCH LIGATION  OF LEFT ARM ARTERIOVENOUS FISTULA;  Surgeon: Elam Dutch, MD;  Location: [place];  Service: Vascular;  Laterality: Left;  Marland Kitchen MULTIPLE TOOTH EXTRACTIONS    .  REVISON OF ARTERIOVENOUS FISTULA Left 09/12/2019   Procedure: REVISON OF LEFT ARM ARTERIOVENOUS FISTULA;  Surgeon: Elam Dutch, MD;  Location: [place];  Service: Vascular;  Laterality: Left;  . ROBOT ASSISTED LAPAROSCOPIC RADICAL PROSTATECTOMY  2011  . TONSILLECTOMY      History reviewed. No pertinent family history.  Social History   Tobacco Use  . Smoking status: Former Smoker    Types: Cigarettes    Quit date: 05/18/1982    Years since quitting: 37.9  . Smokeless tobacco: Never Used  Vaping Use  . Vaping Use: Never used  Substance Use Topics  . Alcohol use: Not Currently    Alcohol/week: 1.0 standard drink    Types: 1 Standard drinks or equivalent per week  . Drug use: Never    Medications:  I have reviewed the patient's current medications. Prior to Admission:  Medications Prior to Admission  Medication Sig Dispense Refill Last Dose  . acetaminophen (TYLENOL) 500 MG tablet Take 1,000 mg by mouth every 6 (six) hours as needed for mild pain or headache.   04/04/2020 at Unknown time  . albuterol (PROVENTIL HFA;VENTOLIN HFA) 108 (90 BASE) MCG/ACT inhaler Inhale 2 puffs into the lungs every 6 (six) hours as needed for wheezing.   Past Week at Unknown time  . allopurinol (ZYLOPRIM) 100 MG tablet Take 100 mg by mouth daily.   04/03/2020 at Unknown time  . amLODipine (NORVASC) 10 MG tablet Take 10 mg by mouth  daily.    04/03/2020  . amoxicillin-clavulanate (AUGMENTIN) 500-125 MG tablet Take 1 tablet by mouth in the morning and at bedtime.   04/03/2020  . azithromycin (ZITHROMAX) 250 MG tablet Take 1 tablet by mouth as directed.   04/03/2020  . calcitRIOL (ROCALTROL) 0.25 MCG capsule Take 0.25 mcg by mouth 3 (three) times a week.   Past Week at Unknown time  . calcium acetate (PHOSLO) 667 MG capsule Take 667 mg by mouth 2 (two) times daily with a meal.   04/03/2020  . carvedilol (COREG) 3.125 MG tablet Take 3.125 mg by mouth 2 (two) times daily with a meal.   04/03/2020  . Cholecalciferol 25  MCG (1000 UT) tablet Take 1,000 Units by mouth daily.    04/03/2020  . Cinnamon 500 MG capsule Take 500 mg by mouth daily.   04/03/2020  . Cranberry 250 MG CAPS Take 500 mg by mouth daily.    04/03/2020  . FEROSUL 325 (65 Fe) MG tablet Take 325 mg by mouth in the morning, at noon, and at bedtime.   04/03/2020  . furosemide (LASIX) 40 MG tablet Take 40 mg by mouth.   04/03/2020  . sodium bicarbonate 650 MG tablet Take 650 mg by mouth 3 (three) times daily.   04/03/2020  . vitamin B-12 (CYANOCOBALAMIN) 1000 MCG tablet Take 1,000 mcg by mouth daily.   04/03/2020  . oxyCODONE-acetaminophen (PERCOCET) 5-325 MG tablet Take 1 tablet by mouth every 4 (four) hours as needed for severe pain. (Patient not taking: No sig reported) 6 tablet 0 Not Taking at Unknown time   Scheduled: . calcium acetate  667 mg Oral BID WC  . carvedilol  3.125 mg Oral BID WC  . Chlorhexidine Gluconate Cloth  6 each Topical Q0600  . darbepoetin (ARANESP) injection - DIALYSIS  60 mcg Intravenous Q Fri-HD  . heparin  5,000 Units Subcutaneous Q8H  . sodium chloride flush  3 mL Intravenous Q12H  . torsemide  100 mg Oral q morning  . vitamin B-12  1,000 mcg Oral Daily   Continuous: . sodium chloride    . sodium chloride    . sodium chloride    . ferric gluconate (FERRLECIT/NULECIT) IV 125 mg (04/06/20 1126)   JAS:NKNLZJ chloride, sodium chloride, sodium chloride, acetaminophen, alteplase, heparin, ipratropium-albuterol, ondansetron **OR** ondansetron (ZOFRAN) IV, sodium chloride flush  Allergies  Allergen Reactions  . Aspirin Other (See Comments)    GI irritation   . Hydrochlorothiazide     Dizziness and unable to walk  . Ibuprofen Other (See Comments)    GI irritation      ROS:  A comprehensive review of systems was negative except for: Respiratory: positive for SOB Genitourinary: positive for ESRD on dialysis  Blood pressure 116/66, pulse 70, temperature 97.6 F (36.4 C), temperature source Oral, resp. rate 18, height 5'  11" (1.803 m), weight 89.7 kg, SpO2 94 %. Physical Exam Vitals reviewed.  Constitutional:      Appearance: He is normal weight.  HENT:     Head: Normocephalic.     Nose: Nose normal.  Eyes:     Extraocular Movements: Extraocular movements intact.  Cardiovascular:     Rate and Rhythm: Normal rate and regular rhythm.     Comments: Right femoral dialysis catheter removed, dressing placed, pressure held by RN for 30 minutes post removal Pulmonary:     Effort: Pulmonary effort is normal.     Breath sounds: Normal breath sounds.  Abdominal:     General:  There is no distension.     Palpations: Abdomen is soft.     Tenderness: There is no abdominal tenderness.  Musculoskeletal:        General: Normal range of motion.     Cervical back: Normal range of motion.  Skin:    General: Skin is warm.  Neurological:     General: No focal deficit present.     Mental Status: He is alert and oriented to person, place, and time.  Psychiatric:        Mood and Affect: Mood normal.        Behavior: Behavior normal.        Thought Content: Thought content normal.        Judgment: Judgment normal.     Results: Results for orders placed or performed during the hospital encounter of 04/04/20 (from the past 48 hour(s))  PTH, intact and calcium     Status: Abnormal   Collection Time: 04/04/20  3:28 PM  Result Value Ref Range   PTH 133 (H) 15 - 65 pg/mL   Calcium, Total (PTH) 8.9 8.6 - 10.2 mg/dL   PTH Interp Comment     Comment: (NOTE) Interpretation                 Intact PTH    Calcium                                (pg/mL)      (mg/dL) Normal                          15 - 65     8.6 - 10.2 Primary Hyperparathyroidism         >65          >10.2 Secondary Hyperparathyroidism       >65          <10.2 Non-Parathyroid Hypercalcemia       <65          >10.2 Hypoparathyroidism                  <15          < 8.6 Non-Parathyroid Hypocalcemia    15 - 65          < 8.6 Performed At: [place], [place] [id number] Rush Farmer MD CB:[id number]   CBC     Status: Abnormal   Collection Time: 04/05/20  3:04 AM  Result Value Ref Range   WBC 8.8 4.0 - 10.5 K/uL   RBC 2.33 (L) 4.22 - 5.81 MIL/uL   Hemoglobin 7.4 (L) 13.0 - 17.0 g/dL   HCT 22.3 (L) 39.0 - 52.0 %   MCV 95.7 80.0 - 100.0 fL   MCH 31.8 26.0 - 34.0 pg   MCHC 33.2 30.0 - 36.0 g/dL   RDW 13.6 11.5 - 15.5 %   Platelets 204 150 - 400 K/uL   nRBC 0.0 0.0 - 0.2 %    Comment: Performed at [facility], [address] [place], [place] [address]  Renal function panel     Status: Abnormal   Collection Time: 04/05/20  3:04 AM  Result Value Ref Range   Sodium 129 (L) 135 - 145 mmol/L   Potassium 4.0 3.5 - 5.1 mmol/L   Chloride 92 (L) 98 - 111 mmol/L   CO2 22 22 -  32 mmol/L   Glucose, Bld 149 (H) 70 - 99 mg/dL    Comment: Glucose reference range applies only to samples taken after fasting for at least 8 hours.   BUN 87 (H) 8 - 23 mg/dL   Creatinine, Ser 7.49 (H) 0.61 - 1.24 mg/dL   Calcium 8.7 (L) 8.9 - 10.3 mg/dL   Phosphorus 7.1 (H) 2.5 - 4.6 mg/dL   Albumin 3.3 (L) 3.5 - 5.0 g/dL   GFR, Estimated 7 (L) >60 mL/min    Comment: (NOTE) Calculated using the CKD-EPI Creatinine Equation (2021)    Anion gap 15 5 - 15    Comment: Performed at [facility], [address], [place], [place] [address]  Ferritin     Status: None   Collection Time: 04/05/20  3:04 AM  Result Value Ref Range   Ferritin 271 24 - 336 ng/mL    Comment: Performed at [facility], [address], [place], [place] [address]  Iron and TIBC     Status: Abnormal   Collection Time: 04/05/20  3:04 AM  Result Value Ref Range   Iron 48 45 - 182 ug/dL   TIBC 278 250 - 450 ug/dL   Saturation Ratios 17 (L) 17.9 - 39.5 %   UIBC 230 ug/dL    Comment: Performed at [facility], [address] [place], [place] [address]  Hepatitis B surface antigen     Status: None   Collection Time: 04/05/20  3:04 AM  Result Value Ref Range    Hepatitis B Surface Ag NON REACTIVE NON REACTIVE    Comment: Performed at [facility] [address], [place], [place] [address]  Hepatitis B surface antibody     Status: Abnormal   Collection Time: 04/05/20  3:04 AM  Result Value Ref Range   Hep B S Ab Reactive (A) NON REACTIVE    Comment: (NOTE) Consistent with immunity, greater than 9.9 mIU/mL.  Performed at [facility], [place] [address], [place], [place] [address]   Hepatitis B core antibody, total     Status: None   Collection Time: 04/05/20  3:04 AM  Result Value Ref Range   Hep B Core Total Ab NON REACTIVE NON REACTIVE    Comment: Performed at [facility] [address] [place], [place] [address]  Hemoglobin and hematocrit, blood     Status: Abnormal   Collection Time: 04/05/20  8:18 AM  Result Value Ref Range   Hemoglobin 7.9 (L) 13.0 - 17.0 g/dL   HCT 23.7 (L) 39.0 - 52.0 %    Comment: Performed at [facility], [address], [place], [place] [address]  CBC     Status: Abnormal   Collection Time: 04/06/20 10:30 AM  Result Value Ref Range   WBC 15.1 (H) 4.0 - 10.5 K/uL   RBC 2.43 (L) 4.22 - 5.81 MIL/uL   Hemoglobin 7.6 (L) 13.0 - 17.0 g/dL   HCT 24.0 (L) 39.0 - 52.0 %   MCV 98.8 80.0 - 100.0 fL   MCH 31.3 26.0 - 34.0 pg   MCHC 31.7 30.0 - 36.0 g/dL   RDW 13.7 11.5 - 15.5 %   Platelets 188 150 - 400 K/uL   nRBC 0.3 (H) 0.0 - 0.2 %    Comment: Performed at [facility], [address] [place],  [address]     Assessment & Plan:  Taylor Willis is a 80 y.o. male with ESRD who needs a tunneled catheter  placed. Femoral line is out. Discussed tunneled access and risk of bleeding, infection, pneumothorax and injury to vessels. Discussed that we will do this on Monday.    All questions were answered to the satisfaction of the patient and family.   Virl Cagey 04/06/2020, 12:38 PM

## 2020-04-06 NOTE — Procedures (Signed)
   HEMODIALYSIS TREATMENT NOTE (HD#3):  UF limited by hypotension despite Albumin and cooling of dialysate.  Net UF 1 liter.  O2 down to 3L via [place].    For [facility] placement Monday.  Right femoral vas-cath to be removed after HD today.  Rockwell Alexandria, RN

## 2020-04-07 ENCOUNTER — Inpatient Hospital Stay (HOSPITAL_COMMUNITY): Payer: Medicare Other

## 2020-04-07 DIAGNOSIS — N185 Chronic kidney disease, stage 5: Secondary | ICD-10-CM | POA: Diagnosis not present

## 2020-04-07 DIAGNOSIS — Z8709 Personal history of other diseases of the respiratory system: Secondary | ICD-10-CM | POA: Diagnosis not present

## 2020-04-07 DIAGNOSIS — J9601 Acute respiratory failure with hypoxia: Secondary | ICD-10-CM | POA: Diagnosis not present

## 2020-04-07 DIAGNOSIS — K219 Gastro-esophageal reflux disease without esophagitis: Secondary | ICD-10-CM | POA: Diagnosis not present

## 2020-04-07 LAB — SURGICAL PCR SCREEN
MRSA, PCR: NEGATIVE
Staphylococcus aureus: NEGATIVE

## 2020-04-07 MED ORDER — CEFAZOLIN SODIUM-DEXTROSE 2-4 GM/100ML-% IV SOLN
2.0000 g | INTRAVENOUS | Status: DC
  Filled 2020-04-07: qty 100

## 2020-04-07 MED ORDER — METOPROLOL TARTRATE 25 MG PO TABS: 12.5000 mg | ORAL_TABLET | Freq: Two times a day (BID) | ORAL | Status: DC

## 2020-04-07 MED ORDER — METOPROLOL TARTRATE 5 MG/5ML IV SOLN: 2.5000 mg | Freq: Once | INTRAVENOUS | Status: DC

## 2020-04-07 MED ORDER — CHLORHEXIDINE GLUCONATE CLOTH 2 % EX PADS
6.0000 | MEDICATED_PAD | Freq: Once | CUTANEOUS | Status: AC
  Administered 2020-04-08: 6 via TOPICAL

## 2020-04-07 MED ORDER — CHLORHEXIDINE GLUCONATE CLOTH 2 % EX PADS
6.0000 | MEDICATED_PAD | Freq: Once | CUTANEOUS | Status: AC
  Administered 2020-04-07: 6 via TOPICAL

## 2020-04-07 MED ORDER — CHLORHEXIDINE GLUCONATE CLOTH 2 % EX PADS
6.0000 | MEDICATED_PAD | Freq: Every day | CUTANEOUS | Status: DC
  Administered 2020-04-08: 6 via TOPICAL

## 2020-04-07 MED ORDER — CARVEDILOL 3.125 MG PO TABS
3.1250 mg | ORAL_TABLET | Freq: Two times a day (BID) | ORAL | Status: DC
  Administered 2020-04-07 – 2020-04-10 (×4): 3.125 mg via ORAL
  Filled 2020-04-07 (×5): qty 1

## 2020-04-07 NOTE — Progress Notes (Signed)
Admit: 04/04/2020 LOS: 3  77M new ESRD was starting HD [facility] but developed acute hypoxic RF prior to initiation  Subjective:  Wife says is more confused yesterday and today  . HD#3 yesterday, unable to cannulate AVF, removed fem line and plan is to place [facility] tomorrow . Had 1L UF- limited by hypotension   03/05 0701 - 03/06 0700 In: 353 [P.O.:240; I.V.:3; IV Piggyback:110] Out: 1034   Filed Weights   04/04/20 1850 04/05/20 1000 04/07/20 0400  Weight: 92.2 kg 89.7 kg 88.1 kg    Scheduled Meds: . calcium acetate  667 mg Oral BID WC  . carvedilol  3.125 mg Oral BID WC  . Chlorhexidine Gluconate Cloth  6 each Topical Q0600  . Chlorhexidine Gluconate Cloth  6 each Topical Once   And  . Chlorhexidine Gluconate Cloth  6 each Topical Once  . darbepoetin (ARANESP) injection - DIALYSIS  60 mcg Intravenous Q Fri-HD  . heparin  5,000 Units Subcutaneous Q8H  . sodium chloride flush  3 mL Intravenous Q12H  . torsemide  100 mg Oral q morning  . vitamin B-12  1,000 mcg Oral Daily   Continuous Infusions: . sodium chloride    . sodium chloride    . sodium chloride    . [START ON 04/08/2020]  ceFAZolin (ANCEF) IV    . ferric gluconate (FERRLECIT/NULECIT) IV 125 mg (04/06/20 1126)   PRN Meds:.sodium chloride, sodium chloride, sodium chloride, acetaminophen, alteplase, heparin, ipratropium-albuterol, ondansetron **OR** ondansetron (ZOFRAN) IV, sodium chloride flush  Current Labs: reviewed  Results for KASIR, HALLENBECK (MRN [id number]) as of 04/05/2020 08:25  Ref. Range 04/05/2020 03:04  Saturation Ratios Latest Ref Range: 17.9 - 39.5 % 17 (L)  Ferritin Latest Ref Range: 24 - 336 ng/mL 271    Physical Exam:  Blood pressure (!) 116/55, pulse 65, temperature (!) 97.5 F (36.4 C), temperature source Oral, resp. rate 18, height 5\' 11"  (1.803 m), weight 88.1 kg, SpO2 97 %. Mild resp distress, eldery male RRR  Diminished in bases, mild inc WOB S/ntn AVF with weak B/T, bandaged L Fem Temp HD  cath No LEE S/nt NCAT EOMI  A 1. ESRD, new; CLIP is to [employer] [facility]nd Shift; we think is already set up  1. developed #2 prior to first outpt HD,  2. started HD here 3/3 3. Failed cannulation of AVF 3/3, req R fem TEMP HD cath with Dr. Constance Haw- then done 3 days in row 3/3, 3/4 and 3/5-  Will plan for treatment #4 tomorrow after [facility] placed-     2. Acute hypoxic RF now on [place] but uses no O2 at home, per TRH;  diuretics given through the weekend to augment UOP although none really recorded so will stop diuretics-  Have turned down to 3 liters today.  Does not look like he has that much volume on 3. Anemia of CKD, Hb 7s.  ESA , Start IV Fe.  No hx/o CAD so prob ok not to tranfuse unless < 7 4. CKD-BMD 1. Trend P with HD start; PhosLo 2. PTH 133 5. HTN/Vol: as above 6. Hyponatremia, mild, will correc with HD 7. Confusion-  ICU delerium ?  Uremia ? But seems to have come on more recently         Louis Meckel  04/07/2020, 11:58 AM  Recent Labs  Lab 04/04/20 1121 04/04/20 1528 04/05/20 0304  NA 128*  --  129*  K 4.1  --  4.0  CL  90*  --  92*  CO2 21*  --  22  GLUCOSE 170*  --  149*  BUN 103*  --  87*  CREATININE 8.69*  --  7.49*  CALCIUM 8.4* 8.9 8.7*  PHOS 7.4*  --  7.1*   Recent Labs  Lab 04/04/20 1121 04/05/20 0304 04/05/20 0818 04/06/20 1030  WBC 7.8 8.8  --  15.1*  HGB 7.3* 7.4* 7.9* 7.6*  HCT 22.2* 22.3* 23.7* 24.0*  MCV 96.1 95.7  --  98.8  PLT 184 204  --  188

## 2020-04-07 NOTE — Progress Notes (Signed)
Report called to Margaretha Seeds, RN on Dept. 300. Pt to be transported via bed.

## 2020-04-07 NOTE — Progress Notes (Signed)
[facility]  Labs for AM for anesthesia. Consent obtained yesterday. Orders placed for OR tomorrow.   Curlene Labrum, MD [facility] [address] [place], [place] [address] [phone] (office)

## 2020-04-07 NOTE — Progress Notes (Signed)
PROGRESS NOTE    Taylor Willis  TSV:[id number] DOB: Jun 02, 1940 DOA: 04/04/2020 PCP: Deloria Lair., MD   Chief complaint: Acute respiratory failure with hypoxia in the setting of pulmonary edema and vascular congestion.  Brief admission Narrative:  Taylor Willis is a 80 y.o. male with medical history significant of prostate cancer, history of asthma clinically disease stage V (close to require hemodialysis as deformity reports), hyperlipidemia, hypertension and gout; who presented to the hospital secondary to shortness of breath, hypoxia, orthopnea and decreasing urine output.  Patient actively Followed by Dr. August Albino as an outpatient for his renal failure and looking to start HD on 04/04/20.  Unfortunately since Sunday he has experienced ongoing progressive shortness of breath, dyspnea on exertion and orthopnea.  He presented to [facility] where he was found to have pulmonary edema on chest x-ray, hypoxic in the mid 80s on room air with requirement for BiPAP initiation.  Patient denies fever, chills, hemoptysis, dysuria, hematuria, chest pain, nausea, vomiting, sick contacts or any other complaints.  Covid screening test done at [facility] negative.  His blood work demonstrated metabolic acidosis, BNP more than 35,000 and worsening creatinine/BUN levels.  His hemoglobin decreased to 7.7 from 9's range on previous blood work. No overt bleeding reported.  ED Course: Patient was started on BiPAP and transfer to antipain the hospital stepdown bed for further evaluation, management and dialysis initiation.  Assessment & Plan: 1-acute respiratory failure with hypoxia -In the setting of pulmonary edema and vascular congestion -Continue diuretics as dictated by nephrology  -Patient off BiPAP, using 2 L nasal cannula supplementation currently. -Planning for next HD tx on 04/09/20. -Continue to wean off oxygen supplementation as tolerated. -Hemodialysis femoral catheter removed on 04/06/20 -will  plan for tunneled HD catheter placement on 04/08/20.  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 -Continue to follow nephrology service recommendation -Continue plan for maximum ultrafiltration -Renal diet and fluid restriction orders in place. -Follow daily weights.  3-gastroesophageal flux disease -Continue PPI  4-essential hypertension -Continue current antihypertensive agents -Blood pressure stable  5-secondary hyperparathyroidism -Continue PhosLo  6-mild hypokalemia -Continue to follow electrolytes trend.   -Replete and adjust K bath with HD as needed.  7-anemia of chronic kidney disease -No signs of overt bleeding -Continue IV iron and Epogen therapy as per nephrology discretion.  8-hx of asthma -No wheezing, no use of accessory muscles, improved bilaterally. -will continue PRN nebulizer bronchodilators.  DVT prophylaxis: Heparin Code Status: Full code Family Communication: Wife at bedside. Disposition:   Status is: Inpatient  Dispo: The patient is from: home              Anticipated d/c is to: home              Patient currently not medically stable for discharge. Continue HD therapy and volume stabilization. Requiring 2L HFNC.  Tunneled hemodialysis catheter placement anticipated with 04/08/2020.  Next hemodialysis scheduled for 04/09/20.   Difficult to place patient noe       Consultants:   Nephrology service.   Procedures:  HD See below for x-ray reports.    Antimicrobials:  None    Subjective: Confused today ys reported; no chest pain, no nausea, no vomiting, sleepy.  Reporting improvement in his breathing only requiring 2 L nasal cannula supplementation.  Rest of vital signs stable.  Objective: Vitals:   04/07/20 1100 04/07/20 1200 04/07/20 1202 04/07/20 1504  BP: (!) 116/55 137/75  115/70  Pulse: 65  69  68  Resp: 18 15  19   Temp:   (!) 97.3 F (36.3 C)   TempSrc:   Oral   SpO2: 97%  95%  100%  Weight:      Height:        Intake/Output Summary (Last 24 hours) at 04/07/2020 1531 Last data filed at 04/06/2020 2111 Gross per 24 hour  Intake 243 ml  Output --  Net 243 ml   Filed Weights   04/04/20 1850 04/05/20 1000 04/07/20 0400  Weight: 92.2 kg 89.7 kg 88.1 kg    Examination: General exam: Sleepy today; wife at bedside Slightly confused from his baseline.  Chest pain, no nausea, no vomiting, [place] improvement increase breathing.  Using 2 L nasal cannula supplementation only. Respiratory system: improved air movement bilaterally.Marland Kitchen Respiratory effort normal.  No frank crackles; no wheezing currently. No using Accessory muscles. Cardiovascular system: RRR, no murmurs, no rubs, no gallops. Gastrointestinal system: Abdomen is nondistended, soft and nontender. No organomegaly or masses felt. Normal bowel sounds heard. Central nervous system: No focal neurological deficits. Extremities: No cyanosis, clubbing or edema Skin: No petechiae. Psychiatry: Mood & affect appropriate.    Data Reviewed: I have personally reviewed following labs and imaging studies  CBC: Recent Labs  Lab 04/04/20 1121 04/05/20 0304 04/05/20 0818 04/06/20 1030  WBC 7.8 8.8  --  15.1*  HGB 7.3* 7.4* 7.9* 7.6*  HCT 22.2* 22.3* 23.7* 24.0*  MCV 96.1 95.7  --  98.8  PLT 184 204  --  782    Basic Metabolic Panel: Recent Labs  Lab 04/04/20 1121 04/04/20 1528 04/05/20 0304  NA 128*  --  129*  K 4.1  --  4.0  CL 90*  --  92*  CO2 21*  --  22  GLUCOSE 170*  --  149*  BUN 103*  --  87*  CREATININE 8.69*  --  7.49*  CALCIUM 8.4* 8.9 8.7*  MG 2.4  --   --   PHOS 7.4*  --  7.1*    GFR: Estimated Creatinine Clearance: 8.5 mL/min (A) (by C-G formula based on SCr of 7.49 mg/dL (H)).  Liver Function Tests: Recent Labs  Lab 04/04/20 1121 04/05/20 0304  AST 28  --   ALT 36  --   ALKPHOS 51  --   BILITOT 0.9  --   PROT 7.0  --   ALBUMIN 3.0* 3.3*    CBG: No results for  input(s): GLUCAP in the last 168 hours.   Recent Results (from the past 240 hour(s))  MRSA PCR Screening     Status: None   Collection Time: 04/04/20 11:52 AM   Specimen: Nasal Mucosa; Nasopharyngeal  Result Value Ref Range Status   MRSA by PCR NEGATIVE NEGATIVE Final    Comment:        The GeneXpert MRSA Assay (FDA approved for NASAL specimens only), is one component of a comprehensive MRSA colonization surveillance program. It is not intended to diagnose MRSA infection nor to guide or monitor treatment for MRSA infections. Performed at [facility], [address] [place], [place] [address]      Radiology Studies: [facility] Chest [facility] 1 View  Result Date: 04/07/2020 CLINICAL DATA:  Pulmonary edema, acute respiratory failure EXAM: PORTABLE CHEST 1 VIEW COMPARISON:  04/04/2020 FINDINGS: Pulmonary insufflation is stable. Diffuse interstitial pulmonary infiltrate persists, improved since prior examination, in keeping with mild to moderate pulmonary edema. Small bilateral pleural effusions are present, left greater than right. Cardiac size  is within normal limits. IMPRESSION: Mild to moderate pulmonary edema, improved since prior examination Electronically Signed   By: Fidela Salisbury MD   On: 04/07/2020 06:30     Scheduled Meds: . calcium acetate  667 mg Oral BID WC  . carvedilol  3.125 mg Oral BID WC  . Chlorhexidine Gluconate Cloth  6 each Topical Q0600  . Chlorhexidine Gluconate Cloth  6 each Topical Once  . [START ON 04/08/2020] Chlorhexidine Gluconate Cloth  6 each Topical Q0600  . darbepoetin (ARANESP) injection - DIALYSIS  60 mcg Intravenous Q Fri-HD  . heparin  5,000 Units Subcutaneous Q8H  . sodium chloride flush  3 mL Intravenous Q12H  . vitamin B-12  1,000 mcg Oral Daily   Continuous Infusions: . sodium chloride    . sodium chloride    . sodium chloride    . [START ON 04/08/2020]  ceFAZolin (ANCEF) IV    . ferric gluconate (FERRLECIT/NULECIT) IV 125 mg (04/06/20 1126)      LOS: 3 days    Time spent: 30 minutes   Barton Dubois, MD [facility]   To contact the attending provider between 7A-7P or the covering provider during after hours 7P-7A, please log into the web site [email or web address] and access using universal [place] password for that web site. If you do not have the password, please call the hospital operator.  04/07/2020, 3:31 PM

## 2020-04-08 ENCOUNTER — Inpatient Hospital Stay (HOSPITAL_COMMUNITY): Payer: Medicare Other

## 2020-04-08 ENCOUNTER — Encounter (HOSPITAL_COMMUNITY): Payer: Self-pay | Admitting: Internal Medicine

## 2020-04-08 ENCOUNTER — Inpatient Hospital Stay (HOSPITAL_COMMUNITY): Payer: Medicare Other | Admitting: Anesthesiology

## 2020-04-08 ENCOUNTER — Encounter (HOSPITAL_COMMUNITY)
Admission: AD | Disposition: A | Discharge status: Skilled Nursing Facility | Payer: Self-pay | Source: Other Acute Inpatient Hospital | Attending: Internal Medicine

## 2020-04-08 ENCOUNTER — Other Ambulatory Visit: Payer: Self-pay

## 2020-04-08 DIAGNOSIS — N185 Chronic kidney disease, stage 5: Secondary | ICD-10-CM | POA: Diagnosis not present

## 2020-04-08 DIAGNOSIS — Z8709 Personal history of other diseases of the respiratory system: Secondary | ICD-10-CM | POA: Diagnosis not present

## 2020-04-08 DIAGNOSIS — K219 Gastro-esophageal reflux disease without esophagitis: Secondary | ICD-10-CM | POA: Diagnosis not present

## 2020-04-08 DIAGNOSIS — J9601 Acute respiratory failure with hypoxia: Secondary | ICD-10-CM | POA: Diagnosis not present

## 2020-04-08 HISTORY — PX: INSERTION OF DIALYSIS CATHETER: SHX1324

## 2020-04-08 LAB — CBC WITH DIFFERENTIAL/PLATELET
Abs Immature Granulocytes: 0.26 10*3/uL — ABNORMAL HIGH (ref 0.00–0.07)
Basophils Absolute: 0 10*3/uL (ref 0.0–0.1)
Basophils Relative: 0 %
Eosinophils Absolute: 0.1 10*3/uL (ref 0.0–0.5)
Eosinophils Relative: 0 %
HCT: 25 % — ABNORMAL LOW (ref 39.0–52.0)
Hemoglobin: 8 g/dL — ABNORMAL LOW (ref 13.0–17.0)
Immature Granulocytes: 2 %
Lymphocytes Relative: 9 %
Lymphs Abs: 1.3 10*3/uL (ref 0.7–4.0)
MCH: 31.5 pg (ref 26.0–34.0)
MCHC: 32 g/dL (ref 30.0–36.0)
MCV: 98.4 fL (ref 80.0–100.0)
Monocytes Absolute: 1 10*3/uL (ref 0.1–1.0)
Monocytes Relative: 7 %
Neutro Abs: 12.3 10*3/uL — ABNORMAL HIGH (ref 1.7–7.7)
Neutrophils Relative %: 82 %
Platelets: 208 10*3/uL (ref 150–400)
RBC: 2.54 MIL/uL — ABNORMAL LOW (ref 4.22–5.81)
RDW: 14.4 % (ref 11.5–15.5)
WBC: 14.9 10*3/uL — ABNORMAL HIGH (ref 4.0–10.5)
nRBC: 0.3 % — ABNORMAL HIGH (ref 0.0–0.2)

## 2020-04-08 LAB — RENAL FUNCTION PANEL
Albumin: 3 g/dL — ABNORMAL LOW (ref 3.5–5.0)
Anion gap: 15 (ref 5–15)
BUN: 95 mg/dL — ABNORMAL HIGH (ref 8–23)
CO2: 23 mmol/L (ref 22–32)
Calcium: 8.8 mg/dL — ABNORMAL LOW (ref 8.9–10.3)
Chloride: 95 mmol/L — ABNORMAL LOW (ref 98–111)
Creatinine, Ser: 7.7 mg/dL — ABNORMAL HIGH (ref 0.61–1.24)
GFR, Estimated: 7 mL/min — ABNORMAL LOW (ref >60)
Glucose, Bld: 112 mg/dL — ABNORMAL HIGH (ref 70–99)
Phosphorus: 6.7 mg/dL — ABNORMAL HIGH (ref 2.5–4.6)
Potassium: 4 mmol/L (ref 3.5–5.1)
Sodium: 133 mmol/L — ABNORMAL LOW (ref 135–145)

## 2020-04-08 SURGERY — INSERTION OF DIALYSIS CATHETER
Anesthesia: Monitor Anesthesia Care | Site: Chest | Laterality: Right

## 2020-04-08 MED ORDER — LACTATED RINGERS IV SOLN: INTRAVENOUS | Status: DC | PRN

## 2020-04-08 MED ORDER — HEPARIN SODIUM (PORCINE) 1000 UNIT/ML IJ SOLN
INTRAMUSCULAR | Status: AC
  Filled 2020-04-08: qty 4

## 2020-04-08 MED ORDER — LIDOCAINE HCL (PF) 1 % IJ SOLN
INTRAMUSCULAR | Status: DC | PRN
  Administered 2020-04-08: 5 mL

## 2020-04-08 MED ORDER — CHLORHEXIDINE GLUCONATE 0.12 % MT SOLN
15.0000 mL | Freq: Once | OROMUCOSAL | Status: AC
  Administered 2020-04-08: 15 mL via OROMUCOSAL
  Filled 2020-04-08: qty 15

## 2020-04-08 MED ORDER — FENTANYL CITRATE (PF) 100 MCG/2ML IJ SOLN: 25.0000 ug | INTRAMUSCULAR | Status: DC | PRN

## 2020-04-08 MED ORDER — IPRATROPIUM-ALBUTEROL 0.5-2.5 (3) MG/3ML IN SOLN
3.0000 mL | RESPIRATORY_TRACT | Status: DC
  Administered 2020-04-08: 3 mL via RESPIRATORY_TRACT

## 2020-04-08 MED ORDER — LACTATED RINGERS IV SOLN: INTRAVENOUS | Status: DC

## 2020-04-08 MED ORDER — ONDANSETRON HCL 4 MG/2ML IJ SOLN: 4.0000 mg | Freq: Once | INTRAMUSCULAR | Status: DC | PRN

## 2020-04-08 MED ORDER — ORAL CARE MOUTH RINSE: 15.0000 mL | Freq: Once | OROMUCOSAL | Status: AC

## 2020-04-08 MED ORDER — LIDOCAINE HCL (PF) 1 % IJ SOLN
INTRAMUSCULAR | Status: AC
  Filled 2020-04-08: qty 30

## 2020-04-08 MED ORDER — PROPOFOL 500 MG/50ML IV EMUL
INTRAVENOUS | Status: DC | PRN
  Administered 2020-04-08: 80 ug/kg/min via INTRAVENOUS

## 2020-04-08 MED ORDER — HEPARIN 1000 UNIT/ML FOR PERITONEAL DIALYSIS
INTRAMUSCULAR | Status: DC | PRN
  Administered 2020-04-08: 4 mL via INTRAPERITONEAL

## 2020-04-08 MED ORDER — PROPOFOL 10 MG/ML IV BOLUS
INTRAVENOUS | Status: AC
  Filled 2020-04-08: qty 40

## 2020-04-08 MED ORDER — SODIUM CHLORIDE (PF) 0.9 % IJ SOLN
INTRAMUSCULAR | Status: DC | PRN
  Administered 2020-04-08: 10 mL via INTRAVENOUS

## 2020-04-08 SURGICAL SUPPLY — 41 items
APPLICATOR CHLORAPREP 10.5 ORG (MISCELLANEOUS) ×2 IMPLANT
BAG DECANTER FOR FLEXI CONT (MISCELLANEOUS) ×2 IMPLANT
BIOPATCH RED 1 DISK 7.0 (GAUZE/BANDAGES/DRESSINGS) ×2 IMPLANT
CATH PALINDROME-P 23CM W/VT (CATHETERS) ×2 IMPLANT
COVER LIGHT HANDLE STERIS (MISCELLANEOUS) ×4 IMPLANT
COVER PROBE U/S 5X48 (MISCELLANEOUS) ×2 IMPLANT
COVER WAND RF STERILE (DRAPES) ×2 IMPLANT
DECANTER SPIKE VIAL GLASS SM (MISCELLANEOUS) ×4 IMPLANT
DERMABOND ADVANCED (GAUZE/BANDAGES/DRESSINGS) ×1
DERMABOND ADVANCED .7 DNX12 (GAUZE/BANDAGES/DRESSINGS) ×1 IMPLANT
DRAPE C-ARM FOLDED MOBILE STRL (DRAPES) ×2 IMPLANT
DRAPE CHEST BREAST 15X10 FENES (DRAPES) ×2 IMPLANT
DRSG SORBAVIEW 3.5X5-5/16 MED (GAUZE/BANDAGES/DRESSINGS) ×2 IMPLANT
ELECT REM PT RETURN 9FT ADLT (ELECTROSURGICAL) ×2
ELECTRODE REM PT RTRN 9FT ADLT (ELECTROSURGICAL) ×1 IMPLANT
GAUZE 4X4 16PLY RFD (DISPOSABLE) ×2 IMPLANT
GEL ULTRASOUND 20GR AQUASONIC (MISCELLANEOUS) ×2 IMPLANT
GLOVE SURG ENC MOIS LTX SZ6.5 (GLOVE) ×2 IMPLANT
GLOVE SURG ENC TEXT LTX SZ7 (GLOVE) ×2 IMPLANT
GLOVE SURG UNDER POLY LF SZ6.5 (GLOVE) ×2 IMPLANT
GLOVE SURG UNDER POLY LF SZ7 (GLOVE) ×4 IMPLANT
GOWN STRL REUS W/TWL LRG LVL3 (GOWN DISPOSABLE) ×4 IMPLANT
IV NS 500ML (IV SOLUTION) ×1
IV NS 500ML BAXH (IV SOLUTION) ×1 IMPLANT
KIT BLADEGUARD II DBL (SET/KITS/TRAYS/PACK) ×2 IMPLANT
KIT PALINDROME-P 55CM (CATHETERS) IMPLANT
KIT TURNOVER KIT A (KITS) ×2 IMPLANT
MARKER SKIN DUAL TIP RULER LAB (MISCELLANEOUS) ×2 IMPLANT
NEEDLE HYPO 18GX1.5 BLUNT FILL (NEEDLE) ×2 IMPLANT
NEEDLE HYPO 25X1 1.5 SAFETY (NEEDLE) ×2 IMPLANT
PACK BASIC III (CUSTOM PROCEDURE TRAY) ×1
PACK SRG BSC III STRL LF ECLPS (CUSTOM PROCEDURE TRAY) ×1 IMPLANT
PAD ARMBOARD 7.5X6 YLW CONV (MISCELLANEOUS) ×2 IMPLANT
PENCIL SMOKE EVACUATOR COATED (MISCELLANEOUS) ×2 IMPLANT
SET BASIN LINEN APH (SET/KITS/TRAYS/PACK) ×2 IMPLANT
SUT MNCRL AB 4-0 PS2 18 (SUTURE) ×2 IMPLANT
SUT SILK 2 0 FSL 18 (SUTURE) ×2 IMPLANT
SUT VIC AB 3-0 SH 27 (SUTURE) ×1
SUT VIC AB 3-0 SH 27X BRD (SUTURE) ×1 IMPLANT
SYR 10ML LL (SYRINGE) ×4 IMPLANT
SYR CONTROL 10ML LL (SYRINGE) ×2 IMPLANT

## 2020-04-08 NOTE — Care Management Important Message (Signed)
Important Message  Patient Details  Name: Taylor Willis MRN: [id number] Date of Birth: December 20, 1940   Medicare Important Message Given:  Yes     Tommy Medal 04/08/2020, 1:31 PM

## 2020-04-08 NOTE — Anesthesia Postprocedure Evaluation (Signed)
Anesthesia Post Note  Patient: Taylor Willis  Procedure(s) Performed: INSERTION OF TUNNELED DIALYSIS CATHETER (Right Chest)  Patient location during evaluation: PACU Anesthesia Type: MAC Level of consciousness: awake and awake and alert Pain management: satisfactory to patient Vital Signs Assessment: post-procedure vital signs reviewed and stable Respiratory status: spontaneous breathing, respiratory function stable and patient connected to face mask oxygen Cardiovascular status: stable Postop Assessment: no apparent nausea or vomiting Anesthetic complications: no   No complications documented.   Last Vitals:  Vitals:   04/08/20 0505 04/08/20 0906  BP: 117/68 138/81  Pulse: 78 78  Resp: 18 20  Temp: 36.7 C 36.9 C  SpO2: 96% 95%    Last Pain:  Vitals:   04/08/20 0906  TempSrc: Oral  PainSc: 0-No pain                 Faustina Gebert

## 2020-04-08 NOTE — Procedures (Signed)
   HEMODIALYSIS TREATMENT NOTE (HD#4):  Sanguinous oozing from [facility] insertion site and from wing sutures.  Had similar problem with femoral catheter which resolved within 24 hours.  Dressing was changed at beginning and again at end of treatment with pressure held along tunnel.  3.5 hour heparin-free HD completed. Goal met: 500cc removed without interruption in UF.  All blood was returned.  Rockwell Alexandria, RN

## 2020-04-08 NOTE — Progress Notes (Signed)
[facility]  Updated patient and wife that catheter in good position without PTX. Dialysis RN aware.   Curlene Labrum, MD [facility] [address] [place], [place] [address] [phone] (office)

## 2020-04-08 NOTE — Op Note (Signed)
Operative Note 04/08/20   Preoperative Diagnosis: End Stage Renal Disease    Postoperative Diagnosis: Same   Procedure(s) Performed: Tunneled Dialysis Catheter Placement, Right Internal Jugular    Surgeon: Lanell Matar. Constance Haw, MD   Assistants: No qualified resident was available   Anesthesia: Monitored anesthesia care   Anesthesiologist: Denese Killings, MD    Specimens: None   Estimated Blood Loss: Minimal   Fluoroscopy time: 3 seconds   Blood Replacement: None    Complications: None    Operative Findings: Normal anatomy   Indications:  Mr. Whitwell is a 80 yo with ESRD who is not able to dialyze through his fistula due to torturous nature. We discussed catheter placement and risk of bleeding, infection, pneumthorax, injury to the vessels.    Procedure: The patient was brought into the operating room and monitored anesthesia was induced.   The right chest and neck was prepped and draped in the usual sterile fashion.  Preoperative antibiotics were given.   An Ultrasound was used to verify that the right internal jugular vein was patent.  One percent lidocaine was used for local anesthesia.  The patient was measured and a 23 cm Palindrome dual lumen dialysis catheter.  The needles advanced into the right internal jugular vein using the Seldinger technique without difficulty.  A guidewire was then advanced into the right atrium under fluoroscopic guidance.  Ectopia was not noted. The wire was secured.  An incision was made over the right chest and the catheter was tunneled to the neck.  The ultrasound again confirmed the wire was going into the vein only. Dilators were used over the wire to dilate the track.  An introducer and peel-away sheath were placed over the guidewire. The catheter was then inserted through the peel-away sheath and the peel-away sheath was removed.  A spot film was performed to confirm the position.  The catheter drew back and flushed easily. The lumens were packed  with heparin. Hemostats were used to position the catheter in the neck incision. The neck incision was closed with 4-0 Monocryl and Dermabond. The catheter was secured with 2-0 silk suture and a sterile Biopatch and dressing was applied.  Hemostasis was confirmed.     All tape and needle counts were correct at the end of the procedure. The patient was transferred to PACU in stable condition. A chest x-ray will be performed at that time.  Curlene Labrum, MD [facility] [address] [place], [place] [address] [phone] (office)

## 2020-04-08 NOTE — Progress Notes (Signed)
[facility]  See Consult note from 04/06/2020. Epic was not allowing the normal Update H&P action in his chart.  No changes. Discussed dialysis line placement. Discussed possibly having to do under local if anesthesia is not comfortable doing sedation due to this wheezing.   Patient in agreement.  Curlene Labrum, MD [facility] [address] [place],  [address] [phone] (office)

## 2020-04-08 NOTE — Progress Notes (Signed)
PROGRESS NOTE    Taylor Willis  KDX:[id number] DOB: 21-Dec-1940 DOA: 04/04/2020 PCP: Deloria Lair., MD   Chief complaint: Acute respiratory failure with hypoxia in the setting of pulmonary edema and vascular congestion.  Brief admission Narrative:  Taylor Willis is a 80 y.o. male with medical history significant of prostate cancer, history of asthma clinically disease stage V (close to require hemodialysis as deformity reports), hyperlipidemia, hypertension and gout; who presented to the hospital secondary to shortness of breath, hypoxia, orthopnea and decreasing urine output.  Patient actively Followed by Dr. August Albino as an outpatient for his renal failure and looking to start HD on 04/04/20.  Unfortunately since Sunday he has experienced ongoing progressive shortness of breath, dyspnea on exertion and orthopnea.  He presented to [facility] where he was found to have pulmonary edema on chest x-ray, hypoxic in the mid 80s on room air with requirement for BiPAP initiation.  Patient denies fever, chills, hemoptysis, dysuria, hematuria, chest pain, nausea, vomiting, sick contacts or any other complaints.  Covid screening test done at [facility] negative.  His blood work demonstrated metabolic acidosis, BNP more than 35,000 and worsening creatinine/BUN levels.  His hemoglobin decreased to 7.7 from 9's range on previous blood work. No overt bleeding reported.  ED Course: Patient was started on BiPAP and transfer to antipain the hospital stepdown bed for further evaluation, management and dialysis initiation.  Assessment & Plan: 1-acute respiratory failure with hypoxia -In the setting of pulmonary edema and vascular congestion -Continue diuretics as dictated by nephrology  -continue to wean off oxygen as tolerated; checking desat screening; using 2 L nasal cannula supplementation currently. -Planning for next HD tx on 04/08/20 today after tunneled cath placed..  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 -Continue to follow nephrology service recommendation -Continue plan for maximum ultrafiltration -Renal diet and fluid restriction orders in place. -Follow daily weights.  3-gastroesophageal flux disease -Continue PPI  4-essential hypertension -Continue current antihypertensive agents -Blood pressure stable  5-secondary hyperparathyroidism -Continue PhosLo  6-mild hypokalemia -Continue to follow electrolytes trend.   -Replete and adjust K bath with HD as needed.  7-anemia of chronic kidney disease -No signs of overt bleeding -Continue IV iron and Epogen therapy as per nephrology discretion.  8-hx of asthma -No wheezing, no use of accessory muscles, improved bilaterally. -will continue PRN nebulizer bronchodilators.  DVT prophylaxis: Heparin Code Status: Full code Family Communication: Wife at bedside. Disposition:   Status is: Inpatient  Dispo: The patient is from: home              Anticipated d/c is to: home              Patient currently not medically stable for discharge. Continue HD therapy and volume stabilization. Requiring 2L HFNC.  Tunneled hemodialysis catheter placemed today; pernephrology service will performed HD #4 today; follow response; most likely home in am.   Difficult to place patient noe   Consultants:   Nephrology service.   Procedures:  HD See below for x-ray reports.    Antimicrobials:  None    Subjective: Continue using 2L [place] supplementation, no CP, no nausea, no vomiting. Still confused by wife reports; but more alert and interactive.  Objective: Vitals:   04/08/20 1030 04/08/20 1045 04/08/20 1100 04/08/20 1137  BP: 122/71 137/79  137/75  Pulse: 73 70 70 71  Resp: 17 20 17 20   Temp: 98.4 F (36.9 C)   98 F (36.7  C)  TempSrc:    Oral  SpO2: 100% 100% 93% 95%  Weight:      Height:        Intake/Output Summary (Last 24 hours) at 04/08/2020 1412 Last  data filed at 04/08/2020 1156 Gross per 24 hour  Intake 340 ml  Output 1005 ml  Net -665 ml   Filed Weights   04/04/20 1850 04/05/20 1000 04/07/20 0400  Weight: 92.2 kg 89.7 kg 88.1 kg    Examination: General exam: Alert, awake, oriented x 3; no CP, no nausea, no vomiting. Patient is afebrile. Using 2L [place]. More interactive and alert; still slightly confused as per wife reports. Respiratory system: Clear to auscultation. Respiratory effort normal. No crackles. Cardiovascular system:RRR. No murmurs, rubs, gallops.no JVD. Gastrointestinal system: Abdomen is nondistended, soft and nontender. No organomegaly or masses felt. Normal bowel sounds heard. Central nervous system: Alert and oriented. No focal neurological deficits. Extremities: No cyanosis, no clubbing. Skin: No petechiae.  Psychiatry: Judgement and insight appear normal. Mood & affect appropriate.    Data Reviewed: I have personally reviewed following labs and imaging studies  CBC: Recent Labs  Lab 04/04/20 1121 04/05/20 0304 04/05/20 0818 04/06/20 1030 04/08/20 0518  WBC 7.8 8.8  --  15.1* 14.9*  NEUTROABS  --   --   --   --  12.3*  HGB 7.3* 7.4* 7.9* 7.6* 8.0*  HCT 22.2* 22.3* 23.7* 24.0* 25.0*  MCV 96.1 95.7  --  98.8 98.4  PLT 184 204  --  188 099    Basic Metabolic Panel: Recent Labs  Lab 04/04/20 1121 04/04/20 1528 04/05/20 0304 04/08/20 0518  NA 128*  --  129* 133*  K 4.1  --  4.0 4.0  CL 90*  --  92* 95*  CO2 21*  --  22 23  GLUCOSE 170*  --  149* 112*  BUN 103*  --  87* 95*  CREATININE 8.69*  --  7.49* 7.70*  CALCIUM 8.4* 8.9 8.7* 8.8*  MG 2.4  --   --   --   PHOS 7.4*  --  7.1* 6.7*    GFR: Estimated Creatinine Clearance: 8.3 mL/min (A) (by C-G formula based on SCr of 7.7 mg/dL (H)).  Liver Function Tests: Recent Labs  Lab 04/04/20 1121 04/05/20 0304 04/08/20 0518  AST 28  --   --   ALT 36  --   --   ALKPHOS 51  --   --   BILITOT 0.9  --   --   PROT 7.0  --   --   ALBUMIN 3.0*  3.3* 3.0*    CBG: No results for input(s): GLUCAP in the last 168 hours.   Recent Results (from the past 240 hour(s))  MRSA PCR Screening     Status: None   Collection Time: 04/04/20 11:52 AM   Specimen: Nasal Mucosa; Nasopharyngeal  Result Value Ref Range Status   MRSA by PCR NEGATIVE NEGATIVE Final    Comment:        The GeneXpert MRSA Assay (FDA approved for NASAL specimens only), is one component of a comprehensive MRSA colonization surveillance program. It is not intended to diagnose MRSA infection nor to guide or monitor treatment for MRSA infections. Performed at [facility], [address] [place], [place] [address]   Surgical pcr screen     Status: None   Collection Time: 04/07/20  3:40 PM   Specimen: Nasal Mucosa; Nasal Swab  Result Value Ref Range Status  MRSA, PCR NEGATIVE NEGATIVE Final   Staphylococcus aureus NEGATIVE NEGATIVE Final    Comment: (NOTE) The Xpert SA Assay (FDA approved for NASAL specimens in patients 30 years of age and older), is one component of a comprehensive surveillance program. It is not intended to diagnose infection nor to guide or monitor treatment. Performed at [facility], [address] [place], [place] [address]      Radiology Studies: [facility] Chest [facility] 1 View  Result Date: 04/08/2020 CLINICAL DATA:  Hemodialysis catheter placement EXAM: PORTABLE CHEST 1 VIEW COMPARISON:  04/07/2020 FINDINGS: Interval placement of right internal jugular approach hemodialysis catheter with distal tip terminating within the superior vena cava. Stable cardiomediastinal contours. Atherosclerotic calcification of the aortic knob. Pulmonary vascular congestion with diffuse interstitial prominence, similar to prior. Small bilateral pleural effusions. No pneumothorax. IMPRESSION: 1. Interval placement of right internal jugular approach dialysis catheter with distal tip terminating within the superior vena cava. No pneumothorax. 2. Persistent findings of  pulmonary edema, similar to prior. Electronically Signed   By: Davina Poke D.O.   On: 04/08/2020 10:57   DG Chest Port 1 View  Result Date: 04/07/2020 CLINICAL DATA:  Pulmonary edema, acute respiratory failure EXAM: PORTABLE CHEST 1 VIEW COMPARISON:  04/04/2020 FINDINGS: Pulmonary insufflation is stable. Diffuse interstitial pulmonary infiltrate persists, improved since prior examination, in keeping with mild to moderate pulmonary edema. Small bilateral pleural effusions are present, left greater than right. Cardiac size is within normal limits. IMPRESSION: Mild to moderate pulmonary edema, improved since prior examination Electronically Signed   By: Fidela Salisbury MD   On: 04/07/2020 06:30   DG C-Arm 1-60 Min-No Report  Result Date: 04/08/2020 Fluoroscopy was utilized by the requesting physician.  No radiographic interpretation.     Scheduled Meds: . calcium acetate  667 mg Oral BID WC  . carvedilol  3.125 mg Oral BID WC  . Chlorhexidine Gluconate Cloth  6 each Topical Q0600  . Chlorhexidine Gluconate Cloth  6 each Topical Q0600  . darbepoetin (ARANESP) injection - DIALYSIS  60 mcg Intravenous Q Fri-HD  . heparin  5,000 Units Subcutaneous Q8H  . sodium chloride flush  3 mL Intravenous Q12H  . vitamin B-12  1,000 mcg Oral Daily   Continuous Infusions: . sodium chloride    . sodium chloride    . sodium chloride    . ferric gluconate (FERRLECIT/NULECIT) IV 125 mg (04/06/20 1126)  . lactated ringers 50 mL/hr at 04/08/20 1347     LOS: 4 days    Time spent: 30 minutes   Barton Dubois, MD [facility]   To contact the attending provider between 7A-7P or the covering provider during after hours 7P-7A, please log into the web site [email or web address] and access using universal [place] password for that web site. If you do not have the password, please call the hospital operator.  04/08/2020, 2:12 PM

## 2020-04-08 NOTE — Anesthesia Preprocedure Evaluation (Signed)
Anesthesia Evaluation  Patient identified by MRN, date of birth, ID band Patient awake    Reviewed: Allergy & Precautions, NPO status , Patient's Chart, lab work & pertinent test results, reviewed documented beta blocker date and time   History of Anesthesia Complications Negative for: history of anesthetic complications  Airway Mallampati: II  TM Distance: >3 FB Neck ROM: Full    Dental  (+) Edentulous Upper, Edentulous Lower, Dental Advisory Given   Pulmonary shortness of breath (on oxygen 3l), with exertion and at rest, asthma , former smoker,   Patient has pulmonary edema, on oxygen 2l, wheezing resolved after treatment.  breath sounds clear to auscultation + wheezing      Cardiovascular Exercise Tolerance: Poor hypertension, Pt. on medications and Pt. on home beta blockers Normal cardiovascular exam Rhythm:Regular Rate:Normal  07-Apr-2020 12:21:27 [facility]-AP-ICU ROUTINE RECORD Sinus rhythm with sinus arrhythmia with 1st degree A-V block Left ventricular hypertrophy with QRS widening and repolarization abnormality ( Cornell product ) Abnormal ECG Since last tracing Marked ST abnormality, possible lateral subendocardial injury Confirmed by Skeet Latch [phone]) on 04/07/2020 5:29:58 PM   Neuro/Psych    GI/Hepatic GERD  Controlled,  Endo/Other    Renal/GU Dialysis and ESRFRenal disease Bladder dysfunction (prostate cancer)      Musculoskeletal  (+) Arthritis ,   Abdominal   Peds  Hematology   Anesthesia Other Findings   Reproductive/Obstetrics                             Anesthesia Physical Anesthesia Plan  ASA: IV  Anesthesia Plan: MAC   Post-op Pain Management:    Induction: Intravenous  PONV Risk Score and Plan: TIVA and Propofol infusion  Airway Management Planned: Nasal Cannula and Natural Airway  Additional Equipment:   Intra-op Plan:    Post-operative Plan:   Informed Consent: I have reviewed the patients History and Physical, chart, labs and discussed the procedure including the risks, benefits and alternatives for the proposed anesthesia with the patient or authorized representative who has indicated his/her understanding and acceptance.       Plan Discussed with: CRNA and Surgeon  Anesthesia Plan Comments:         Anesthesia Quick Evaluation

## 2020-04-08 NOTE — Transfer of Care (Signed)
Immediate Anesthesia Transfer of Care Note  Patient: Taylor Willis  Procedure(s) Performed: INSERTION OF TUNNELED DIALYSIS CATHETER (Right Chest)  Patient Location: PACU  Anesthesia Type:MAC  Level of Consciousness: awake, alert  and patient cooperative  Airway & Oxygen Therapy: Patient Spontanous Breathing and Patient connected to face mask oxygen  Post-op Assessment: Report given to RN, Post -op Vital signs reviewed and stable and Patient moving all extremities X 4  Post vital signs: Reviewed and stable  Last Vitals:  Vitals Value Taken Time  BP 122/71 04/08/20 1030  Temp    Pulse 74 04/08/20 1031  Resp 17 04/08/20 1031  SpO2 89 % 04/08/20 1031  Vitals shown include unvalidated device data.  Last Pain:  Vitals:   04/08/20 0906  TempSrc: Oral  PainSc: 0-No pain         Complications: No complications documented.

## 2020-04-08 NOTE — Progress Notes (Signed)
Admit: 04/04/2020 LOS: 4  50M new ESRD was starting HD [facility] but developed acute hypoxic RF prior to initiation  Subjective:  Moved to 300-  Conversant today-  Was not yesterday, knew 2022 .  and plan is to place [facility] today followed by HD #4    03/06 0701 - 03/07 0700 In: 240 [P.O.:240] Out: 600 [Urine:600]  Filed Weights   04/04/20 1850 04/05/20 1000 04/07/20 0400  Weight: 92.2 kg 89.7 kg 88.1 kg    Scheduled Meds: . calcium acetate  667 mg Oral BID WC  . carvedilol  3.125 mg Oral BID WC  . Chlorhexidine Gluconate Cloth  6 each Topical Q0600  . Chlorhexidine Gluconate Cloth  6 each Topical Q0600  . darbepoetin (ARANESP) injection - DIALYSIS  60 mcg Intravenous Q Fri-HD  . heparin  5,000 Units Subcutaneous Q8H  . sodium chloride flush  3 mL Intravenous Q12H  . vitamin B-12  1,000 mcg Oral Daily   Continuous Infusions: . sodium chloride    . sodium chloride    . sodium chloride    .  ceFAZolin (ANCEF) IV    . ferric gluconate (FERRLECIT/NULECIT) IV 125 mg (04/06/20 1126)   PRN Meds:.sodium chloride, sodium chloride, sodium chloride, acetaminophen, alteplase, heparin, ipratropium-albuterol, ondansetron **OR** ondansetron (ZOFRAN) IV, sodium chloride flush  Current Labs: reviewed  Results for SHONDELL, POULSON (MRN [id number]) as of 04/05/2020 08:25  Ref. Range 04/05/2020 03:04  Saturation Ratios Latest Ref Range: 17.9 - 39.5 % 17 (L)  Ferritin Latest Ref Range: 24 - 336 ng/mL 271    Physical Exam:  Blood pressure 117/68, pulse 78, temperature 98.1 F (36.7 C), resp. rate 18, height 5\' 11"  (1.803 m), weight 88.1 kg, SpO2 96 %. Sleeping but when awoke seems less confused "how are you?" RRR  Diminished in bases, mild inc WOB S/ntn AVF with weak B/T, bandaged No LEE S/nt NCAT EOMI  A 1. ESRD, new; CLIP is to [employer] [facility] Shift; we think is already set up  1. developed resp failure prior to first outpt HD,  2. started HD here 3/3 3. Failed cannulation of  AVF 3/3, req R fem TEMP HD cath with Dr. Constance Haw- then done 3 days in row 3/3, 3/4 and 3/5-  Will plan for treatment #4 today after TDC placed-     2. Acute hypoxic RF now on [place] but uses no O2 at home, per TRH;  diuretics given through the weekend to augment UOP although none really recorded so will stop diuretics-  Have O2 turned down to 3 liters yesterday.  Does not look like he has that much volume on-  Cont to wean O2 as able 3. Anemia of CKD, Hb 7s.  ESA , Started IV Fe as well.  No hx/o CAD so prob ok not to tranfuse unless < 7 4. CKD-BMD 1. Trend P with HD start; PhosLo 2. PTH 133 5. HTN/Vol: as above 6. Hyponatremia, mild, will correc with HD 7. Confusion-  ICU delerium ?  Uremia ? Better today     Taylor Willis  04/08/2020, 7:57 AM  Recent Labs  Lab 04/04/20 1121 04/04/20 1528 04/05/20 0304 04/08/20 0518  NA 128*  --  129* 133*  K 4.1  --  4.0 4.0  CL 90*  --  92* 95*  CO2 21*  --  22 23  GLUCOSE 170*  --  149* 112*  BUN 103*  --  87* 95*  CREATININE 8.69*  --  7.49* 7.70*  CALCIUM 8.4* 8.9 8.7* 8.8*  PHOS 7.4*  --  7.1* 6.7*   Recent Labs  Lab 04/05/20 0304 04/05/20 0818 04/06/20 1030 04/08/20 0518  WBC 8.8  --  15.1* 14.9*  NEUTROABS  --   --   --  12.3*  HGB 7.4* 7.9* 7.6* 8.0*  HCT 22.3* 23.7* 24.0* 25.0*  MCV 95.7  --  98.8 98.4  PLT 204  --  188 208

## 2020-04-09 ENCOUNTER — Encounter (HOSPITAL_COMMUNITY): Payer: Self-pay | Admitting: General Surgery

## 2020-04-09 DIAGNOSIS — K219 Gastro-esophageal reflux disease without esophagitis: Secondary | ICD-10-CM | POA: Diagnosis not present

## 2020-04-09 DIAGNOSIS — Z8709 Personal history of other diseases of the respiratory system: Secondary | ICD-10-CM | POA: Diagnosis not present

## 2020-04-09 DIAGNOSIS — N185 Chronic kidney disease, stage 5: Secondary | ICD-10-CM | POA: Diagnosis not present

## 2020-04-09 DIAGNOSIS — J9601 Acute respiratory failure with hypoxia: Secondary | ICD-10-CM | POA: Diagnosis not present

## 2020-04-09 MED ORDER — HYDROMORPHONE HCL 1 MG/ML IJ SOLN: 0.5000 mg | INTRAMUSCULAR | Status: DC | PRN

## 2020-04-09 NOTE — Evaluation (Signed)
Physical Therapy Evaluation Patient Details Name: Taylor Willis MRN: [id number] DOB: [birth year] Today's Date: 04/09/2020   History of Present Illness  Taylor Willis is a 80 y.o. male with medical history significant of prostate cancer, asthma, CKD, HTN and gout with c/o shortness of breath, hypoxia, orthopnea and decreasing urine output.  Clinical Impression  Pt admitted with above diagnosis. PTA, pt independent with mobility, no DME at home, no falls in the last 6 months. Currently pt requiring min-mod A with transfers and ambulation, 2 LOB requiring mod assist to recover. Pt without SOB noted, SpO2 >90% on RA. Discussed short term rehab vs 24 hr assist with pt and spouse, both unsure of desire at this time. Pt would benefit from OT eval due to previously Ind with IADLs/ADLs. Pt currently with functional limitations due to the deficits listed below (see PT Problem List). Pt will benefit from skilled PT to increase their independence and safety with mobility to allow discharge to the venue listed below.       Follow Up Recommendations SNF (if pt refuses, home with 24 hr assist and max HH services)    Equipment Recommendations  Rolling walker with 5" wheels;3in1 (PT)    Recommendations for Other Services OT consult     Precautions / Restrictions Precautions Precautions: Fall Restrictions Weight Bearing Restrictions: No      Mobility  Bed Mobility Overal bed mobility: Modified Independent  General bed mobility comments: increased time to come to EOB, HOB elevated    Transfers Overall transfer level: Needs assistance Equipment used: 1 person hand held assist (IV pole) Transfers: Sit to/from Stand Sit to Stand: Min assist    General transfer comment: min A to power up to standing with bil hands assisting, BLE braced against front of bed, unsteady upon rising  Ambulation/Gait Ambulation/Gait assistance: Min assist;Mod assist Gait Distance (Feet): 25 Feet Assistive device: IV  Pole;Rolling walker (2 wheeled) Gait Pattern/deviations: Step-through pattern;Decreased stride length Gait velocity: decreased   General Gait Details: pt with short steps, very unsteady on feet with staggering both directions while using IV pole, provided RW with minimal improvement in balance, requiring min A to maneuver RW appropriatley, 2 LOB requiring moderate assist from therapist to recover, pt denies SOB, pain, or dizziness  Stairs            Wheelchair Mobility    Modified Rankin (Stroke Patients Only)       Balance Overall balance assessment: Needs assistance Sitting-balance support: Feet supported;No upper extremity supported Sitting balance-Leahy Scale: Good Sitting balance - Comments: seated EOB   Standing balance support: During functional activity Standing balance-Leahy Scale: Poor Standing balance comment: reliant on UE support, moderate A with LOB to recover       Pertinent Vitals/Pain Pain Assessment: No/denies pain    Home Living Family/patient expects to be discharged to:: Private residence Living Arrangements: Spouse/significant other Available Help at Discharge: Family;Available PRN/intermittently Type of Home: House Home Access: Stairs to enter Entrance Stairs-Rails: None Entrance Stairs-Number of Steps: 1 Home Layout: Multi-level;Bed/bath upstairs (enter at kitchen and living room, bed/bath upstairs) Home Equipment: None      Prior Function Level of Independence: Independent         Comments: Pt reports being independent with community ambulation, no DME, drives, independent with ADLs, denies falls in last 6 months.     Hand Dominance        Extremity/Trunk Assessment   Upper Extremity Assessment Upper Extremity Assessment: Overall WFL for tasks assessed  Lower Extremity Assessment Lower Extremity Assessment: Overall WFL for tasks assessed (AROM WNL, strength 4+/5 throughout)    Cervical / Trunk Assessment Cervical /  Trunk Assessment: Normal  Communication   Communication: No difficulties  Cognition Arousal/Alertness: Awake/alert Behavior During Therapy: WFL for tasks assessed/performed Overall Cognitive Status: Within Functional Limits for tasks assessed  General Comments: Pt appears slightly confused, reports year is "60, no 70, no 80, I don't know", president is "Taylor Willis" and location is "hospital" but unable to state [facility] with cues. Pt's spouse in room, states pt is joking while answering questions.      General Comments General comments (skin integrity, edema, etc.): on RA with SpO2 >90%    Exercises     Assessment/Plan    PT Assessment Patient needs continued PT services  PT Problem List Decreased activity tolerance;Decreased balance;Decreased knowledge of use of DME;Decreased safety awareness       PT Treatment Interventions DME instruction;Gait training;Functional mobility training;Therapeutic activities;Therapeutic exercise;Balance training;Neuromuscular re-education;Patient/family education    PT Goals (Current goals can be found in the Care Plan section)  Acute Rehab PT Goals Patient Stated Goal: home vs SNF, pt and spouse unsure PT Goal Formulation: With patient/family Time For Goal Achievement: 04/23/20 Potential to Achieve Goals: Good    Frequency Min 3X/week   Barriers to discharge        Co-evaluation               AM-PAC PT "6 Clicks" Mobility  Outcome Measure Help needed turning from your back to your side while in a flat bed without using bedrails?: None Help needed moving from lying on your back to sitting on the side of a flat bed without using bedrails?: A Little Help needed moving to and from a bed to a chair (including a wheelchair)?: A Little Help needed standing up from a chair using your arms (e.g., wheelchair or bedside chair)?: A Little Help needed to walk in hospital room?: A Lot Help needed climbing 3-5 steps with a railing? : A Lot 6 Click  Score: 17    End of Session Equipment Utilized During Treatment: Gait belt Activity Tolerance: Patient limited by fatigue Patient left: in chair;with call bell/phone within reach;with nursing/sitter in room;with family/visitor present Nurse Communication: Mobility status PT Visit Diagnosis: Unsteadiness on feet (R26.81);Other abnormalities of gait and mobility (R26.89)    Time: 1310-1335 PT Time Calculation (min) (ACUTE ONLY): 25 min   Charges:   PT Evaluation $PT Eval Low Complexity: 1 Low PT Treatments $Gait Training: 8-22 mins         Tori Maisley Hainsworth PT, DPT 04/09/20, 1:52 PM

## 2020-04-09 NOTE — Progress Notes (Signed)
Admit: 04/04/2020 LOS: 5  66M new ESRD was starting HD [facility] but developed acute hypoxic RF prior to initiation  Subjective: Pt more alert, knew 2022 and March-  Said he misses his wife.  Remembers dialysis yesterday- is hungry     03/07 0701 - 03/08 0700 In: 689.4 [P.O.:360; I.V.:329.4] Out: 905 [Urine:400; Blood:5]  Filed Weights   04/04/20 1850 04/05/20 1000 04/07/20 0400  Weight: 92.2 kg 89.7 kg 88.1 kg    Scheduled Meds: . calcium acetate  667 mg Oral BID WC  . carvedilol  3.125 mg Oral BID WC  . Chlorhexidine Gluconate Cloth  6 each Topical Q0600  . darbepoetin (ARANESP) injection - DIALYSIS  60 mcg Intravenous Q Fri-HD  . heparin  5,000 Units Subcutaneous Q8H  . sodium chloride flush  3 mL Intravenous Q12H  . vitamin B-12  1,000 mcg Oral Daily   Continuous Infusions: . sodium chloride    . sodium chloride    . sodium chloride    . ferric gluconate (FERRLECIT/NULECIT) IV 125 mg (04/06/20 1126)  . lactated ringers 50 mL/hr at 04/08/20 1347   PRN Meds:.sodium chloride, sodium chloride, sodium chloride, acetaminophen, alteplase, heparin, HYDROmorphone (DILAUDID) injection, ipratropium-albuterol, ondansetron **OR** ondansetron (ZOFRAN) IV, sodium chloride flush  Current Labs: reviewed  Results for BROGAN, MARTIS (MRN [id number]) as of 04/05/2020 08:25  Ref. Range 04/05/2020 03:04  Saturation Ratios Latest Ref Range: 17.9 - 39.5 % 17 (L)  Ferritin Latest Ref Range: 24 - 336 ng/mL 271    Physical Exam:  Blood pressure 122/66, pulse 97, temperature 98 F (36.7 C), resp. rate 20, height 5\' 11"  (1.803 m), weight 88.1 kg, SpO2 97 %. More alert still , knew March and 2022-  O2 was off, sat was 92-  I kept it off RRR  Diminished in bases, mild inc WOB S/ntn AVF with weak B/T, bandaged No LEE S/nt NCAT EOMI  A 1. ESRD, new; CLIP is to [employer] [facility] 2nd Shift  1. developed resp failure prior to first outpt HD,  2. started HD here 3/3 3. Failed cannulation of  AVF 3/3, req R fem TEMP HD cath with Dr. Constance Haw- then done 3 days in row 3/3, 3/4 and 3/5-  Treatment #4 on 3/7- will plan next for 3/10 to get on schedule-  Could be here or at OP center    2. Acute hypoxic RF now on [place] but uses no O2 at home-    Had O2 turned down to 2 liters yesterday.  Does not look like he has that much volume on-  Cont to wean O2 as able-  Was off O2 this AM with sat of 92% 3. Anemia of CKD, Hb 7s.  ESA , Started IV Fe as well.  No hx/o CAD so prob ok not to tranfuse unless < 7- last was at 8 4. CKD-BMD 1. Trend P with HD start; PhosLo 2. PTH 133 5. HTN/Vol: seems to be at EDW I would say 6. Hyponatremia, correcting with HD 7. Confusion-  ICU delerium ?  Uremia ? Better again today  8. Dispo-  As far as dialysis goes, he could be discharged any time to go to OP unit on Thursday.  Getting him more mobile and off O2 will be the issue and to decide if he needs placement or can return home    Taylor Willis  04/09/2020, 7:36 AM  Recent Labs  Lab 04/04/20 1121 04/04/20 1528 04/05/20 0304 04/08/20 0518  NA  128*  --  129* 133*  K 4.1  --  4.0 4.0  CL 90*  --  92* 95*  CO2 21*  --  22 23  GLUCOSE 170*  --  149* 112*  BUN 103*  --  87* 95*  CREATININE 8.69*  --  7.49* 7.70*  CALCIUM 8.4* 8.9 8.7* 8.8*  PHOS 7.4*  --  7.1* 6.7*   Recent Labs  Lab 04/05/20 0304 04/05/20 0818 04/06/20 1030 04/08/20 0518  WBC 8.8  --  15.1* 14.9*  NEUTROABS  --   --   --  12.3*  HGB 7.4* 7.9* 7.6* 8.0*  HCT 22.3* 23.7* 24.0* 25.0*  MCV 95.7  --  98.8 98.4  PLT 204  --  188 208

## 2020-04-09 NOTE — NC FL2 (Signed)
[place] LEVEL OF CARE SCREENING TOOL     IDENTIFICATION  Patient Name: Taylor Willis Birthdate: [birth year] Sex: male Admission Date (Current Location): 04/04/2020  [facility] and [place] Number:  [employer] and Address:  [place] [address] [place]      Provider Number: [id number]  Attending Physician Name and Address:  Barton Dubois, MD  Relative Name and Phone Number:  Lukasz, Rogus [facility])   [phone]    Current Level of Care: Hospital Recommended Level of Care: [place] Prior Approval Number:    Date Approved/Denied:   PASRR Number: [id number]  Discharge Plan: SNF    Current Diagnoses: Patient Active Problem List   Diagnosis Date Noted  . Acute respiratory failure with hypoxia ([place]) 04/04/2020  . Pulmonary edema 04/04/2020  . GERD (gastroesophageal reflux disease) 04/04/2020  . Prostate cancer ([place]) 04/04/2020  . History of asthma 04/04/2020  . CKD (chronic kidney disease) stage 5, GFR less than 15 ml/min (HCC) 10/12/2019  . Ureteral calculus 05/23/2012    Orientation RESPIRATION BLADDER Height & Weight     Self,Time,Situation,Place  Normal Continent Weight: 194 lb 3.6 oz (88.1 kg) Height:  5\' 11"  (180.3 cm)  BEHAVIORAL SYMPTOMS/MOOD NEUROLOGICAL BOWEL NUTRITION STATUS      Continent Diet (renal with fluid restriction. Fluid restriction 1258mL fluid)  AMBULATORY STATUS COMMUNICATION OF NEEDS Skin   Extensive Assist Verbally Normal                       Personal Care Assistance Level of Assistance  Bathing,Feeding,Dressing Bathing Assistance: Limited assistance Feeding assistance: Independent Dressing Assistance: Limited assistance     Functional Limitations Info  Sight,Hearing,Speech Sight Info: Adequate Hearing Info: Adequate Speech Info: Adequate    SPECIAL CARE FACTORS FREQUENCY  PT (By licensed PT)     PT Frequency: 5x/week              Contractures  Contractures Info: Not present    Additional Factors Info  Code Status,Allergies Code Status Info: Full Code Allergies Info: Aspirin, Iburpofen, Hydrochlorothiazide           Current Medications (04/09/2020):  This is the current hospital active medication list Current Facility-Administered Medications  Medication Dose Route Frequency Provider Last Rate Last Admin  . 0.9 %  sodium chloride infusion  250 mL Intravenous PRN Barton Dubois, MD 10 mL/hr at 04/09/20 1211 250 mL at 04/09/20 1211  . 0.9 %  sodium chloride infusion  100 mL Intravenous PRN Barton Dubois, MD      . 0.9 %  sodium chloride infusion  100 mL Intravenous PRN Barton Dubois, MD      . acetaminophen (TYLENOL) tablet 1,000 mg  1,000 mg Oral Q6H PRN Barton Dubois, MD   1,000 mg at 04/05/20 1821  . alteplase (CATHFLO ACTIVASE) injection 2 mg  2 mg Intracatheter Once PRN Barton Dubois, MD      . calcium acetate (PHOSLO) capsule 667 mg  667 mg Oral BID WC Barton Dubois, MD   667 mg at 04/09/20 0800  . carvedilol (COREG) tablet 3.125 mg  3.125 mg Oral BID WC Virl Cagey, MD   3.125 mg at 04/09/20 0800  . Chlorhexidine Gluconate Cloth 2 % PADS 6 each  6 each Topical Q0600 Barton Dubois, MD   6 each at 04/09/20 [phone]  . Darbepoetin Alfa (ARANESP) injection 60 mcg  60 mcg Intravenous Q Fri-HD Barton Dubois, MD   [phone]  mcg at 04/05/20 1100  . ferric gluconate (NULECIT) 125 mg in sodium chloride 0.9 % 100 mL IVPB  125 mg Intravenous Q T,Th,Sa-HD Barton Dubois, MD 110 mL/hr at 04/09/20 1212 125 mg at 04/09/20 1212  . heparin injection 1,000 Units  1,000 Units Dialysis PRN Barton Dubois, MD   3,100 Units at 04/05/20 1250  . heparin injection 5,000 Units  5,000 Units Subcutaneous Q8H Barton Dubois, MD   5,000 Units at 04/09/20 1204  . HYDROmorphone (DILAUDID) injection 0.5 mg  0.5 mg Intravenous Q3H PRN Virl Cagey, MD      . ipratropium-albuterol (DUONEB) 0.5-2.5 (3) MG/3ML nebulizer solution 3 mL  3 mL Nebulization  Q4H PRN Barton Dubois, MD   3 mL at 04/09/20 0017  . lactated ringers infusion   Intravenous Continuous Denese Killings, MD 50 mL/hr at 04/08/20 1347 New Bag at 04/08/20 1347  . ondansetron (ZOFRAN) tablet 4 mg  4 mg Oral Q6H PRN Barton Dubois, MD       Or  . ondansetron [facility]) injection 4 mg  4 mg Intravenous Q6H PRN Barton Dubois, MD   4 mg at 04/05/20 0755  . sodium chloride flush (NS) 0.9 % injection 3 mL  3 mL Intravenous Q12H Barton Dubois, MD   3 mL at 04/09/20 0803  . sodium chloride flush (NS) 0.9 % injection 3 mL  3 mL Intravenous PRN Barton Dubois, MD      . vitamin B-12 (CYANOCOBALAMIN) tablet 1,000 mcg  1,000 mcg Oral Daily Barton Dubois, MD   1,000 mcg at 04/09/20 0803     Discharge Medications: Please see discharge summary for a list of discharge medications.  Relevant Imaging Results:  Relevant Lab Results:   Additional Information SSN [id number]. Patient has HD on TTS at 11:45 at [facility].  [place], Clydene Pugh, LCSW

## 2020-04-09 NOTE — TOC Progression Note (Signed)
Transition of Care [facility]) - Progression Note    Patient Details  Name: Taylor Willis MRN: [id number] Date of Birth: 1940/07/25  Transition of Care [facility] CM/SW Contact  Ihor Gully, LCSW Phone Number: 04/09/2020, 2:06 PM  Clinical Narrative:    PT recommends SNF. Spouse at bedside. Agreeable to SNF. Wants [facility] facilities. Patient's HD is TTS at [facility] in [place].    Expected Discharge Plan: Home/Self Care Barriers to Discharge: Continued Medical Work up  Expected Discharge Plan and Services Expected Discharge Plan: Home/Self Care In-house Referral: Clinical Social Work   Post Acute Care Choice: Dialysis Living arrangements for the past 2 months: Single Family Home                                       Social Determinants of Health (SDOH) Interventions    Readmission Risk Interventions No flowsheet data found.

## 2020-04-09 NOTE — Plan of Care (Signed)
  Problem: Acute Rehab PT Goals(only PT should resolve) Goal: Patient Will Transfer Sit To/From Stand Outcome: Progressing Flowsheets (Taken 04/09/2020 1405) Patient will transfer sit to/from stand: with min guard assist Goal: Pt Will Transfer Bed To Chair/Chair To Bed Outcome: Progressing Flowsheets (Taken 04/09/2020 1405) Pt will Transfer Bed to Chair/Chair to Bed: min guard assist Goal: Pt Will Ambulate Outcome: Progressing Flowsheets (Taken 04/09/2020 1405) Pt will Ambulate:  > 125 feet  with minimal assist  with rolling walker   Tori Fama Muenchow PT, DPT 04/09/20, 2:05 PM

## 2020-04-09 NOTE — Progress Notes (Signed)
PROGRESS NOTE    Taylor Willis  YPP:[id number] DOB: 1940-12-16 DOA: 04/04/2020 PCP: Deloria Lair., MD   Chief complaint: Acute respiratory failure with hypoxia in the setting of pulmonary edema and vascular congestion.  Brief admission Narrative:  Taylor Willis is a 80 y.o. male with medical history significant of prostate cancer, history of asthma clinically disease stage V (close to require hemodialysis as deformity reports), hyperlipidemia, hypertension and gout; who presented to the hospital secondary to shortness of breath, hypoxia, orthopnea and decreasing urine output.  Patient actively Followed by Dr. August Albino as an outpatient for his renal failure and looking to start HD on 04/04/20.  Unfortunately since Sunday he has experienced ongoing progressive shortness of breath, dyspnea on exertion and orthopnea.  He presented to [facility] where he was found to have pulmonary edema on chest x-ray, hypoxic in the mid 80s on room air with requirement for BiPAP initiation.  Patient denies fever, chills, hemoptysis, dysuria, hematuria, chest pain, nausea, vomiting, sick contacts or any other complaints.  Covid screening test done at [facility] negative.  His blood work demonstrated metabolic acidosis, BNP more than 35,000 and worsening creatinine/BUN levels.  His hemoglobin decreased to 7.7 from 9's range on previous blood work. No overt bleeding reported.  ED Course: Patient was started on BiPAP and transfer to antipain the hospital stepdown bed for further evaluation, management and dialysis initiation.  Assessment & Plan: 1-acute respiratory failure with hypoxia -In the setting of pulmonary edema and vascular congestion -Continue diuretics as dictated by nephrology  -continue to wean off oxygen as tolerated; checking desat screening; using 2 L nasal cannula supplementation currently. -Planning for next HD tx on 04/08/20 today after tunneled cath placed..  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 -Continue to follow nephrology service recommendation -Continue plan for maximum ultrafiltration -Renal diet and fluid restriction orders in place. -Follow daily weights.  3-gastroesophageal flux disease -Continue PPI  4-essential hypertension -Continue current antihypertensive agents -Blood pressure stable  5-secondary hyperparathyroidism -Continue PhosLo  6-mild hypokalemia -Continue to follow electrolytes trend.   -Replete and adjust K bath with HD as needed.  7-anemia of chronic kidney disease -No signs of overt bleeding -Continue IV iron and Epogen therapy as per nephrology discretion.  8-hx of asthma -No wheezing, no use of accessory muscles, improved bilaterally. -will continue PRN nebulizer bronchodilators.  DVT prophylaxis: Heparin Code Status: Full code Family Communication: Wife at bedside. Disposition:   Status is: Inpatient  Dispo: The patient is from: home              Anticipated d/c is to: home              Patient currently not medically stable for discharge. Continue HD therapy and volume stabilization. Requiring 2L HFNC.  Tunneled hemodialysis catheter placemed today; pernephrology service will performed HD #4 on 04/08/20; seen by PT and recommendations for SNF provided.   Difficult to place patient noe   Consultants:   Nephrology service.   Procedures:  HD See below for x-ray reports.    Antimicrobials:  None    Subjective: Good saturation on RA; no CP, no nausea, no vomiting. Feeling weak and tired.  Objective: Vitals:   04/09/20 0018 04/09/20 0433 04/09/20 0800 04/09/20 1309  BP:  122/66 115/65 129/63  Pulse:  97 79 71  Resp:  20  19  Temp:  98 F (36.7 C)  98.3 F (36.8 C)  TempSrc:  Oral  SpO2: 93% 97%  94%  Weight:      Height:        Intake/Output Summary (Last 24 hours) at 04/09/2020 1621 Last data filed at 04/09/2020 0400 Gross per 24 hour  Intake  589.35 ml  Output 500 ml  Net 89.35 ml   Filed Weights   04/04/20 1850 04/05/20 1000 04/07/20 0400  Weight: 92.2 kg 89.7 kg 88.1 kg    Examination: General exam: Alert, awake. Currently off oxygen with O2 sats 92%.  Feeling weak, tired and deconditioned. Respiratory system: No crackles, no using accessory muscles.  Positive rhonchi. Cardiovascular system: Rate controlled, no rubs, no gallops, no JVD on exam. Gastrointestinal system: Abdomen is nondistended, soft and nontender. No organomegaly or masses felt. Normal bowel sounds heard. Central nervous system: Alert and oriented. No focal neurological deficits. Extremities: No cyanosis or clubbing. Skin: No petechiae Psychiatry: Judgement and insight appear normal. Mood & affect appropriate.    Data Reviewed: I have personally reviewed following labs and imaging studies  CBC: Recent Labs  Lab 04/04/20 1121 04/05/20 0304 04/05/20 0818 04/06/20 1030 04/08/20 0518  WBC 7.8 8.8  --  15.1* 14.9*  NEUTROABS  --   --   --   --  12.3*  HGB 7.3* 7.4* 7.9* 7.6* 8.0*  HCT 22.2* 22.3* 23.7* 24.0* 25.0*  MCV 96.1 95.7  --  98.8 98.4  PLT 184 204  --  188 161    Basic Metabolic Panel: Recent Labs  Lab 04/04/20 1121 04/04/20 1528 04/05/20 0304 04/08/20 0518  NA 128*  --  129* 133*  K 4.1  --  4.0 4.0  CL 90*  --  92* 95*  CO2 21*  --  22 23  GLUCOSE 170*  --  149* 112*  BUN 103*  --  87* 95*  CREATININE 8.69*  --  7.49* 7.70*  CALCIUM 8.4* 8.9 8.7* 8.8*  MG 2.4  --   --   --   PHOS 7.4*  --  7.1* 6.7*    GFR: Estimated Creatinine Clearance: 8.3 mL/min (A) (by C-G formula based on SCr of 7.7 mg/dL (H)).  Liver Function Tests: Recent Labs  Lab 04/04/20 1121 04/05/20 0304 04/08/20 0518  AST 28  --   --   ALT 36  --   --   ALKPHOS 51  --   --   BILITOT 0.9  --   --   PROT 7.0  --   --   ALBUMIN 3.0* 3.3* 3.0*    CBG: No results for input(s): GLUCAP in the last 168 hours.   Recent Results (from the past 240  hour(s))  MRSA PCR Screening     Status: None   Collection Time: 04/04/20 11:52 AM   Specimen: Nasal Mucosa; Nasopharyngeal  Result Value Ref Range Status   MRSA by PCR NEGATIVE NEGATIVE Final    Comment:        The GeneXpert MRSA Assay (FDA approved for NASAL specimens only), is one component of a comprehensive MRSA colonization surveillance program. It is not intended to diagnose MRSA infection nor to guide or monitor treatment for MRSA infections. Performed at [facility], [address] [place], [place] [address]   Surgical pcr screen     Status: None   Collection Time: 04/07/20  3:40 PM   Specimen: Nasal Mucosa; Nasal Swab  Result Value Ref Range Status   MRSA, PCR NEGATIVE NEGATIVE Final   Staphylococcus aureus NEGATIVE NEGATIVE Final  Comment: (NOTE) The Xpert SA Assay (FDA approved for NASAL specimens in patients 50 years of age and older), is one component of a comprehensive surveillance program. It is not intended to diagnose infection nor to guide or monitor treatment. Performed at [facility], [address], [place], [place] [address]      Radiology Studies: [facility] Chest [facility] 1 View  Result Date: 04/08/2020 CLINICAL DATA:  Hemodialysis catheter placement EXAM: PORTABLE CHEST 1 VIEW COMPARISON:  04/07/2020 FINDINGS: Interval placement of right internal jugular approach hemodialysis catheter with distal tip terminating within the superior vena cava. Stable cardiomediastinal contours. Atherosclerotic calcification of the aortic knob. Pulmonary vascular congestion with diffuse interstitial prominence, similar to prior. Small bilateral pleural effusions. No pneumothorax. IMPRESSION: 1. Interval placement of right internal jugular approach dialysis catheter with distal tip terminating within the superior vena cava. No pneumothorax. 2. Persistent findings of pulmonary edema, similar to prior. Electronically Signed   By: Davina Poke D.O.   On: 04/08/2020 10:57   DG C-Arm  1-60 Min-No Report  Result Date: 04/08/2020 Fluoroscopy was utilized by the requesting physician.  No radiographic interpretation.    Scheduled Meds: . calcium acetate  667 mg Oral BID WC  . carvedilol  3.125 mg Oral BID WC  . Chlorhexidine Gluconate Cloth  6 each Topical Q0600  . darbepoetin (ARANESP) injection - DIALYSIS  60 mcg Intravenous Q Fri-HD  . heparin  5,000 Units Subcutaneous Q8H  . sodium chloride flush  3 mL Intravenous Q12H  . vitamin B-12  1,000 mcg Oral Daily   Continuous Infusions: . sodium chloride 250 mL (04/09/20 1211)  . sodium chloride    . sodium chloride    . ferric gluconate (FERRLECIT/NULECIT) IV 125 mg (04/09/20 1212)  . lactated ringers 50 mL/hr at 04/08/20 1347     LOS: 5 days    Time spent: 30 minutes   Barton Dubois, MD [facility]   To contact the attending provider between 7A-7P or the covering provider during after hours 7P-7A, please log into the web site [email or web address] and access using universal [place] password for that web site. If you do not have the password, please call the hospital operator.  04/09/2020, 4:21 PM

## 2020-04-09 NOTE — Progress Notes (Signed)
SATURATION QUALIFICATIONS: (This note is used to comply with regulatory documentation for home oxygen)  Patient Saturations on Room Air at Rest = 94%  Patient Saturations on Room Air while Ambulating = 92%  Patient Saturations on 3 Liters of oxygen while Ambulating = Not able to check on 3L due to patient not able to walk but a few steps.  Please briefly explain why patient needs home oxygen: PT attempted ambulating patient. Patient was not able to ambulate only a couple steps. Patient ambulated a couple steps on RA and Saturation was 92%

## 2020-04-10 DIAGNOSIS — N186 End stage renal disease: Secondary | ICD-10-CM

## 2020-04-10 DIAGNOSIS — G9341 Metabolic encephalopathy: Secondary | ICD-10-CM | POA: Diagnosis not present

## 2020-04-10 DIAGNOSIS — J810 Acute pulmonary edema: Secondary | ICD-10-CM | POA: Diagnosis not present

## 2020-04-10 DIAGNOSIS — J9601 Acute respiratory failure with hypoxia: Secondary | ICD-10-CM | POA: Diagnosis not present

## 2020-04-10 DIAGNOSIS — E871 Hypo-osmolality and hyponatremia: Secondary | ICD-10-CM

## 2020-04-10 LAB — CBC
HCT: 27.9 % — ABNORMAL LOW (ref 39.0–52.0)
HCT: 28.8 % — ABNORMAL LOW (ref 39.0–52.0)
Hemoglobin: 8.9 g/dL — ABNORMAL LOW (ref 13.0–17.0)
Hemoglobin: 9.1 g/dL — ABNORMAL LOW (ref 13.0–17.0)
MCH: 31.9 pg (ref 26.0–34.0)
MCH: 31.7 pg (ref 26.0–34.0)
MCHC: 31.9 g/dL (ref 30.0–36.0)
MCHC: 31.6 g/dL (ref 30.0–36.0)
MCV: 100 fL (ref 80.0–100.0)
MCV: 100.3 fL — ABNORMAL HIGH (ref 80.0–100.0)
Platelets: 188 10*3/uL (ref 150–400)
Platelets: 220 10*3/uL (ref 150–400)
RBC: 2.79 MIL/uL — ABNORMAL LOW (ref 4.22–5.81)
RBC: 2.87 MIL/uL — ABNORMAL LOW (ref 4.22–5.81)
RDW: 15.4 % (ref 11.5–15.5)
RDW: 15.9 % — ABNORMAL HIGH (ref 11.5–15.5)
WBC: 12.8 10*3/uL — ABNORMAL HIGH (ref 4.0–10.5)
WBC: 11.6 10*3/uL — ABNORMAL HIGH (ref 4.0–10.5)
nRBC: 0.2 % (ref 0.0–0.2)
nRBC: 0.3 % — ABNORMAL HIGH (ref 0.0–0.2)

## 2020-04-10 LAB — URINALYSIS, COMPLETE (UACMP) WITH MICROSCOPIC
Bilirubin Urine: NEGATIVE
Glucose, UA: NEGATIVE mg/dL
Ketones, ur: NEGATIVE mg/dL
Leukocytes,Ua: NEGATIVE
Nitrite: NEGATIVE
Protein, ur: 100 mg/dL — AB
Specific Gravity, Urine: 1.011 (ref 1.005–1.030)
pH: 5 (ref 5.0–8.0)

## 2020-04-10 LAB — VITAMIN B12: Vitamin B-12: 1374 pg/mL — ABNORMAL HIGH (ref 180–914)

## 2020-04-10 LAB — RENAL FUNCTION PANEL
Albumin: 2.9 g/dL — ABNORMAL LOW (ref 3.5–5.0)
Anion gap: 14 (ref 5–15)
BUN: 62 mg/dL — ABNORMAL HIGH (ref 8–23)
CO2: 23 mmol/L (ref 22–32)
Calcium: 8.3 mg/dL — ABNORMAL LOW (ref 8.9–10.3)
Chloride: 96 mmol/L — ABNORMAL LOW (ref 98–111)
Creatinine, Ser: 6.41 mg/dL — ABNORMAL HIGH (ref 0.61–1.24)
GFR, Estimated: 8 mL/min — ABNORMAL LOW (ref >60)
Glucose, Bld: 108 mg/dL — ABNORMAL HIGH (ref 70–99)
Phosphorus: 5 mg/dL — ABNORMAL HIGH (ref 2.5–4.6)
Potassium: 3.7 mmol/L (ref 3.5–5.1)
Sodium: 133 mmol/L — ABNORMAL LOW (ref 135–145)

## 2020-04-10 LAB — AMMONIA: Ammonia: 11 umol/L (ref 9–35)

## 2020-04-10 LAB — FOLATE: Folate: 13.7 ng/mL (ref >5.9)

## 2020-04-10 NOTE — Progress Notes (Signed)
Patient actively bleeding from permacath. MD made aware. Told to hold Heparin at this time.

## 2020-04-10 NOTE — Progress Notes (Signed)
PROGRESS NOTE  Taylor Willis NOM:[id number] DOB: 11-15-1940 DOA: 04/04/2020 PCP: Deloria Lair., MD  Brief History:  Taylor Willis a 79 y.o.malewith medical history significant ofprostate cancer, history of asthma clinically disease stage V not on HD,  hyperlipidemia, hypertension and gout;who presented to [facility] secondary to shortness of breath, hypoxia, orthopnea and decreasing urine output.Patient activelyFollowedby Dr. Marlowe Shores an outpatientfor his renal failure and looking to start HD on 04/04/20.Unfortunately since Sunday 03/31/20,  he has experienced ongoing progressive shortness of breath, dyspnea on exertion and orthopnea. He presented to [facility] where he was found to have pulmonary edema on chest x-ray, hypoxic in the mid 80s on room air with requirement for BiPAP initiation. Patient denies fever, chills, hemoptysis, dysuria, hematuria, chest pain, nausea, vomiting, sick contacts or any other complaints. He was subsequently transferred to [facility] to start HD as there was no renal coverage at [facility].  He was noted to be hypoxic and admitted to ICU with BiPAP.  Covid screening test done at [facility] negative.  His blood work demonstrated metabolic acidosis, BNP more than 35,000 and worsening creatinine/BUN levels. His hemoglobin decreased to 7.7 from 9'srange on previous blood work. No overt bleeding reported.  ED Course:Patient was started on BiPAP and transfer to antipain the hospital stepdown bed for further evaluation, management and dialysis initiation.  After transfer from [facility], nephrology was consulted and he has been followed by [facility].  Attempts to access his LUE fistula were unsuccessful.  A temporary dialysis catheter was inserted and he was dialyzed on 3/3, 3/4, and 3/5.  A tunneled HD catheter was subsequently placed in the R-IJ on 04/08/20 by general surgery, Dr. Constance Haw.   Assessment/Plan: acute respiratory failure with  hypoxia -due to pulmonary edema and vascular congestion -initially on diuretics  -initially on bipap -personally reviewed CXR--vascular congestion -now weaned to RA  New ESRD -appreciate nephrology -dialyzed on 3/3, 3/4, and 3/5, 3/7 -Renal diet and fluid restriction orders in place. -Follow daily weights.  Acute metabolic Encephalopathy -multifactorial including ICU/hospital delirium, uremia, hypoxia, electrolyte derangements -given hx from spouse, patient had mild cognitive impairment prior to admission -spouse states patient is not at baseline  -TSH 1.028 -check B12 -check folate -check ammonia -obtain UA  essential hypertension -continue coreg -previously on amlodipine -BP controlled  secondary hyperparathyroidism -Continue PhosLo -intact PTH 133  hypokalemia -Continue to follow electrolytes trend.   -Replete and adjust K bath with HD as needed.  anemia of chronic kidney disease -No signs of overt bleeding -previous Hgb baseline ~11 -Continue IV iron and Epogen therapy as per nephrology   asthma -No wheezing, no use of accessory muscles, improved bilaterally. -continue PRN nebulizer bronchodilators. -20 pack year hx of tobacco -quit 20 years ago      Status is: Inpatient  Remains inpatient appropriate because:Persistent severe electrolyte disturbances   Dispo: The patient is from: Home              Anticipated d/c is to: SNF              Patient currently is not medically stable to d/c.   Difficult to place patient No        Family Communication:   Spouse updated 3/9  Consultants:  renal  Code Status:  FULL   DVT Prophylaxis:  [place] Heparin   Procedures: As Listed in Progress Note Above  Antibiotics: None   Total time spent 35 minutes.  Greater than 50% spent face to face counseling and coordinating care.     Subjective: Patient denies fevers, chills, headache, chest pain, dyspnea, nausea, vomiting, diarrhea, abdominal  pain, dysuria, hematuria, hematochezia, and melena.   Objective: Vitals:   04/09/20 1700 04/09/20 2052 04/10/20 0443 04/10/20 0459  BP:  122/69 (!) 143/83   Pulse:  67 74   Resp:  18 18   Temp:  98.4 F (36.9 C) 97.8 F (36.6 C)   TempSrc:      SpO2: 91% 94% 96% 90%  Weight:      Height:        Intake/Output Summary (Last 24 hours) at 04/10/2020 2505 Last data filed at 04/10/2020 0500 Gross per 24 hour  Intake 120 ml  Output 100 ml  Net 20 ml   Weight change:  Exam:   General:  Pt is alert, follows commands appropriately, not in acute distress  HEENT: No icterus, No thrush, No neck mass, [place]/AT  Cardiovascular: RRR, S1/S2, no rubs, no gallops  Respiratory: bibasilar crackles. No wheeze  Abdomen: Soft/+BS, non tender, non distended, no guarding  Extremities: No edema, No lymphangitis, No petechiae, No rashes, no synovitis   Data Reviewed: I have personally reviewed following labs and imaging studies Basic Metabolic Panel: Recent Labs  Lab 04/04/20 1121 04/04/20 1528 04/05/20 0304 04/08/20 0518 04/10/20 0657  NA 128*  --  129* 133* 133*  K 4.1  --  4.0 4.0 3.7  CL 90*  --  92* 95* 96*  CO2 21*  --  22 23 23   GLUCOSE 170*  --  149* 112* 108*  BUN 103*  --  87* 95* 62*  CREATININE 8.69*  --  7.49* 7.70* 6.41*  CALCIUM 8.4* 8.9 8.7* 8.8* 8.3*  MG 2.4  --   --   --   --   PHOS 7.4*  --  7.1* 6.7* 5.0*   Liver Function Tests: Recent Labs  Lab 04/04/20 1121 04/05/20 0304 04/08/20 0518 04/10/20 0657  AST 28  --   --   --   ALT 36  --   --   --   ALKPHOS 51  --   --   --   BILITOT 0.9  --   --   --   PROT 7.0  --   --   --   ALBUMIN 3.0* 3.3* 3.0* 2.9*   No results for input(s): LIPASE, AMYLASE in the last 168 hours. No results for input(s): AMMONIA in the last 168 hours. Coagulation Profile: No results for input(s): INR, PROTIME in the last 168 hours. CBC: Recent Labs  Lab 04/04/20 1121 04/05/20 0304 04/05/20 0818 04/06/20 1030 04/08/20 0518  04/10/20 0656  WBC 7.8 8.8  --  15.1* 14.9* 12.8*  NEUTROABS  --   --   --   --  12.3*  --   HGB 7.3* 7.4* 7.9* 7.6* 8.0* 8.9*  HCT 22.2* 22.3* 23.7* 24.0* 25.0* 27.9*  MCV 96.1 95.7  --  98.8 98.4 100.0  PLT 184 204  --  188 208 188   Cardiac Enzymes: No results for input(s): CKTOTAL, CKMB, CKMBINDEX, TROPONINI in the last 168 hours. BNP: Invalid input(s): POCBNP CBG: No results for input(s): GLUCAP in the last 168 hours. HbA1C: No results for input(s): HGBA1C in the last 72 hours. Urine analysis: No results found for: COLORURINE, APPEARANCEUR, LABSPEC, PHURINE, GLUCOSEU, HGBUR, BILIRUBINUR, KETONESUR, PROTEINUR, UROBILINOGEN, NITRITE, LEUKOCYTESUR Sepsis Labs: @LABRCNTIP (procalcitonin:4,lacticidven:4) ) Recent Results (from the past 240 hour(s))  MRSA PCR Screening     Status: None   Collection Time: 04/04/20 11:52 AM   Specimen: Nasal Mucosa; Nasopharyngeal  Result Value Ref Range Status   MRSA by PCR NEGATIVE NEGATIVE Final    Comment:        The GeneXpert MRSA Assay (FDA approved for NASAL specimens only), is one component of a comprehensive MRSA colonization surveillance program. It is not intended to diagnose MRSA infection nor to guide or monitor treatment for MRSA infections. Performed at [facility], [address] [place], [place] [address]   Surgical pcr screen     Status: None   Collection Time: 04/07/20  3:40 PM   Specimen: Nasal Mucosa; Nasal Swab  Result Value Ref Range Status   MRSA, PCR NEGATIVE NEGATIVE Final   Staphylococcus aureus NEGATIVE NEGATIVE Final    Comment: (NOTE) The Xpert SA Assay (FDA approved for NASAL specimens in patients 20 years of age and older), is one component of a comprehensive surveillance program. It is not intended to diagnose infection nor to guide or monitor treatment. Performed at [facility], [address], [place], [place] [address]      Scheduled Meds: . calcium acetate  667 mg Oral BID WC  . carvedilol   3.125 mg Oral BID WC  . Chlorhexidine Gluconate Cloth  6 each Topical Q0600  . darbepoetin (ARANESP) injection - DIALYSIS  60 mcg Intravenous Q Fri-HD  . heparin  5,000 Units Subcutaneous Q8H  . sodium chloride flush  3 mL Intravenous Q12H  . vitamin B-12  1,000 mcg Oral Daily   Continuous Infusions: . sodium chloride 250 mL (04/09/20 1211)  . sodium chloride    . sodium chloride    . ferric gluconate (FERRLECIT/NULECIT) IV 125 mg (04/09/20 1212)  . lactated ringers 50 mL/hr at 04/08/20 1347    Procedures/Studies: DG Chest Port 1 View  Result Date: 04/08/2020 CLINICAL DATA:  Hemodialysis catheter placement EXAM: PORTABLE CHEST 1 VIEW COMPARISON:  04/07/2020 FINDINGS: Interval placement of right internal jugular approach hemodialysis catheter with distal tip terminating within the superior vena cava. Stable cardiomediastinal contours. Atherosclerotic calcification of the aortic knob. Pulmonary vascular congestion with diffuse interstitial prominence, similar to prior. Small bilateral pleural effusions. No pneumothorax. IMPRESSION: 1. Interval placement of right internal jugular approach dialysis catheter with distal tip terminating within the superior vena cava. No pneumothorax. 2. Persistent findings of pulmonary edema, similar to prior. Electronically Signed   By: Davina Poke D.O.   On: 04/08/2020 10:57   DG Chest Port 1 View  Result Date: 04/07/2020 CLINICAL DATA:  Pulmonary edema, acute respiratory failure EXAM: PORTABLE CHEST 1 VIEW COMPARISON:  04/04/2020 FINDINGS: Pulmonary insufflation is stable. Diffuse interstitial pulmonary infiltrate persists, improved since prior examination, in keeping with mild to moderate pulmonary edema. Small bilateral pleural effusions are present, left greater than right. Cardiac size is within normal limits. IMPRESSION: Mild to moderate pulmonary edema, improved since prior examination Electronically Signed   By: Fidela Salisbury MD   On: 04/07/2020 06:30    DG C-Arm 1-60 Min-No Report  Result Date: 04/08/2020 Fluoroscopy was utilized by the requesting physician.  No radiographic interpretation.    Orson Eva, DO  [facility]  If 7PM-7AM, please contact night-coverage [email or web address] Password [email or web address] 04/10/2020, 8:22 AM   LOS: 6 days

## 2020-04-10 NOTE — Progress Notes (Signed)
[facility] Progress Note    Assessment/ Plan:   1. ESRD, new; CLIP is to [employer] [facility] 2nd Shift  1. developed resp failure prior to first outpt HD,  2. started HD here 3/3 3. Failed cannulation of AVF 3/3, req R fem TEMP HD cath with Dr. Constance Haw- then done 3 days in row 3/3, 3/4 and 3/5-  Treatment #4 on 3/7.  Next treatment planned 04/11/20, will write orders.    2. Acute hypoxic RF now on [place] but uses no O2 at home-   on RA now 3. Anemia of CKD, Hb 7s.  ESA , Started IV Fe as well.  No hx/o CAD so prob ok not to tranfuse unless < 7- last was at 8 4. CKD-BMD 1. Trend P with HD start; PhosLo 2. PTH 133 5. HTN/Vol: seems to be at EDW I would say 6. Hyponatremia, correcting with HD 7. Confusion-  ICU delerium ?  Uremia ? Better again today  8. Dispo-  As far as dialysis goes, he could be discharged any time to go to OP unit on Thursday.  Getting him more mobile and off O2 will be the issue and to decide if he needs placement or can return home   Subjective:    Feeling well today, wife at bedside, getting his blood drawn.  Says he is feeling much better than previously.   Objective:   BP 95/71   Pulse 71   Temp 97.8 F (36.6 C)   Resp 18   Ht 5\' 11"  (1.803 m)   Wt 88.1 kg   SpO2 96%   BMI 27.09 kg/m   Intake/Output Summary (Last 24 hours) at 04/10/2020 0946 Last data filed at 04/10/2020 0500 Gross per 24 hour  Intake 120 ml  Output 100 ml  Net 20 ml   Weight change:   Physical Exam: Gen: NAD, sitting in bed CVS: RRR Resp: doesn't take a really deep breath but no frank crackles Abd: soft, nontender Ext: trace LE edema ACCESS: R IJ TDC, L AVF very tortuous  Imaging: DG Chest Port 1 View  Result Date: 04/08/2020 CLINICAL DATA:  Hemodialysis catheter placement EXAM: PORTABLE CHEST 1 VIEW COMPARISON:  04/07/2020 FINDINGS: Interval placement of right internal jugular approach hemodialysis catheter with distal tip terminating within the superior vena  cava. Stable cardiomediastinal contours. Atherosclerotic calcification of the aortic knob. Pulmonary vascular congestion with diffuse interstitial prominence, similar to prior. Small bilateral pleural effusions. No pneumothorax. IMPRESSION: 1. Interval placement of right internal jugular approach dialysis catheter with distal tip terminating within the superior vena cava. No pneumothorax. 2. Persistent findings of pulmonary edema, similar to prior. Electronically Signed   By: Davina Poke D.O.   On: 04/08/2020 10:57   DG C-Arm 1-60 Min-No Report  Result Date: 04/08/2020 Fluoroscopy was utilized by the requesting physician.  No radiographic interpretation.    Labs: BMET Recent Labs  Lab 04/04/20 1121 04/04/20 1528 04/05/20 0304 04/08/20 0518 04/10/20 0657  NA 128*  --  129* 133* 133*  K 4.1  --  4.0 4.0 3.7  CL 90*  --  92* 95* 96*  CO2 21*  --  22 23 23   GLUCOSE 170*  --  149* 112* 108*  BUN 103*  --  87* 95* 62*  CREATININE 8.69*  --  7.49* 7.70* 6.41*  CALCIUM 8.4* 8.9 8.7* 8.8* 8.3*  PHOS 7.4*  --  7.1* 6.7* 5.0*   CBC Recent Labs  Lab 04/05/20 0304 04/05/20 0818  04/06/20 1030 04/08/20 0518 04/10/20 0656  WBC 8.8  --  15.1* 14.9* 12.8*  NEUTROABS  --   --   --  12.3*  --   HGB 7.4* 7.9* 7.6* 8.0* 8.9*  HCT 22.3* 23.7* 24.0* 25.0* 27.9*  MCV 95.7  --  98.8 98.4 100.0  PLT 204  --  188 208 188    Medications:    . calcium acetate  667 mg Oral BID WC  . carvedilol  3.125 mg Oral BID WC  . Chlorhexidine Gluconate Cloth  6 each Topical Q0600  . darbepoetin (ARANESP) injection - DIALYSIS  60 mcg Intravenous Q Fri-HD  . heparin  5,000 Units Subcutaneous Q8H  . sodium chloride flush  3 mL Intravenous Q12H  . vitamin B-12  1,000 mcg Oral Daily      Madelon Lips, MD 04/10/2020, 9:46 AM

## 2020-04-10 NOTE — Progress Notes (Incomplete)
Patient's permacath is

## 2020-04-10 NOTE — Progress Notes (Signed)
Physical Therapy Treatment Patient Details Name: Taylor Willis MRN: [id number] DOB: 08-01-1940 Today's Date: 04/10/2020    History of Present Illness Taylor Willis is a 80 y.o. male with medical history significant of prostate cancer, asthma, CKD, HTN and gout with c/o shortness of breath, hypoxia, orthopnea and decreasing urine output.    PT Comments    Patient demonstrates slightly labored movement for sitting up at bedside, very unsteady on feet completing transfers without AD, safer using RW and demonstrates increased endurance/distance for gait training with occasional drifting left/right without loss of balance.  Patient tolerated sitting up in chair after therapy with his spouse present in room.  Patient will benefit from continued physical therapy in hospital and recommended venue below to increase strength, balance, endurance for safe ADLs and gait.    Follow Up Recommendations  SNF;Supervision for mobility/OOB;Supervision - Intermittent     Equipment Recommendations  Rolling walker with 5" wheels;3in1 (PT)    Recommendations for Other Services       Precautions / Restrictions Precautions Precautions: Fall Restrictions Weight Bearing Restrictions: No    Mobility  Bed Mobility Overal bed mobility: Modified Independent             General bed mobility comments: increased time    Transfers Overall transfer level: Needs assistance Equipment used: Rolling walker (2 wheeled);1 person hand held assist;None Transfers: Sit to/from [facility] Sit to Stand: Min assist Stand pivot transfers: Min assist       General transfer comment: unsteady on feet with near loss of balance without AD, safer using RW  Ambulation/Gait Ambulation/Gait assistance: Min guard;Min assist Gait Distance (Feet): 75 Feet Assistive device: Rolling walker (2 wheeled) Gait Pattern/deviations: Decreased step length - left;Decreased stance time - right;Decreased stride  length;Drifts right/left Gait velocity: decreased   General Gait Details: increased endurance/distance with occasional drifting right/left without loss of balance, limited secondary to fatigue and mild SOB with SpO2 at 93% after ambulation   Stairs             Wheelchair Mobility    Modified Rankin (Stroke Patients Only)       Balance Overall balance assessment: Needs assistance Sitting-balance support: Feet supported;No upper extremity supported Sitting balance-Leahy Scale: Good Sitting balance - Comments: seated EOB   Standing balance support: During functional activity;No upper extremity supported Standing balance-Leahy Scale: Poor Standing balance comment: fair using RW                            Cognition Arousal/Alertness: Awake/alert Behavior During Therapy: WFL for tasks assessed/performed Overall Cognitive Status: Within Functional Limits for tasks assessed                                        Exercises General Exercises - Lower Extremity Long Arc Quad: Seated;AROM;Strengthening;Both;10 reps Hip Flexion/Marching: Seated;AROM;Strengthening;Both;10 reps Toe Raises: Seated;AROM;Strengthening;Both;10 reps Heel Raises: Seated;AROM;Strengthening;Both;10 reps    General Comments        Pertinent Vitals/Pain Pain Assessment: No/denies pain    Home Living                      Prior Function            PT Goals (current goals can now be found in the care plan section) Acute Rehab PT Goals Patient Stated Goal: return home after  rehab PT Goal Formulation: With patient/family Time For Goal Achievement: 04/23/20 Potential to Achieve Goals: Good Progress towards PT goals: Progressing toward goals    Frequency    Min 3X/week      PT Plan Current plan remains appropriate    Co-evaluation              AM-PAC PT "6 Clicks" Mobility   Outcome Measure  Help needed turning from your back to your side  while in a flat bed without using bedrails?: None Help needed moving from lying on your back to sitting on the side of a flat bed without using bedrails?: A Little Help needed moving to and from a bed to a chair (including a wheelchair)?: A Little Help needed standing up from a chair using your arms (e.g., wheelchair or bedside chair)?: A Little Help needed to walk in hospital room?: A Little Help needed climbing 3-5 steps with a railing? : A Lot 6 Click Score: 18    End of Session   Activity Tolerance: Patient tolerated treatment well;Patient limited by fatigue Patient left: in chair;with call bell/phone within reach;with family/visitor present Nurse Communication: Mobility status PT Visit Diagnosis: Unsteadiness on feet (R26.81);Other abnormalities of gait and mobility (R26.89);Muscle weakness (generalized) (M62.81)     Time: 4076-8088 PT Time Calculation (min) (ACUTE ONLY): 21 min  Charges:  $Gait Training: 8-22 mins $Therapeutic Exercise: 8-22 mins                     11:15 AM, 04/10/20 Lonell Grandchild, MPT Physical Therapist with [facility] [phone] office [phone] mobile phone

## 2020-04-10 NOTE — Progress Notes (Signed)
Removed old dressing. Heavily soiled and multiple clots removed. Cleaned area using sterile technique. Quikclot and gauze applied. Site covered with Tegaderm. Patient tolerated well. No excessive bleeding during the dressing change. Will continue to monitor.

## 2020-04-10 NOTE — TOC Progression Note (Addendum)
Transition of Care [facility]) - Progression Note    Patient Details  Name: Taylor Willis MRN: [id number] Date of Birth: 22-Feb-1940  Transition of Care [facility]) CM/SW Contact  Salome Arnt, [place] Phone Number: 04/10/2020, 8:38 AM  Clinical Narrative:  LCSW provided bed offers to pt's wife who chooses [facility]. Facility notified. Pt's wife aware she will need to provide pt's vaccination card in order for her to visit. Mardene Celeste at [facility] given pt's dialysis schedule (TTS 11:45 at [facility]). Pt will require COVID test prior to d/c. TOC will continue to follow.      Expected Discharge Plan: Home/Self Care Barriers to Discharge: Continued Medical Work up  Expected Discharge Plan and Services Expected Discharge Plan: Home/Self Care In-house Referral: Clinical Social Work   Post Acute Care Choice: Dialysis Living arrangements for the past 2 months: Single Family Home                                       Social Determinants of Health (SDOH) Interventions    Readmission Risk Interventions No flowsheet data found.

## 2020-04-10 NOTE — Progress Notes (Signed)
Bipap order is PRN.  Not needed at this time. 

## 2020-04-10 NOTE — Progress Notes (Signed)
9:03 PM Nurse called due to patient bleeding from the tunneled HD Catheter. At bedside, the dressing was soaked with blood, patient was in no acute distress and had no complaint. CBC was done to ensure no drop in H/H, this was 9.1/28.8 (increased from 8.9/27.9). No indication for tranfusion at this time. The dressing was changed, Quikclot applied. Consider surgical consult if bleeding continues in the morning. We shall continue to monitor H/H.

## 2020-04-11 DIAGNOSIS — N186 End stage renal disease: Secondary | ICD-10-CM | POA: Diagnosis not present

## 2020-04-11 DIAGNOSIS — J9601 Acute respiratory failure with hypoxia: Secondary | ICD-10-CM | POA: Diagnosis not present

## 2020-04-11 DIAGNOSIS — G9341 Metabolic encephalopathy: Secondary | ICD-10-CM | POA: Diagnosis not present

## 2020-04-11 DIAGNOSIS — E871 Hypo-osmolality and hyponatremia: Secondary | ICD-10-CM

## 2020-04-11 LAB — RESP PANEL BY RT-PCR (FLU A&B, COVID) ARPGX2
Influenza A by PCR: NEGATIVE
Influenza B by PCR: NEGATIVE
SARS Coronavirus 2 by RT PCR: NEGATIVE

## 2020-04-11 NOTE — Progress Notes (Signed)
  [facility] Progress Note    Assessment/ Plan:   1. ESRD, new; CLIP is to [employer] 2nd Shift  1. developed resp failure prior to first outpt HD,  2. started HD here 3/3 3. Failed cannulation of AVF 3/3, req R fem TEMP HD cath with Dr. Constance Haw- then done 3 days in row 3/3, 3/4 and 3/5-  Treatment #4 on 3/7.  Next treatment planned today 04/11/20, no heparin   2. Acute hypoxic RF now on [place] but uses no O2 at home-   on RA now 3. Anemia of CKD, Hb 7s.  ESA , Started IV Fe as well.  No hx/o CAD so prob ok not to tranfuse unless < 7- last was at 8 4. CKD-BMD 1. Trend P with HD start; PhosLo 2. PTH 133 5. HTN/Vol: nearing EDW 6. Hyponatremia, correcting with HD 7. Confusion-  seems to be getting better 8. Dispo-  ok from renal perspective to be discharged after dialysis.     Subjective:    Seen in room, wife at bedside.  TDC had some bleeding overnight, dressing changed multiple times, surgery to look at per pt and wife.  For dialysis and d/c today.     Objective:   BP 133/71   Pulse 72   Temp 98 F (36.7 C)   Resp 18   Ht 5\' 11"  (1.803 m)   Wt 88.1 kg   SpO2 92%   BMI 27.09 kg/m   Intake/Output Summary (Last 24 hours) at 04/11/2020 0930 Last data filed at 04/11/2020 0500 Gross per 24 hour  Intake 360 ml  Output 1100 ml  Net -740 ml   Weight change:   Physical Exam: Gen: NAD, sitting in bed CVS: RRR Resp: doesn't take a really deep breath but no frank crackles Abd: soft, nontender Ext: trace LE edema ACCESS: R IJ TDC, dressing saturated with blood, L AVF very tortuous  Imaging: No results found.  Labs: BMET Recent Labs  Lab 04/04/20 1121 04/04/20 1528 04/05/20 0304 04/08/20 0518 04/10/20 0657  NA 128*  --  129* 133* 133*  K 4.1  --  4.0 4.0 3.7  CL 90*  --  92* 95* 96*  CO2 21*  --  22 23 23   GLUCOSE 170*  --  149* 112* 108*  BUN 103*  --  87* 95* 62*  CREATININE 8.69*  --  7.49* 7.70* 6.41*  CALCIUM 8.4* 8.9 8.7* 8.8* 8.3*   PHOS 7.4*  --  7.1* 6.7* 5.0*   CBC Recent Labs  Lab 04/06/20 1030 04/08/20 0518 04/10/20 0656 04/10/20 2128  WBC 15.1* 14.9* 12.8* 11.6*  NEUTROABS  --  12.3*  --   --   HGB 7.6* 8.0* 8.9* 9.1*  HCT 24.0* 25.0* 27.9* 28.8*  MCV 98.8 98.4 100.0 100.3*  PLT 188 208 188 220    Medications:    . calcium acetate  667 mg Oral BID WC  . carvedilol  3.125 mg Oral BID WC  . Chlorhexidine Gluconate Cloth  6 each Topical Q0600  . darbepoetin (ARANESP) injection - DIALYSIS  60 mcg Intravenous Q Fri-HD  . heparin  5,000 Units Subcutaneous Q8H  . sodium chloride flush  3 mL Intravenous Q12H  . vitamin B-12  1,000 mcg Oral Daily      Madelon Lips, MD 04/11/2020, 9:30 AM

## 2020-04-11 NOTE — Progress Notes (Signed)
Patient new dressing soiled with blood. MD made aware.

## 2020-04-11 NOTE — Procedures (Signed)
   HEMODIALYSIS TREATMENT NOTE:  3.5 hour heparin-free HD completed via RIJ TDC.  Primary nurse reports having held direct pressure of tunnel of catheter earlier this morning.  Dressing was changed pre-HD and NO active bleeding noted.  Dressing remained dry and intact for the duration of the HD session.  Goal met: 1 liter removed without interruption in UF.  All blood was returned.   , RN 

## 2020-04-11 NOTE — Progress Notes (Signed)
Nsg Discharge Note  Admit Date:  04/04/2020 Discharge date: 04/11/2020   Remo Lipps to be D/C'd Skilled nursing facility per MD order.  AVS completed.  Copy for chart, and copy for patient signed, and dated. Patient/caregiver able to verbalize understanding.  Discharge Medication: Allergies as of 04/11/2020      Reactions   Aspirin Other (See Comments)   GI irritation   Hydrochlorothiazide    Dizziness and unable to walk   Ibuprofen Other (See Comments)   GI irritation      Medication List    STOP taking these medications   acetaminophen 500 MG tablet Commonly known as: TYLENOL   allopurinol 100 MG tablet Commonly known as: ZYLOPRIM   amLODipine 10 MG tablet Commonly known as: NORVASC   amoxicillin-clavulanate 500-125 MG tablet Commonly known as: AUGMENTIN   azithromycin 250 MG tablet Commonly known as: ZITHROMAX   calcitRIOL 0.25 MCG capsule Commonly known as: ROCALTROL   Cranberry 250 MG Caps   furosemide 40 MG tablet Commonly known as: LASIX   oxyCODONE-acetaminophen 5-325 MG tablet Commonly known as: Percocet   sodium bicarbonate 650 MG tablet     TAKE these medications   albuterol 108 (90 Base) MCG/ACT inhaler Commonly known as: VENTOLIN HFA Inhale 2 puffs into the lungs every 6 (six) hours as needed for wheezing.   calcium acetate 667 MG capsule Commonly known as: PHOSLO Take 667 mg by mouth 2 (two) times daily with a meal.   carvedilol 3.125 MG tablet Commonly known as: COREG Take 3.125 mg by mouth 2 (two) times daily with a meal.   Cholecalciferol 25 MCG (1000 UT) tablet Take 1,000 Units by mouth daily.   Cinnamon 500 MG capsule Take 500 mg by mouth daily.   FeroSul 325 (65 FE) MG tablet Generic drug: ferrous sulfate Take 325 mg by mouth in the morning, at noon, and at bedtime.   vitamin B-12 1000 MCG tablet Commonly known as: CYANOCOBALAMIN Take 1,000 mcg by mouth daily.       Discharge Assessment: Vitals:   04/11/20 1420  04/11/20 1518  BP: 130/68   Pulse: 70   Resp: 16   Temp: 98 F (36.7 C)   SpO2:  96%   Skin clean, dry and intact without evidence of skin break down, no evidence of skin tears noted. IV catheter discontinued intact. Site without signs and symptoms of complications - no redness or edema noted at insertion site, patient denies c/o pain - only slight tenderness at site.  Dressing with slight pressure applied.  D/c Instructions-Education: Discharge instructions given to patient/family with verbalized understanding. D/c education completed with patient/family including follow up instructions, medication list, d/c activities limitations if indicated, with other d/c instructions as indicated by MD - patient able to verbalize understanding, all questions fully answered. Patient instructed to return to ED, call 911, or call MD for any changes in condition.  Patient escorted via [place], and D/C home via private auto.  Dorcas Mcmurray, LPN 05/04/270 5:36 PM

## 2020-04-11 NOTE — TOC Transition Note (Signed)
Transition of Care [facility]) - CM/SW Discharge Note   Patient Details  Name: Taylor Willis MRN: [id number] Date of Birth: 1941/01/07  Transition of Care [facility]) CM/SW Contact:  Shade Flood, LCSW Phone Number: 04/11/2020, 12:53 PM   Clinical Narrative:     Plan is for dc to [facility] after HD today. Pt's wife in the room and aware. Mardene Celeste at [facility] aware and expecting pt today. DC clinical sent electronically. RN to call report to [phone].   Pt will transport with EMS when ready.  There are no other TOC needs for dc.  Final next level of care: Skilled Nursing Facility Barriers to Discharge: Barriers Resolved   Patient Goals and CMS Choice   CMS Medicare.gov Compare Post Acute Care list provided to:: Patient Choice offered to / list presented to : Patient  Discharge Placement              Patient chooses bed at: Other - please specify in the comment section below: [facility]) Patient to be transferred to facility by: EMS Name of family member notified: Horris Latino Patient and family notified of of transfer: 04/11/20  Discharge Plan and Services In-house Referral: Clinical Social Work   Post Acute Care Choice: Dialysis                               Social Determinants of Health (SDOH) Interventions     Readmission Risk Interventions No flowsheet data found.

## 2020-04-11 NOTE — Discharge Summary (Addendum)
Physician Discharge Summary  Taylor Willis FAO:[id number] DOB: 1940-11-16 DOA: 04/04/2020  PCP: Deloria Lair., MD  Admit date: 04/04/2020 Discharge date: 04/11/2020  Admitted From: Home Disposition:  SNF  Recommendations for Outpatient Follow-up:  1. Follow up with PCP in 1-2 weeks 2. 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 Marlowe Shores an outpatientfor his renal failure and looking to start HD on 04/04/20.Unfortunately since Sunday 03/31/20,  he has experienced ongoing progressive shortness of breath, dyspnea on exertion and orthopnea. He presented to [facility] where he was found to have pulmonary edema on chest x-ray, hypoxic in the mid 80s on room air with requirement for BiPAP initiation. Patient denies fever, chills, hemoptysis, dysuria, hematuria, chest pain, nausea, vomiting, sick contacts or any other complaints. He was subsequently transferred to [facility] to start HD as there was no renal coverage at [facility].  He was noted to be hypoxic and admitted to ICU with BiPAP.  Covid screening test done at [facility] negative.  His blood work demonstrated metabolic acidosis, BNP more than 35,000 and worsening creatinine/BUN levels. His hemoglobin decreased to 7.7 from 9'srange on previous blood work. No overt bleeding reported.  ED Course:Patient was started on BiPAP and transfer to antipain the hospital stepdown bed for further evaluation, management and dialysis initiation.  After transfer from [facility], nephrology was consulted and he has been followed by  [facility].  Attempts to access his LUE fistula were unsuccessful.  A temporary dialysis catheter was inserted and he was dialyzed on 3/3, 3/4, and 3/5.  A tunneled HD catheter was subsequently placed in the R-IJ on 04/08/20 by general surgery, Dr. Constance Haw.  Discharge Diagnoses:  acute respiratory failure with hypoxia -due to pulmonary edema and vascular congestion -initially on diuretics  -initially on bipap -personally reviewed CXR--vascular congestion -now weaned to RA after initiation of HD  New ESRD -appreciate nephrology -dialyzed on 3/3, 3/4, and 3/5, 3/7 -Renal diet and fluid restriction orders in place. -Follow daily weights. -last HD on 6/96/29  Acute metabolic Encephalopathy -multifactorial including ICU/hospital delirium, uremia, hypoxia, electrolyte derangements -given hx from spouse, patient had mild cognitive impairment prior to admission -spouse states patient gradually improving -TSH 1.028 -check B12--1374 -check folate--13.7 -check ammonia--11 -obtain UA--no pyuria  essential hypertension -continue coreg -previously on amlodipine--do not plan to restart -BP controlled -anticipate continued improvement with HD  secondary hyperparathyroidism -Continue PhosLo -intact PTH 133  hypokalemia -Continue to follow electrolytes trend.  -Replete and adjust K bath with HD as needed.  anemia of chronic kidney disease -had mild oozing from HD catheter site after placement -previous Hgb baseline ~11 -Continue IV iron and Epogen therapy as per nephrology   asthma -No wheezing, no use of accessory muscles, improved bilaterally. -continue PRN nebulizer bronchodilators. -20 pack year hx of tobacco -quit 20 years ago -stable on RA   Discharge Instructions   Allergies as of 04/11/2020      Reactions   Aspirin Other (See Comments)   GI irritation   Hydrochlorothiazide    Dizziness and unable to walk   Ibuprofen Other (See Comments)   GI  irritation      Medication List  STOP taking these medications   acetaminophen 500 MG tablet Commonly known as: TYLENOL   allopurinol 100 MG tablet Commonly known as: ZYLOPRIM   amLODipine 10 MG tablet Commonly known as: NORVASC   amoxicillin-clavulanate 500-125 MG tablet Commonly known as: AUGMENTIN   azithromycin 250 MG tablet Commonly known as: ZITHROMAX   calcitRIOL 0.25 MCG capsule Commonly known as: ROCALTROL   Cranberry 250 MG Caps   furosemide 40 MG tablet Commonly known as: LASIX   oxyCODONE-acetaminophen 5-325 MG tablet Commonly known as: Percocet   sodium bicarbonate 650 MG tablet     TAKE these medications   albuterol 108 (90 Base) MCG/ACT inhaler Commonly known as: VENTOLIN HFA Inhale 2 puffs into the lungs every 6 (six) hours as needed for wheezing.   calcium acetate 667 MG capsule Commonly known as: PHOSLO Take 667 mg by mouth 2 (two) times daily with a meal.   carvedilol 3.125 MG tablet Commonly known as: COREG Take 3.125 mg by mouth 2 (two) times daily with a meal.   Cholecalciferol 25 MCG (1000 UT) tablet Take 1,000 Units by mouth daily.   Cinnamon 500 MG capsule Take 500 mg by mouth daily.   FeroSul 325 (65 FE) MG tablet Generic drug: ferrous sulfate Take 325 mg by mouth in the morning, at noon, and at bedtime.   vitamin B-12 1000 MCG tablet Commonly known as: CYANOCOBALAMIN Take 1,000 mcg by mouth daily.       Contact information for after-discharge care    Destination    [facility] .   Service: Skilled Nursing Contact information: [address] [place] [phone]                 Allergies  Allergen Reactions  . Aspirin Other (See Comments)    GI irritation   . Hydrochlorothiazide     Dizziness and unable to walk  . Ibuprofen Other (See Comments)    GI irritation      Consultations:  renal   Procedures/Studies: DG Chest Port 1 View  Result Date: 04/08/2020 CLINICAL DATA:  Hemodialysis catheter placement EXAM: PORTABLE CHEST 1 VIEW COMPARISON:  04/07/2020 FINDINGS: Interval placement of right internal jugular approach hemodialysis catheter with distal tip terminating within the superior vena cava. Stable cardiomediastinal contours. Atherosclerotic calcification of the aortic knob. Pulmonary vascular congestion with diffuse interstitial prominence, similar to prior. Small bilateral pleural effusions. No pneumothorax. IMPRESSION: 1. Interval placement of right internal jugular approach dialysis catheter with distal tip terminating within the superior vena cava. No pneumothorax. 2. Persistent findings of pulmonary edema, similar to prior. Electronically Signed   By: Davina Poke D.O.   On: 04/08/2020 10:57   DG Chest Port 1 View  Result Date: 04/07/2020 CLINICAL DATA:  Pulmonary edema, acute respiratory failure EXAM: PORTABLE CHEST 1 VIEW COMPARISON:  04/04/2020 FINDINGS: Pulmonary insufflation is stable. Diffuse interstitial pulmonary infiltrate persists, improved since prior examination, in keeping with mild to moderate pulmonary edema. Small bilateral pleural effusions are present, left greater than right. Cardiac size is within normal limits. IMPRESSION: Mild to moderate pulmonary edema, improved since prior examination Electronically Signed   By: Fidela Salisbury MD   On: 04/07/2020 06:30   DG C-Arm 1-60 Min-No Report  Result Date: 04/08/2020 Fluoroscopy was utilized by the requesting physician.  No radiographic interpretation.        Discharge Exam: Vitals:   04/11/20 0442 04/11/20 0615  BP: 133/71   Pulse: 72  Resp: 18   Temp: 98 F (36.7 C)   SpO2: 94% 92%   Vitals:   04/10/20 1744 04/10/20 2224 04/11/20 0442 04/11/20 0615  BP:  130/69 133/71   Pulse:  64 72   Resp:  18 18   Temp:  97.8 F (36.6 C) 98 F (36.7 C)   TempSrc:       SpO2: 99% 97% 94% 92%  Weight:      Height:        General: Pt is alert, awake, not in acute distress Cardiovascular: RRR, S1/S2 +, no rubs, no gallops Respiratory: fine bibasilar rales. No wheeze Abdominal: Soft, NT, ND, bowel sounds + Extremities: no edema, no cyanosis Neuro:  CN II-XII intact, strength 4/5 in RUE, RLE, strength 4/5 LUE, LLE; sensation intact bilateral; no dysmetria; babinski equivocal    The results of significant diagnostics from this hospitalization (including imaging, microbiology, ancillary and laboratory) are listed below for reference.    Significant Diagnostic Studies: DG Chest Port 1 View  Result Date: 04/08/2020 CLINICAL DATA:  Hemodialysis catheter placement EXAM: PORTABLE CHEST 1 VIEW COMPARISON:  04/07/2020 FINDINGS: Interval placement of right internal jugular approach hemodialysis catheter with distal tip terminating within the superior vena cava. Stable cardiomediastinal contours. Atherosclerotic calcification of the aortic knob. Pulmonary vascular congestion with diffuse interstitial prominence, similar to prior. Small bilateral pleural effusions. No pneumothorax. IMPRESSION: 1. Interval placement of right internal jugular approach dialysis catheter with distal tip terminating within the superior vena cava. No pneumothorax. 2. Persistent findings of pulmonary edema, similar to prior. Electronically Signed   By: Davina Poke D.O.   On: 04/08/2020 10:57   DG Chest Port 1 View  Result Date: 04/07/2020 CLINICAL DATA:  Pulmonary edema, acute respiratory failure EXAM: PORTABLE CHEST 1 VIEW COMPARISON:  04/04/2020 FINDINGS: Pulmonary insufflation is stable. Diffuse interstitial pulmonary infiltrate persists, improved since prior examination, in keeping with mild to moderate pulmonary edema. Small bilateral pleural effusions are present, left greater than right. Cardiac size is within normal limits. IMPRESSION: Mild to moderate pulmonary edema, improved since  prior examination Electronically Signed   By: Fidela Salisbury MD   On: 04/07/2020 06:30   DG C-Arm 1-60 Min-No Report  Result Date: 04/08/2020 Fluoroscopy was utilized by the requesting physician.  No radiographic interpretation.     Microbiology: Recent Results (from the past 240 hour(s))  MRSA PCR Screening     Status: None   Collection Time: 04/04/20 11:52 AM   Specimen: Nasal Mucosa; Nasopharyngeal  Result Value Ref Range Status   MRSA by PCR NEGATIVE NEGATIVE Final    Comment:        The GeneXpert MRSA Assay (FDA approved for NASAL specimens only), is one component of a comprehensive MRSA colonization surveillance program. It is not intended to diagnose MRSA infection nor to guide or monitor treatment for MRSA infections. Performed at [facility], [address], [place], [place] [address]   Surgical pcr screen     Status: None   Collection Time: 04/07/20  3:40 PM   Specimen: Nasal Mucosa; Nasal Swab  Result Value Ref Range Status   MRSA, PCR NEGATIVE NEGATIVE Final   Staphylococcus aureus NEGATIVE NEGATIVE Final    Comment: (NOTE) The Xpert SA Assay (FDA approved for NASAL specimens in patients 32 years of age and older), is one component of a comprehensive surveillance program. It is not intended to diagnose infection nor to guide or monitor treatment. Performed at [facility], [address] [place], [place]  [address]      Labs: Basic Metabolic Panel: Recent Labs  Lab 04/04/20 1121 04/04/20 1528 04/05/20 0304 04/08/20 0518 04/10/20 0657  NA 128*  --  129* 133* 133*  K 4.1  --  4.0 4.0 3.7  CL 90*  --  92* 95* 96*  CO2 21*  --  22 23 23   GLUCOSE 170*  --  149* 112* 108*  BUN 103*  --  87* 95* 62*  CREATININE 8.69*  --  7.49* 7.70* 6.41*  CALCIUM 8.4* 8.9 8.7* 8.8* 8.3*  MG 2.4  --   --   --   --   PHOS 7.4*  --  7.1* 6.7* 5.0*   Liver Function Tests: Recent Labs  Lab 04/04/20 1121 04/05/20 0304 04/08/20 0518 04/10/20 0657  AST 28  --   --    --   ALT 36  --   --   --   ALKPHOS 51  --   --   --   BILITOT 0.9  --   --   --   PROT 7.0  --   --   --   ALBUMIN 3.0* 3.3* 3.0* 2.9*   No results for input(s): LIPASE, AMYLASE in the last 168 hours. Recent Labs  Lab 04/10/20 0934  AMMONIA 11   CBC: Recent Labs  Lab 04/05/20 0304 04/05/20 0818 04/06/20 1030 04/08/20 0518 04/10/20 0656 04/10/20 2128  WBC 8.8  --  15.1* 14.9* 12.8* 11.6*  NEUTROABS  --   --   --  12.3*  --   --   HGB 7.4* 7.9* 7.6* 8.0* 8.9* 9.1*  HCT 22.3* 23.7* 24.0* 25.0* 27.9* 28.8*  MCV 95.7  --  98.8 98.4 100.0 100.3*  PLT 204  --  188 208 188 220   Cardiac Enzymes: No results for input(s): CKTOTAL, CKMB, CKMBINDEX, TROPONINI in the last 168 hours. BNP: Invalid input(s): POCBNP CBG: No results for input(s): GLUCAP in the last 168 hours.  Time coordinating discharge:  36 minutes  Signed:  Orson Eva, DO [facility] Pager: [phone] 04/11/2020, 9:06 AM

## 2020-04-11 NOTE — Progress Notes (Signed)
Pressure applied  to HD  right internal catheter placement site, per Dr Tat orders uninterrupted for 30 minutes Patient did well tolerated pressure.Blood noted to gauze, will continue to monitor patient. Family at bedside.

## 2020-04-11 NOTE — Care Management Important Message (Signed)
Important Message  Patient Details  Name: Taylor Willis MRN: [id number] Date of Birth: Sep 06, 1940   Medicare Important Message Given:  Yes     Tommy Medal 04/11/2020, 11:06 AM

## 2020-04-13 LAB — URINE CULTURE: Culture: 40000 — AB

## 2020-04-23 ENCOUNTER — Other Ambulatory Visit: Payer: Self-pay

## 2020-04-23 DIAGNOSIS — N186 End stage renal disease: Secondary | ICD-10-CM

## 2020-04-23 DIAGNOSIS — Z6831 Body mass index (BMI) 31.0-31.9, adult: Secondary | ICD-10-CM | POA: Insufficient documentation

## 2020-04-24 ENCOUNTER — Other Ambulatory Visit: Payer: Self-pay

## 2020-04-24 ENCOUNTER — Ambulatory Visit (INDEPENDENT_AMBULATORY_CARE_PROVIDER_SITE_OTHER): Payer: Medicare Other | Admitting: Physician Assistant

## 2020-04-24 ENCOUNTER — Ambulatory Visit (HOSPITAL_COMMUNITY)
Admission: RE | Admit: 2020-04-24 | Discharge: 2020-04-24 | Disposition: A | Discharge status: Home / Self Care | Payer: No Typology Code available for payment source | Source: Ambulatory Visit | Attending: Vascular Surgery | Admitting: Vascular Surgery

## 2020-04-24 VITALS — BP 176/93 | HR 57 | Temp 97.7°F | Resp 20 | Ht 71.0 in | Wt 208.0 lb

## 2020-04-24 DIAGNOSIS — N186 End stage renal disease: Secondary | ICD-10-CM | POA: Insufficient documentation

## 2020-04-24 MED ORDER — SODIUM CHLORIDE 0.9% FLUSH: 3.0000 mL | Freq: Two times a day (BID) | INTRAVENOUS | Status: DC

## 2020-04-24 MED ORDER — SODIUM CHLORIDE 0.9 % IV SOLN: 250.0000 mL | INTRAVENOUS | Status: DC | PRN

## 2020-04-24 MED ORDER — SODIUM CHLORIDE 0.9% FLUSH: 3.0000 mL | INTRAVENOUS | Status: DC | PRN

## 2020-04-24 NOTE — H&P (View-Only) (Signed)
Established Dialysis Access   History of Present Illness   Taylor Willis is a 80 y.o. (04-25-1940) male who presents for re-evaluation of permanent access.  He has a left brachiocephalic fistula that was created in January 2021.  He underwent sidebranch ligation by Dr. Oneida Alar on 09/12/2019.  Most recently he had a right IJ TDC placed by Dr. Constance Haw at [facility] on 04/08/2020 to initiate hemodialysis.  He was referred back to our clinic due to trouble with cannulation.  Per patient there was 1 attempt made to cannulate left arm fistula which was unsuccessful.  He denies any bleeding or evidence of infiltration.  He was also told at his kidney center that the fistula is too tortuous.  He is dialyzing on a Tuesday Thursday Saturday schedule at [facility] in [place].  Current Outpatient Medications  Medication Sig Dispense Refill  . albuterol (PROVENTIL HFA;VENTOLIN HFA) 108 (90 BASE) MCG/ACT inhaler Inhale 2 puffs into the lungs every 6 (six) hours as needed for wheezing.    . calcium acetate (PHOSLO) 667 MG capsule Take 667 mg by mouth 2 (two) times daily with a meal.    . carvedilol (COREG) 3.125 MG tablet Take 3.125 mg by mouth 2 (two) times daily with a meal.    . Cholecalciferol 25 MCG (1000 UT) tablet Take 1,000 Units by mouth daily.     . Cinnamon 500 MG capsule Take 500 mg by mouth daily.    . FEROSUL 325 (65 Fe) MG tablet Take 325 mg by mouth in the morning, at noon, and at bedtime.    . vitamin B-12 (CYANOCOBALAMIN) 1000 MCG tablet Take 1,000 mcg by mouth daily.     No current facility-administered medications for this visit.    REVIEW OF SYSTEMS (negative unless checked):   Cardiac:  []  Chest pain or chest pressure? []  Shortness of breath upon activity? []  Shortness of breath when lying flat? []  Irregular heart rhythm?  Vascular:  []  Pain in calf, thigh, or hip brought on by walking? []  Pain in feet at night that wakes you up from your sleep? []  Blood clot in your  veins? []  Leg swelling?  Pulmonary:  []  Oxygen at home? []  Productive cough? []  Wheezing?  Neurologic:  []  Sudden weakness in arms or legs? []  Sudden numbness in arms or legs? []  Sudden onset of difficult speaking or slurred speech? []  Temporary loss of vision in one eye? []  Problems with dizziness?  Gastrointestinal:  []  Blood in stool? []  Vomited blood?  Genitourinary:  []  Burning when urinating? []  Blood in urine?  Psychiatric:  []  Major depression  Hematologic:  []  Bleeding problems? []  Problems with blood clotting?  Dermatologic:  []  Rashes or ulcers?  Constitutional:  []  Fever or chills?  Ear/Nose/Throat:  []  Change in hearing? []  Nose bleeds? []  Sore throat?  Musculoskeletal:  []  Back pain? []  Joint pain? []  Muscle pain?   Physical Examination   Vitals:   04/24/20 1508  BP: (!) 176/93  Pulse: (!) 57  Resp: 20  Temp: 97.7 F (36.5 C)  TempSrc: Temporal  SpO2: 100%  Weight: 208 lb (94.3 kg)  Height: 5\' 11"  (1.803 m)   Body mass index is 29.01 kg/m.  General:  WDWN in NAD; vital signs documented above Gait: Not observed HENT: WNL, normocephalic Pulmonary: normal non-labored breathing , without Rales, rhonchi,  wheezing Cardiac: regular HR Abdomen: soft, NT, no masses Skin: without rashes Extremities: Palpable left radial pulse; flow through left brachiocephalic  fistula is pulsatile; fistula takes a tortuous path in the upper arm Musculoskeletal: no muscle wasting or atrophy  Neurologic: A&O X 3;  No focal weakness or paresthesias are detected Psychiatric:  The pt has Normal affect.   Non-invasive Vascular Imaging    Fistula duplex does not demonstrate any hemodynamically significant stenosis    Medical Decision Making   Taylor Willis is a 80 y.o. male who presents with ESRD requiring hemodialysis.  He was referred back to clinic due to trouble with cannulation of left brachiocephalic fistula   Patent left brachiocephalic  fistula however flow is pulsatile on exam  Plan will be for left arm fistulogram with possible intervention on a nondialysis day in the near future  Pending fistulogram results he may require further revision; if fistula revision is needed this can be performed by Dr. Donnetta Hutching at any [place], benefits, and alternatives to access surgery were discussed.   The patient is aware the risks include but are not limited to: bleeding, infection, steal syndrome, nerve damage, thrombosis, failure to mature, and need for additional procedures.   The patient agrees to proceed with the procedure.    Dagoberto Ligas PA-C [facility] Office: [phone]  Clinic MD: Scot Dock

## 2020-04-24 NOTE — Progress Notes (Signed)
Established Dialysis Access   History of Present Illness   Taylor Willis is a 80 y.o. (24-Mar-1940) male who presents for re-evaluation of permanent access.  He has a left brachiocephalic fistula that was created in January 2021.  He underwent sidebranch ligation by Dr. Oneida Alar on 09/12/2019.  Most recently he had a right IJ TDC placed by Dr. Constance Haw at [facility] on 04/08/2020 to initiate hemodialysis.  He was referred back to our clinic due to trouble with cannulation.  Per patient there was 1 attempt made to cannulate left arm fistula which was unsuccessful.  He denies any bleeding or evidence of infiltration.  He was also told at his kidney center that the fistula is too tortuous.  He is dialyzing on a Tuesday Thursday Saturday schedule at [facility] in [place].  Current Outpatient Medications  Medication Sig Dispense Refill  . albuterol (PROVENTIL HFA;VENTOLIN HFA) 108 (90 BASE) MCG/ACT inhaler Inhale 2 puffs into the lungs every 6 (six) hours as needed for wheezing.    . calcium acetate (PHOSLO) 667 MG capsule Take 667 mg by mouth 2 (two) times daily with a meal.    . carvedilol (COREG) 3.125 MG tablet Take 3.125 mg by mouth 2 (two) times daily with a meal.    . Cholecalciferol 25 MCG (1000 UT) tablet Take 1,000 Units by mouth daily.     . Cinnamon 500 MG capsule Take 500 mg by mouth daily.    . FEROSUL 325 (65 Fe) MG tablet Take 325 mg by mouth in the morning, at noon, and at bedtime.    . vitamin B-12 (CYANOCOBALAMIN) 1000 MCG tablet Take 1,000 mcg by mouth daily.     No current facility-administered medications for this visit.    REVIEW OF SYSTEMS (negative unless checked):   Cardiac:  []  Chest pain or chest pressure? []  Shortness of breath upon activity? []  Shortness of breath when lying flat? []  Irregular heart rhythm?  Vascular:  []  Pain in calf, thigh, or hip brought on by walking? []  Pain in feet at night that wakes you up from your sleep? []  Blood clot in your  veins? []  Leg swelling?  Pulmonary:  []  Oxygen at home? []  Productive cough? []  Wheezing?  Neurologic:  []  Sudden weakness in arms or legs? []  Sudden numbness in arms or legs? []  Sudden onset of difficult speaking or slurred speech? []  Temporary loss of vision in one eye? []  Problems with dizziness?  Gastrointestinal:  []  Blood in stool? []  Vomited blood?  Genitourinary:  []  Burning when urinating? []  Blood in urine?  Psychiatric:  []  Major depression  Hematologic:  []  Bleeding problems? []  Problems with blood clotting?  Dermatologic:  []  Rashes or ulcers?  Constitutional:  []  Fever or chills?  Ear/Nose/Throat:  []  Change in hearing? []  Nose bleeds? []  Sore throat?  Musculoskeletal:  []  Back pain? []  Joint pain? []  Muscle pain?   Physical Examination   Vitals:   04/24/20 1508  BP: (!) 176/93  Pulse: (!) 57  Resp: 20  Temp: 97.7 F (36.5 C)  TempSrc: Temporal  SpO2: 100%  Weight: 208 lb (94.3 kg)  Height: 5\' 11"  (1.803 m)   Body mass index is 29.01 kg/m.  General:  WDWN in NAD; vital signs documented above Gait: Not observed HENT: WNL, normocephalic Pulmonary: normal non-labored breathing , without Rales, rhonchi,  wheezing Cardiac: regular HR Abdomen: soft, NT, no masses Skin: without rashes Extremities: Palpable left radial pulse; flow through left brachiocephalic  fistula is pulsatile; fistula takes a tortuous path in the upper arm Musculoskeletal: no muscle wasting or atrophy  Neurologic: A&O X 3;  No focal weakness or paresthesias are detected Psychiatric:  The pt has Normal affect.   Non-invasive Vascular Imaging    Fistula duplex does not demonstrate any hemodynamically significant stenosis    Medical Decision Making   Taylor Willis is a 80 y.o. male who presents with ESRD requiring hemodialysis.  He was referred back to clinic due to trouble with cannulation of left brachiocephalic fistula   Patent left brachiocephalic  fistula however flow is pulsatile on exam  Plan will be for left arm fistulogram with possible intervention on a nondialysis day in the near future  Pending fistulogram results he may require further revision; if fistula revision is needed this can be performed by Dr. Donnetta Hutching at any [place], benefits, and alternatives to access surgery were discussed.   The patient is aware the risks include but are not limited to: bleeding, infection, steal syndrome, nerve damage, thrombosis, failure to mature, and need for additional procedures.   The patient agrees to proceed with the procedure.    Dagoberto Ligas PA-C [facility] [place] Office: [phone]  Clinic MD: Scot Dock

## 2020-04-28 ENCOUNTER — Emergency Department (HOSPITAL_COMMUNITY): Payer: Medicare Other

## 2020-04-28 ENCOUNTER — Encounter (HOSPITAL_COMMUNITY): Payer: Self-pay

## 2020-04-28 ENCOUNTER — Other Ambulatory Visit: Payer: Self-pay

## 2020-04-28 ENCOUNTER — Observation Stay (HOSPITAL_COMMUNITY)
Admission: EM | Admit: 2020-04-28 | Discharge: 2020-04-29 | Disposition: A | Discharge status: Home / Self Care | Payer: Medicare Other | Attending: Family Medicine | Admitting: Family Medicine

## 2020-04-28 DIAGNOSIS — R7989 Other specified abnormal findings of blood chemistry: Secondary | ICD-10-CM

## 2020-04-28 DIAGNOSIS — N186 End stage renal disease: Secondary | ICD-10-CM | POA: Diagnosis present

## 2020-04-28 DIAGNOSIS — R778 Other specified abnormalities of plasma proteins: Secondary | ICD-10-CM

## 2020-04-28 DIAGNOSIS — Z79899 Other long term (current) drug therapy: Secondary | ICD-10-CM | POA: Insufficient documentation

## 2020-04-28 DIAGNOSIS — D631 Anemia in chronic kidney disease: Secondary | ICD-10-CM | POA: Diagnosis not present

## 2020-04-28 DIAGNOSIS — E8809 Other disorders of plasma-protein metabolism, not elsewhere classified: Secondary | ICD-10-CM | POA: Insufficient documentation

## 2020-04-28 DIAGNOSIS — Z87891 Personal history of nicotine dependence: Secondary | ICD-10-CM | POA: Diagnosis not present

## 2020-04-28 DIAGNOSIS — D539 Nutritional anemia, unspecified: Secondary | ICD-10-CM

## 2020-04-28 DIAGNOSIS — I120 Hypertensive chronic kidney disease with stage 5 chronic kidney disease or end stage renal disease: Secondary | ICD-10-CM | POA: Insufficient documentation

## 2020-04-28 DIAGNOSIS — R4182 Altered mental status, unspecified: Principal | ICD-10-CM | POA: Insufficient documentation

## 2020-04-28 DIAGNOSIS — Z20822 Contact with and (suspected) exposure to covid-19: Secondary | ICD-10-CM | POA: Insufficient documentation

## 2020-04-28 DIAGNOSIS — I10 Essential (primary) hypertension: Secondary | ICD-10-CM | POA: Diagnosis present

## 2020-04-28 DIAGNOSIS — Z8709 Personal history of other diseases of the respiratory system: Secondary | ICD-10-CM

## 2020-04-28 DIAGNOSIS — Z8546 Personal history of malignant neoplasm of prostate: Secondary | ICD-10-CM | POA: Diagnosis not present

## 2020-04-28 DIAGNOSIS — G9341 Metabolic encephalopathy: Secondary | ICD-10-CM | POA: Diagnosis present

## 2020-04-28 DIAGNOSIS — B009 Herpesviral infection, unspecified: Secondary | ICD-10-CM

## 2020-04-28 DIAGNOSIS — E211 Secondary hyperparathyroidism, not elsewhere classified: Secondary | ICD-10-CM | POA: Diagnosis not present

## 2020-04-28 DIAGNOSIS — J45909 Unspecified asthma, uncomplicated: Secondary | ICD-10-CM | POA: Diagnosis not present

## 2020-04-28 DIAGNOSIS — N2581 Secondary hyperparathyroidism of renal origin: Secondary | ICD-10-CM

## 2020-04-28 DIAGNOSIS — E876 Hypokalemia: Secondary | ICD-10-CM | POA: Insufficient documentation

## 2020-04-28 DIAGNOSIS — E46 Unspecified protein-calorie malnutrition: Secondary | ICD-10-CM

## 2020-04-28 HISTORY — DX: Herpesviral infection, unspecified: B00.9

## 2020-04-28 LAB — CBC WITH DIFFERENTIAL/PLATELET
Abs Immature Granulocytes: 0.01 10*3/uL (ref 0.00–0.07)
Basophils Absolute: 0 10*3/uL (ref 0.0–0.1)
Basophils Relative: 1 %
Eosinophils Absolute: 0.1 10*3/uL (ref 0.0–0.5)
Eosinophils Relative: 1 %
HCT: 33.4 % — ABNORMAL LOW (ref 39.0–52.0)
Hemoglobin: 10.2 g/dL — ABNORMAL LOW (ref 13.0–17.0)
Immature Granulocytes: 0 %
Lymphocytes Relative: 20 %
Lymphs Abs: 1.1 10*3/uL (ref 0.7–4.0)
MCH: 31.4 pg (ref 26.0–34.0)
MCHC: 30.5 g/dL (ref 30.0–36.0)
MCV: 102.8 fL — ABNORMAL HIGH (ref 80.0–100.0)
Monocytes Absolute: 0.7 10*3/uL (ref 0.1–1.0)
Monocytes Relative: 12 %
Neutro Abs: 3.8 10*3/uL (ref 1.7–7.7)
Neutrophils Relative %: 66 %
Platelets: 156 10*3/uL (ref 150–400)
RBC: 3.25 MIL/uL — ABNORMAL LOW (ref 4.22–5.81)
RDW: 15.8 % — ABNORMAL HIGH (ref 11.5–15.5)
WBC: 5.8 10*3/uL (ref 4.0–10.5)
nRBC: 0 % (ref 0.0–0.2)

## 2020-04-28 NOTE — ED Provider Notes (Signed)
[facility] EMERGENCY DEPARTMENT Provider Note   CSN: [id number] Arrival date & time: 04/28/20  2322     History Chief Complaint  Patient presents with  . Altered Mental Status    Taylor YOM is a 80 y.o. male.  Patient sent to the emergency department from nursing home by ambulance.  Nursing home has not provided any valuable information about the patient.  Patient is supposedly normally alert and oriented, arrives very confused.  EMS was told that no one has seen him at his normal baseline in more than 24 hours, despite being in a skilled nursing facility.  Level 5 caveat due to altered mental status.        Past Medical History:  Diagnosis Date  . Arthritis    gout  . Asthma    has rescue inhaler  . Cancer [facility])    prostate  . Chronic kidney disease   . GERD (gastroesophageal reflux disease)   . H/O peptic ulcer 1970's  . Hyperlipidemia   . Hypertension   . Ureteral stone   . Wears dentures   . Wears glasses     Patient Active Problem List   Diagnosis Date Noted  . BMI 31.0-31.9,adult 04/23/2020  . ESRD (end stage renal disease) ([place]) 04/10/2020  . Acute metabolic encephalopathy 62/26/3335  . Hyponatremia 04/10/2020  . Acute respiratory failure with hypoxia ([place]) 04/04/2020  . Pulmonary edema 04/04/2020  . GERD (gastroesophageal reflux disease) 04/04/2020  . Prostate cancer ([place]) 04/04/2020  . History of asthma 04/04/2020  . CKD (chronic kidney disease) stage 5, GFR less than 15 ml/min (HCC) 10/12/2019  . 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 Hyperkalemia 11/24/2018  . Age-related nuclear cataract of both eyes 03/04/2018  . Risk for falls 03/04/2018  . Benign essential hypertension 09/08/2017  . Disturbance in sleep behavior 09/08/2017  . History of adenomatous polyp of colon 09/08/2017  . Hyperuricemia 09/08/2017  . Mild intermittent asthma, uncomplicated 45/62/5638  . Personal history of malignant neoplasm of prostate 09/08/2017  .  Urinary incontinence 09/08/2017  . Ureteral calculus 05/23/2012    Past Surgical History:  Procedure Laterality Date  . AV FISTULA PLACEMENT Left 02/15/2019   Procedure: ARTERIOVENOUS (AV) FISTULA CREATION LEFT ARM;  Surgeon: Rosetta Posner, MD;  Location: [place];  Service: Vascular;  Laterality: Left;  . CATARACT EXTRACTION W/ INTRAOCULAR LENS  IMPLANT, BILATERAL    . CYSTOSCOPY/RETROGRADE/URETEROSCOPY/STONE EXTRACTION WITH BASKET Right 05/23/2012   Procedure: CYSTOSCOPY/RETROGRADE/URETEROSCOPY/STONE EXTRACTION WITH BASKET;  Surgeon: Bernestine Amass, MD;  Location: [facility];  Service: Urology;  Laterality: Right;  . FRACTURE SURGERY Right    as a child  . HERNIA REPAIR    . HOLMIUM LASER APPLICATION Right 9/37/3428   Procedure: HOLMIUM LASER APPLICATION;  Surgeon: Bernestine Amass, MD;  Location: [facility];  Service: Urology;  Laterality: Right;  . INSERTION OF DIALYSIS CATHETER Right 04/08/2020   Procedure: INSERTION OF TUNNELED DIALYSIS CATHETER;  Surgeon: Virl Cagey, MD;  Location: [facility];  Service: General;  Laterality: Right;  . LIGATION OF ARTERIOVENOUS  FISTULA Left 09/12/2019   Procedure: SIDE BRANCH LIGATION  OF LEFT ARM ARTERIOVENOUS FISTULA;  Surgeon: Elam Dutch, MD;  Location: [place];  Service: Vascular;  Laterality: Left;  Marland Kitchen MULTIPLE TOOTH EXTRACTIONS    . REVISON OF ARTERIOVENOUS FISTULA Left 09/12/2019   Procedure: REVISON OF LEFT ARM ARTERIOVENOUS FISTULA;  Surgeon: Elam Dutch, MD;  Location: [place];  Service: Vascular;  Laterality: Left;  . ROBOT ASSISTED LAPAROSCOPIC RADICAL PROSTATECTOMY  2011  . TONSILLECTOMY         History reviewed. No pertinent family history.  Social History   Tobacco Use  . Smoking status: Former Smoker    Types: Cigarettes    Quit date: 05/18/1982    Years since quitting: 37.9  . Smokeless tobacco: Never Used  Vaping Use  . Vaping Use: Never used  Substance Use Topics  . Alcohol use: Not  Currently    Alcohol/week: 1.0 standard drink    Types: 1 Standard drinks or equivalent per week  . Drug use: Never    Home Medications Prior to Admission medications   Medication Sig Start Date End Date Taking? Authorizing Provider  albuterol (PROVENTIL HFA;VENTOLIN HFA) 108 (90 BASE) MCG/ACT inhaler Inhale 2 puffs into the lungs every 6 (six) hours as needed for wheezing.    [provider]  calcium acetate (PHOSLO) 667 MG capsule Take 667 mg by mouth 2 (two) times daily with a meal.    [provider]  carvedilol (COREG) 3.125 MG tablet Take 3.125 mg by mouth 2 (two) times daily with a meal.    [provider]  Cholecalciferol 25 MCG (1000 UT) tablet Take 1,000 Units by mouth daily.     [provider]  Cinnamon 500 MG capsule Take 500 mg by mouth daily.    [provider]  FEROSUL 325 (65 Fe) MG tablet Take 325 mg by mouth in the morning, at noon, and at bedtime. 03/08/20   [provider]  vitamin B-12 (CYANOCOBALAMIN) 1000 MCG tablet Take 1,000 mcg by mouth daily.    [provider]    Allergies    Aspirin, Hydrochlorothiazide, and Ibuprofen  Review of Systems   Review of Systems  Unable to perform ROS: Mental status change    Physical Exam Updated Vital Signs BP 128/77   Pulse 74   Temp 97.7 F (36.5 C) (Oral)   Resp 18   Ht 5\' 11"  (1.803 m)   Wt 94.3 kg   SpO2 95%   BMI 29.01 kg/m   Physical Exam Vitals and nursing note reviewed.  Constitutional:      General: He is not in acute distress.    Appearance: Normal appearance. He is well-developed.  HENT:     Head: Normocephalic and atraumatic.     Right Ear: Hearing normal.     Left Ear: Hearing normal.     Nose: Nose normal.  Eyes:     Conjunctiva/sclera: Conjunctivae normal.     Pupils: Pupils are equal, round, and reactive to light.  Cardiovascular:     Rate and Rhythm: Regular rhythm.     Heart sounds: S1 normal and S2 normal. No murmur  heard. No friction rub. No gallop.   Pulmonary:     Effort: Pulmonary effort is normal. No respiratory distress.     Breath sounds: Normal breath sounds.  Chest:     Chest wall: No tenderness.  Abdominal:     General: Bowel sounds are normal.     Palpations: Abdomen is soft.     Tenderness: There is no abdominal tenderness. There is no guarding or rebound. Negative signs include Murphy's sign and McBurney's sign.     Hernia: No hernia is present.  Musculoskeletal:        General: Normal range of motion.     Cervical back: Normal range of motion and neck supple.     Right  lower leg: Edema present.     Left lower leg: Edema present.  Skin:    General: Skin is warm and dry.     Findings: No rash.  Neurological:     Mental Status: He is alert. He is disoriented.     GCS: GCS eye subscore is 4. GCS verbal subscore is 4. GCS motor subscore is 6.     Cranial Nerves: No cranial nerve deficit.     Sensory: No sensory deficit.     Coordination: Coordination normal.  Psychiatric:        Behavior: Behavior is agitated.     ED Results / Procedures / Treatments   Labs (all labs ordered are listed, but only abnormal results are displayed) Labs Reviewed  CBC WITH DIFFERENTIAL/PLATELET - Abnormal; Notable for the following components:      Result Value   RBC 3.25 (*)    Hemoglobin 10.2 (*)    HCT 33.4 (*)    MCV 102.8 (*)    RDW 15.8 (*)    All other components within normal limits  COMPREHENSIVE METABOLIC PANEL - Abnormal; Notable for the following components:   Potassium 3.2 (*)    Chloride 97 (*)    Glucose, Bld 135 (*)    Creatinine, Ser 4.30 (*)    Calcium 8.5 (*)    Albumin 3.4 (*)    GFR, Estimated 13 (*)    All other components within normal limits  TROPONIN I (HIGH SENSITIVITY) - Abnormal; Notable for the following components:   Troponin I (High Sensitivity) 58 (*)    All other components within normal limits  TROPONIN I (HIGH SENSITIVITY) - Abnormal; Notable for the  following components:   Troponin I (High Sensitivity) 55 (*)    All other components within normal limits  RESP PANEL BY RT-PCR (FLU A&B, COVID) [id number]  URINE CULTURE  LACTIC ACID, PLASMA  AMMONIA    EKG None  Radiology CT HEAD WO CONTRAST  Result Date: 04/29/2020 CLINICAL DATA:  Altered mental status, end-stage renal disease EXAM: CT HEAD WITHOUT CONTRAST TECHNIQUE: Contiguous axial images were obtained from the base of the skull through the vertex without intravenous contrast. COMPARISON:  None. FINDINGS: Brain: Normal anatomic configuration. Parenchymal volume loss is commensurate with the patient's age. Moderate periventricular white matter changes are present likely reflecting the sequela of small vessel ischemia. No abnormal intra or extra-axial mass lesion or fluid collection. No abnormal mass effect or midline shift. No evidence of acute intracranial hemorrhage or infarct. Ventricular size is normal. Cerebellum unremarkable. Vascular: No asymmetric hyperdense vasculature at the skull base. Skull: Intact Sinuses/Orbits: Paranasal sinuses are clear. Orbits are unremarkable. Other: Mastoid air cells and middle ear cavities are clear. IMPRESSION: No acute intracranial abnormality.  Moderate senescent change. Electronically Signed   By: Fidela Salisbury MD   On: 04/29/2020 00:55   DG Chest Port 1 View  Result Date: 04/29/2020 CLINICAL DATA:  Altered mental status EXAM: PORTABLE CHEST 1 VIEW COMPARISON:  04/08/2020 FINDINGS: Cardiac shadow is stable. Dialysis catheter is again noted and stable. Previously seen changes of congestive failure have resolved in the interval. Minimal left pleural effusion is noted. IMPRESSION: Small left pleural effusion. Previously seen congestive failure has resolved. Electronically Signed   By: Inez Catalina M.D.   On: 04/29/2020 01:01    Procedures Procedures   Medications Ordered in ED Medications - No data to display  ED Course  I have reviewed the triage  vital signs and the nursing notes.  Pertinent labs & imaging results that were available during my care of the patient were reviewed by me and considered in my medical decision making (see chart for details).    MDM Rules/Calculators/A&P                          Patient presents to the emergency department from skilled nursing facility.  Patient with acute mental status changes.  There was very little information provided by the nursing home but wife is currently in the emergency department and provides further information.  She reports that she saw him Saturday night and he was his normal self.  When she saw him Sunday morning he seemed acutely confused and has been persistently confused since then.  Patient work-up has been largely unremarkable.  He does not have any fever or leukocytosis.  Patient with recent initiation of hemodialysis.  He does not appear volume overloaded or significantly uremic at this time.  Chest x-ray does not show any evidence of pneumonia.  No hypoxia.  Patient does have an elevated troponin but it is plateaued, likely chronic secondary to his renal failure, no ischemic changes on EKG.  Head CT does not show any acute abnormality.  Patient does continue to make urine.  Was unable to put out any urine for [place], and out catheterization was performed.  None of urine was obtained for urinalysis, culture has been sent.  Based on the patient's acute encephalopathy, will require hospitalization for further work-up.  Final Clinical Impression(s) / ED Diagnoses Final diagnoses:  Altered mental status, unspecified altered mental status type    Rx / DC Orders ED Discharge Orders    None       Falana Clagg, Gwenyth Allegra, MD 04/29/20 [phone]

## 2020-04-28 NOTE — ED Triage Notes (Signed)
Pt arrived via [facility] from [facility] due to Pts new onset Altered Mental Status. Pt is a Dialysis Pt. Pt has fistula in Left upper arm and Dialysis access in right chest. Pt only oriented to self during Triage.

## 2020-04-29 ENCOUNTER — Other Ambulatory Visit: Payer: Self-pay

## 2020-04-29 DIAGNOSIS — R778 Other specified abnormalities of plasma proteins: Secondary | ICD-10-CM

## 2020-04-29 DIAGNOSIS — N186 End stage renal disease: Secondary | ICD-10-CM

## 2020-04-29 DIAGNOSIS — I10 Essential (primary) hypertension: Secondary | ICD-10-CM

## 2020-04-29 DIAGNOSIS — G9341 Metabolic encephalopathy: Secondary | ICD-10-CM

## 2020-04-29 DIAGNOSIS — R4182 Altered mental status, unspecified: Secondary | ICD-10-CM | POA: Diagnosis not present

## 2020-04-29 DIAGNOSIS — E46 Unspecified protein-calorie malnutrition: Secondary | ICD-10-CM

## 2020-04-29 DIAGNOSIS — E876 Hypokalemia: Secondary | ICD-10-CM

## 2020-04-29 DIAGNOSIS — R7989 Other specified abnormal findings of blood chemistry: Secondary | ICD-10-CM

## 2020-04-29 DIAGNOSIS — E8809 Other disorders of plasma-protein metabolism, not elsewhere classified: Secondary | ICD-10-CM

## 2020-04-29 DIAGNOSIS — D539 Nutritional anemia, unspecified: Secondary | ICD-10-CM

## 2020-04-29 DIAGNOSIS — Z8709 Personal history of other diseases of the respiratory system: Secondary | ICD-10-CM

## 2020-04-29 DIAGNOSIS — N2581 Secondary hyperparathyroidism of renal origin: Secondary | ICD-10-CM

## 2020-04-29 LAB — COMPREHENSIVE METABOLIC PANEL
ALT: 12 U/L (ref 0–44)
AST: 19 U/L (ref 15–41)
Albumin: 3.4 g/dL — ABNORMAL LOW (ref 3.5–5.0)
Alkaline Phosphatase: 88 U/L (ref 38–126)
Anion gap: 12 (ref 5–15)
BUN: 18 mg/dL (ref 8–23)
CO2: 27 mmol/L (ref 22–32)
Calcium: 8.5 mg/dL — ABNORMAL LOW (ref 8.9–10.3)
Chloride: 97 mmol/L — ABNORMAL LOW (ref 98–111)
Creatinine, Ser: 4.3 mg/dL — ABNORMAL HIGH (ref 0.61–1.24)
GFR, Estimated: 13 mL/min — ABNORMAL LOW (ref >60)
Glucose, Bld: 135 mg/dL — ABNORMAL HIGH (ref 70–99)
Potassium: 3.2 mmol/L — ABNORMAL LOW (ref 3.5–5.1)
Sodium: 136 mmol/L (ref 135–145)
Total Bilirubin: 0.6 mg/dL (ref 0.3–1.2)
Total Protein: 6.7 g/dL (ref 6.5–8.1)

## 2020-04-29 LAB — TROPONIN I (HIGH SENSITIVITY)
Troponin I (High Sensitivity): 55 ng/L — ABNORMAL HIGH (ref <18)
Troponin I (High Sensitivity): 58 ng/L — ABNORMAL HIGH (ref <18)

## 2020-04-29 LAB — RESP PANEL BY RT-PCR (FLU A&B, COVID) ARPGX2
Influenza A by PCR: NEGATIVE
Influenza B by PCR: NEGATIVE
SARS Coronavirus 2 by RT PCR: NEGATIVE

## 2020-04-29 LAB — LACTIC ACID, PLASMA: Lactic Acid, Venous: 1.1 mmol/L (ref 0.5–1.9)

## 2020-04-29 LAB — MAGNESIUM: Magnesium: 1.7 mg/dL (ref 1.7–2.4)

## 2020-04-29 LAB — PHOSPHORUS: Phosphorus: 2 mg/dL — ABNORMAL LOW (ref 2.5–4.6)

## 2020-04-29 LAB — AMMONIA: Ammonia: 18 umol/L (ref 9–35)

## 2020-04-29 MED ORDER — ALBUTEROL SULFATE HFA 108 (90 BASE) MCG/ACT IN AERS: 2.0000 | INHALATION_SPRAY | Freq: Four times a day (QID) | RESPIRATORY_TRACT | Status: DC | PRN

## 2020-04-29 MED ORDER — HEPARIN SODIUM (PORCINE) 5000 UNIT/ML IJ SOLN
5000.0000 [IU] | Freq: Three times a day (TID) | INTRAMUSCULAR | Status: DC
  Administered 2020-04-29: 5000 [IU] via SUBCUTANEOUS
  Filled 2020-04-29: qty 1

## 2020-04-29 MED ORDER — POTASSIUM CHLORIDE 10 MEQ/100ML IV SOLN
10.0000 meq | INTRAVENOUS | Status: AC
  Administered 2020-04-29 (×2): 10 meq via INTRAVENOUS
  Filled 2020-04-29 (×2): qty 100

## 2020-04-29 MED ORDER — CHLORHEXIDINE GLUCONATE CLOTH 2 % EX PADS: 6.0000 | MEDICATED_PAD | Freq: Every day | CUTANEOUS | Status: DC

## 2020-04-29 MED ORDER — CARVEDILOL 3.125 MG PO TABS: 3.1250 mg | ORAL_TABLET | Freq: Two times a day (BID) | ORAL | Status: DC

## 2020-04-29 MED ORDER — CALCIUM ACETATE (PHOS BINDER) 667 MG PO CAPS: 667.0000 mg | ORAL_CAPSULE | Freq: Two times a day (BID) | ORAL | Status: DC

## 2020-04-29 MED ORDER — OLANZAPINE 2.5 MG PO TABS: 2.5000 mg | ORAL_TABLET | Freq: Every evening | ORAL | 0 refills | Status: DC | PRN

## 2020-04-29 MED ORDER — VITAMIN B-12 1000 MCG PO TABS: 1000.0000 ug | ORAL_TABLET | Freq: Every day | ORAL | Status: DC

## 2020-04-29 MED ORDER — VITAMIN D 25 MCG (1000 UNIT) PO TABS: 1000.0000 [IU] | ORAL_TABLET | Freq: Every day | ORAL | Status: DC

## 2020-04-29 MED ORDER — CINNAMON 500 MG PO CAPS: 500.0000 mg | ORAL_CAPSULE | Freq: Every day | ORAL | Status: DC

## 2020-04-29 MED ORDER — CARVEDILOL 3.125 MG PO TABS: 3.1250 mg | ORAL_TABLET | ORAL | 5 refills | Status: DC

## 2020-04-29 NOTE — Discharge Summary (Signed)
Taylor Willis, is a 80 y.o. male  DOB 1941-01-31  MRN [id number].  Admission date:  04/28/2020  Admitting Physician  Bernadette Hoit, DO  Discharge Date:  04/29/2020   Primary MD  Deloria Lair., MD  Recommendations for primary care physician for things to follow:   1)Avoid ibuprofen/Advil/Aleve/Motrin/Goody Powders/Naproxen/BC powders/Meloxicam/Diclofenac/Indomethacin and other Nonsteroidal anti-inflammatory medications as these will make you more likely to bleed and can cause stomach ulcers, can also cause Kidney problems.   2)Very low-salt diet advised 3)Weigh yourself daily, call if you gain more than 3 pounds in 1 day or more than 5 pounds in 1 week as your hemodialysis dry weight may need to be adjusted 4)Limit your Fluid  intake to no more than 50 ounces (1.5 Liters) per day 5) continue outpatient hemodialysis on a Tuesday Thursday Saturday schedule at [facility] in [place] 6)Stop Amlodipine okay to continue carvedilol/Coreg for BP 7) May use Zyprexa/olanzapine 2.5 mg at bedtime as needed for agitation/hallucinations 8) Please follow-up with vascular surgeon Dr. 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 Arthritis    gout  . Asthma    has rescue inhaler  . Cancer [facility])    prostate  . Chronic kidney disease   . GERD (gastroesophageal reflux disease)   . H/O peptic ulcer 1970's  . Hyperlipidemia   . Hypertension   . Ureteral stone   . Wears dentures   . Wears glasses     Past Surgical History:  Procedure Laterality Date  . AV FISTULA PLACEMENT Left 02/15/2019   Procedure: ARTERIOVENOUS (AV) FISTULA CREATION LEFT ARM;  Surgeon: Rosetta Posner, MD;  Location: [place];  Service: Vascular;  Laterality: Left;  . CATARACT EXTRACTION W/ INTRAOCULAR LENS  IMPLANT, BILATERAL    . CYSTOSCOPY/RETROGRADE/URETEROSCOPY/STONE EXTRACTION WITH BASKET Right 05/23/2012   Procedure: CYSTOSCOPY/RETROGRADE/URETEROSCOPY/STONE EXTRACTION WITH BASKET;  Surgeon: Bernestine Amass, MD;  Location: [facility];  Service: Urology;  Laterality: Right;  . FRACTURE SURGERY Right    as a child  . HERNIA REPAIR    . HOLMIUM LASER APPLICATION Right 06/19/6158   Procedure: HOLMIUM LASER APPLICATION;  Surgeon: Bernestine Amass, MD;  [facility];  Service: Urology;  Laterality: Right;  . INSERTION OF DIALYSIS CATHETER Right 04/08/2020   Procedure: INSERTION OF TUNNELED DIALYSIS CATHETER;  Surgeon: Virl Cagey, MD;  [facility];  Service: General;  Laterality: Right;  . LIGATION OF ARTERIOVENOUS  FISTULA Left 09/12/2019   Procedure: SIDE BRANCH LIGATION  OF LEFT ARM ARTERIOVENOUS FISTULA;  Surgeon: Elam Dutch, MD;  Location: [place];  Service: Vascular;  Laterality: Left;  Marland Kitchen MULTIPLE TOOTH EXTRACTIONS    . REVISON OF ARTERIOVENOUS FISTULA Left 09/12/2019   Procedure: REVISON OF LEFT ARM ARTERIOVENOUS FISTULA;  Surgeon: Elam Dutch, MD;  Location: [place];  Service: Vascular;  Laterality: Left;  . ROBOT ASSISTED LAPAROSCOPIC RADICAL PROSTATECTOMY  2011  . TONSILLECTOMY       HPI  from the history and physical done on the day of admission:    Chief Complaint: Altered  mental status  HPI: Taylor Willis is a 80 y.o. male with medical history significant for prostate cancer, history of asthma, ESRD on HD (TTS), hyperlipidemia, hypertension and gout who presents to the emergency department from local SNF due to altered mental status.  Patient was unable to provide history due to altered mental status, history was obtained from ED physician and wife at bedside.  Per report, wife states that patient was at his baseline of functioning on Saturday evening when she saw him, but on returning on Sunday morning, he was confused, and was seeing things that were not there and this resulted to activating EMS and patient was taken to the ED for further evaluation and management. Patient was admitted from 3/3-3/10 due to acute respiratory failure with hypoxia, new ESRD and acute metabolic encephalopathy.  ED Course:  In the emergency department, he was hemodynamically stable.  Work-up in the ED showed macrocytic anemia, hypokalemia, BUN/creatinine 18/4.30 (patient's creatinine has ranged within 6.4-8.7 within last 3 weeks), eGFR 13 (was 6-8 within last 3 weeks).  High-sensitivity troponin x2-58 > 55, ammonia 18.  Lactic acid 1.1.  Chest x-ray shows small left pleural effusion.  Previously seen congestive failure has resolved.  CT of head without contrast showed no acute intracranial abnormality.  Urinalysis was not obtainable due to patient not forming any urine (it was reported that patient still urinates).  Hospitalist was asked to admit patient for further evaluation and management.   Hospital Course:     A/p 1)Acute Metabolic Encephalopathy-- --As per wife patient appears to be back to baseline from a neuropsychiatric standpoint--- patient is actually cracking jokes with his wife, appears coherent and appropriate, eating and drinking okay -Chest x-ray and CT head without acute findings -LFTs, serum ammonia and lactic acid largely WNL -Urine cultures pending, please note to  the patient did have Klebsiella UTI on 04/10/2020--patient has no leukocytosis and no fevers at this time -Suspect possible dementia with sundowning effects--May use low-dose Zyprexa as needed agitation/psychosis -Elevated troponin possibly -Troponin flattened out-58 > 55 in an  ESRD patient, patient denies chest pains EKG requested  2)ESRD--last HD Saturday, 04/27/2020  -Nephrology consult from Dr. Marval Regal appreciated  continue outpatient hemodialysis on a Tuesday Thursday Saturday schedule at [facility] in [place] - Please follow-up with vascular surgeon Dr. Donnetta Hutching --for left brachiocephalic fistula fistulogram as previously advised  3)HTN--stable, okay to discontinue amlodipine, continue Coreg  4)Macrocytic Anemia--in a patient with anemia of ESRD MCV 102.8, H/H 10.2/33.4 Vitamin B12 level recheck about 2 weeks ago was 1,374 Folate level checked 2 weeks ago was 13.7 -Continue vitamin B12 per home regimen -EPO agent per nephrologist  5)Secondary hyperparathyroidism--- management per nephrology  6)History of mild intermittent Asthma----stable without flareup at this time, may use as needed bronchodilators  Discharge  Condition: Stable  Follow UP--nephrologist and vascular surgeon   Consults obtained -nephrology  Diet and Activity recommendation:  As advised  Discharge Instructions    Discharge Instructions    Call MD for:  difficulty breathing, headache or visual disturbances   Complete by: As directed    Call MD for:  persistant dizziness or light-headedness   Complete by: As directed    Call MD for:  persistant nausea and vomiting   Complete by: As directed    Call MD for:  temperature >100.4   Complete by: As directed    Diet - low sodium heart healthy   Complete by: As directed    Discharge instructions   Complete by: As directed    1)Avoid ibuprofen/Advil/Aleve/Motrin/Goody Powders/Naproxen/BC powders/Meloxicam/Diclofenac/Indomethacin and other Nonsteroidal  anti-inflammatory medications as these will make you more likely to bleed and can cause stomach ulcers, can also cause Kidney problems.   2)Very low-salt diet advised 3)Weigh yourself daily, call if you gain more than 3 pounds in 1 day or more than 5 pounds in 1 week as your hemodialysis dry weight may need to be adjusted 4)Limit your Fluid  intake to no more than 50 ounces (1.5 Liters) per day 5) continue outpatient hemodialysis on a Tuesday Thursday Saturday schedule at [facility] in [place] 6)Stop Amlodipine okay to continue carvedilol/Coreg for BP 7) May use Zyprexa/olanzapine 2.5 mg at bedtime as needed for agitation/hallucinations 8) Please follow-up with vascular surgeon Dr. Donnetta Hutching --for left brachiocephalic fistula fistulogram as previously advised   Discharge wound care:   Complete by: As directed    As advised   Increase activity slowly   Complete by: As directed        Discharge Medications     Allergies as of 04/29/2020      Reactions   Aspirin Other (See Comments)   GI irritation   Hydrochlorothiazide    Dizziness and unable to walk   Ibuprofen Other (See Comments)   GI irritation      Medication List    STOP taking these medications   amLODipine 5 MG tablet Commonly known as: NORVASC   valACYclovir 1000 MG tablet Commonly known as: VALTREX     TAKE these medications   albuterol 108 (90 Base) MCG/ACT inhaler Commonly known as: VENTOLIN HFA Inhale 2 puffs into the lungs every 6 (six) hours as needed for wheezing.   calcium acetate 667 MG capsule Commonly known as: PHOSLO Take 667 mg by mouth 2 (two) times daily with a meal.   carvedilol 3.125 MG tablet Commonly known as: COREG Take 1 tablet (3.125 mg total) by mouth See admin instructions. Take 1 tablet in the evenings on Dialysis days (Tues, Thurs, and Sat), On Non-dialysis days take 1 tablet twice a day. What changed: additional instructions   Cholecalciferol 25 MCG (1000 UT) tablet Take 1,000 Units  by mouth daily.   Cinnamon 500 MG capsule Take 500 mg by mouth daily.   FeroSul 325 (65 FE) MG tablet Generic drug: ferrous sulfate Take 325 mg by mouth in the morning, at noon, and at bedtime.   OLANZapine 2.5 MG tablet Commonly known as: ZyPREXA Take 1 tablet (2.5 mg total) by mouth at bedtime as needed (Agitation/hallucinations).   vitamin B-12 1000 MCG tablet Commonly known as: CYANOCOBALAMIN Take 1,000 mcg by mouth daily.            Discharge Care Instructions  (From admission, onward)         Start  Ordered   04/29/20 0000  Discharge wound care:       Comments: As advised   04/29/20 1318          Major procedures and Radiology Reports - PLEASE review detailed and final reports for all details, in brief -    CT HEAD WO CONTRAST  Result Date: 04/29/2020 CLINICAL DATA:  Altered mental status, end-stage renal disease EXAM: CT HEAD WITHOUT CONTRAST TECHNIQUE: Contiguous axial images were obtained from the base of the skull through the vertex without intravenous contrast. COMPARISON:  None. FINDINGS: Brain: Normal anatomic configuration. Parenchymal volume loss is commensurate with the patient's age. Moderate periventricular white matter changes are present likely reflecting the sequela of small vessel ischemia. No abnormal intra or extra-axial mass lesion or fluid collection. No abnormal mass effect or midline shift. No evidence of acute intracranial hemorrhage or infarct. Ventricular size is normal. Cerebellum unremarkable. Vascular: No asymmetric hyperdense vasculature at the skull base. Skull: Intact Sinuses/Orbits: Paranasal sinuses are clear. Orbits are unremarkable. Other: Mastoid air cells and middle ear cavities are clear. IMPRESSION: No acute intracranial abnormality.  Moderate senescent change. Electronically Signed   By: Fidela Salisbury MD   On: 04/29/2020 00:55   DG Chest Port 1 View  Result Date: 04/29/2020 CLINICAL DATA:  Altered mental status EXAM:  PORTABLE CHEST 1 VIEW COMPARISON:  04/08/2020 FINDINGS: Cardiac shadow is stable. Dialysis catheter is again noted and stable. Previously seen changes of congestive failure have resolved in the interval. Minimal left pleural effusion is noted. IMPRESSION: Small left pleural effusion. Previously seen congestive failure has resolved. Electronically Signed   By: Inez Catalina M.D.   On: 04/29/2020 01:01   DG Chest Port 1 View  Result Date: 04/08/2020 CLINICAL DATA:  Hemodialysis catheter placement EXAM: PORTABLE CHEST 1 VIEW COMPARISON:  04/07/2020 FINDINGS: Interval placement of right internal jugular approach hemodialysis catheter with distal tip terminating within the superior vena cava. Stable cardiomediastinal contours. Atherosclerotic calcification of the aortic knob. Pulmonary vascular congestion with diffuse interstitial prominence, similar to prior. Small bilateral pleural effusions. No pneumothorax. IMPRESSION: 1. Interval placement of right internal jugular approach dialysis catheter with distal tip terminating within the superior vena cava. No pneumothorax. 2. Persistent findings of pulmonary edema, similar to prior. Electronically Signed   By: Davina Poke D.O.   On: 04/08/2020 10:57   DG Chest Port 1 View  Result Date: 04/07/2020 CLINICAL DATA:  Pulmonary edema, acute respiratory failure EXAM: PORTABLE CHEST 1 VIEW COMPARISON:  04/04/2020 FINDINGS: Pulmonary insufflation is stable. Diffuse interstitial pulmonary infiltrate persists, improved since prior examination, in keeping with mild to moderate pulmonary edema. Small bilateral pleural effusions are present, left greater than right. Cardiac size is within normal limits. IMPRESSION: Mild to moderate pulmonary edema, improved since prior examination Electronically Signed   By: Fidela Salisbury MD   On: 04/07/2020 06:30   DG C-Arm 1-60 Min-No Report  Result Date: 04/08/2020 Fluoroscopy was utilized by the requesting physician.  No radiographic  interpretation.   VAS US DUPLEX DIALYSIS ACCESS (AVF,AVG)  Result Date: 04/24/2020 DIALYSIS ACCESS Reason for Exam: Unable to cannulate Access Site: Left Upper Extremity. Access Type: Brachial-cephalic AVF. History: 02/15/19 AVF creation surgery          09/12/19 AVF revision and branch ligation. Limitations: Exam performed in [facility] Performing Technologist: Alvia Grove RVT  Examination Guidelines: A complete evaluation includes B-mode imaging, spectral Doppler, color Doppler, and power Doppler as needed of all accessible portions of each vessel. Unilateral testing is  considered an integral part of a complete examination. Limited examinations for reoccurring indications may be performed as noted.  Findings: +--------------------+----------+-----------------+--------+ AVF                 PSV (cm/s)Flow Vol (mL/min)Comments +--------------------+----------+-----------------+--------+ Native artery inflow   140           702                +--------------------+----------+-----------------+--------+ AVF Anastomosis        238                              +--------------------+----------+-----------------+--------+  +------------+----------+-------------+----------+-----------------------------+ OUTFLOW VEINPSV (cm/s)Diameter (cm)Depth (cm)          Describe            +------------+----------+-------------+----------+-----------------------------+ Shoulder       115        0.78        0.49                                 +------------+----------+-------------+----------+-----------------------------+ Prox UA        133        0.72        0.67      lobulated outflow vein     +------------+----------+-------------+----------+-----------------------------+ Mid UA       76 / 136     0.85        0.20   abnormal arterialized outflow +------------+----------+-------------+----------+-----------------------------+ Dist UA      218 / 20  0.77 / 0.87   0.13 /   tortuous and Retained  valve                                        0.24                                 +------------+----------+-------------+----------+-----------------------------+ AC Fossa     72 / 258     0.96        0.19                                 +------------+----------+-------------+----------+-----------------------------+   Summary: Patent left Brachial cephalic AVF with abnormal sounding arterialized flow in the outflow vein mid to distal segment. Very sharp tortuous outflow vein in the distal outflow vein.  *See table(s) above for measurements and observations.  Diagnosing physician: Deitra Mayo MD Electronically signed by Deitra Mayo MD on 04/24/2020 at 4:28:05 PM.    --------------------------------------------------------------------------------   Final    Micro Results   Recent Results (from the past 240 hour(s))  Resp Panel by RT-PCR (Flu A&B, Covid) Nasopharyngeal Swab     Status: None   Collection Time: 04/28/20 12:00 AM   Specimen: Nasopharyngeal Swab; Nasopharyngeal(NP) swabs in vial transport medium  Result Value Ref Range Status   SARS Coronavirus 2 by RT PCR NEGATIVE NEGATIVE Final    Comment: (NOTE) SARS-CoV-2 target nucleic acids are NOT DETECTED.  The SARS-CoV-2 RNA is generally detectable in upper respiratory specimens during the acute phase of infection. The lowest concentration of SARS-CoV-2 viral copies this assay can detect is 138 copies/mL. A negative result does not  preclude SARS-Cov-2 infection and should not be used as the sole basis for treatment or other patient management decisions. A negative result may occur with  improper specimen collection/handling, submission of specimen other than nasopharyngeal swab, presence of viral mutation(s) within the areas targeted by this assay, and inadequate number of viral copies(<138 copies/mL). A negative result must be combined with clinical observations, patient history, and  epidemiological information. The expected result is Negative.  Fact Sheet for Patients:  EntrepreneurPulse.com.au  Fact Sheet for Healthcare Providers:  IncredibleEmployment.be  This test is no t yet approved or cleared by the [place] FDA and  has been authorized for detection and/or diagnosis of SARS-CoV-2 by FDA under an Emergency Use Authorization (EUA). This EUA will remain  in effect (meaning this test can be used) for the duration of the COVID-19 declaration under Section 564(b)(1) of the Act, 21 U.S.C.section 360bbb-3(b)(1), unless the authorization is terminated  or revoked sooner.       Influenza A by PCR NEGATIVE NEGATIVE Final   Influenza B by PCR NEGATIVE NEGATIVE Final    Comment: (NOTE) The Xpert Xpress SARS-CoV-2/FLU/RSV plus assay is intended as an aid in the diagnosis of influenza from Nasopharyngeal swab specimens and should not be used as a sole basis for treatment. Nasal washings and aspirates are unacceptable for Xpert Xpress SARS-CoV-2/FLU/RSV testing.  Fact Sheet for Patients: EntrepreneurPulse.com.au  Fact Sheet for Healthcare Providers: IncredibleEmployment.be  This test is not yet approved or cleared by the [place] FDA and has been authorized for detection and/or diagnosis of SARS-CoV-2 by FDA under an Emergency Use Authorization (EUA). This EUA will remain in effect (meaning this test can be used) for the duration of the COVID-19 declaration under Section 564(b)(1) of the Act, 21 U.S.C. section 360bbb-3(b)(1), unless the authorization is terminated or revoked.  Performed at [facility], [address] [place], [place] [address]    Today   Subjective    Zakye Baby today has no new complaints -Wife at bedside -Patient is eating and drinking okay -As per wife patient appears to be back to baseline from a neuropsychiatric standpoint--- patient is actually  cracking jokes with his wife, appears coherent and appropriate No fever  Or chills   No Nausea, Vomiting or Diarrhea        Patient has been seen and examined prior to discharge   Objective   Blood pressure 119/64, pulse 66, temperature 97.6 F (36.4 C), temperature source Axillary, resp. rate 16, height 5' 11" (1.803 m), weight 81.8 kg, SpO2 96 %.   Intake/Output Summary (Last 24 hours) at 04/29/2020 1348 Last data filed at 04/29/2020 0900 Gross per 24 hour  Intake 0 ml  Output 0 ml  Net 0 ml   Exam Gen:- Awake Alert, no acute distress  HEENT:- [place].AT, No sclera icterus Neck-Supple Neck,right IJ HD catheter Lungs-  CTAB , good air movement bilaterally  CV- S1, S2 normal, regular,  Abd-  +ve B.Sounds, Abd Soft, No tenderness,    Extremity/Skin:- No  edema,   good pulses Psych-affect is appropriate, oriented x3 Neuro-no new focal deficits, no tremors  MSK--left upper extremity AV fistula (brachiocephalic)---with  bruit and thrill   Data Review   CBC w Diff:  Lab Results  Component Value Date   WBC 5.8 04/28/2020   HGB 10.2 (L) 04/28/2020   HCT 33.4 (L) 04/28/2020   PLT 156 04/28/2020   LYMPHOPCT 20 04/28/2020   MONOPCT 12 04/28/2020   EOSPCT 1 04/28/2020   BASOPCT  1 04/28/2020    CMP:  Lab Results  Component Value Date   NA 136 04/28/2020   K 3.2 (L) 04/28/2020   CL 97 (L) 04/28/2020   CO2 27 04/28/2020   BUN 18 04/28/2020   CREATININE 4.30 (H) 04/28/2020   PROT 6.7 04/28/2020   ALBUMIN 3.4 (L) 04/28/2020   BILITOT 0.6 04/28/2020   ALKPHOS 88 04/28/2020   AST 19 04/28/2020   ALT 12 04/28/2020  .   Total Discharge time is about 33 minutes  Roxan Hockey M.D on 04/29/2020 at 1:48 PM  Go to [email or web address] -  for contact info  [facility] - Office  [phone]

## 2020-04-29 NOTE — TOC Transition Note (Signed)
Transition of Care [facility]) - CM/SW Discharge Note   Patient Details  Name: JDYN PARKERSON MRN: [id number] Date of Birth: 1940/03/22  Transition of Care [facility] CM/SW Contact:  Salome Arnt, LCSW Phone Number: 04/29/2020, 2:05 PM   Clinical Narrative:  Pt d/c back to [facility]. Pt's wife and facility aware and agreeable. Pt will transport via [facility]. D/C summary sent on hub. COVID negative on 04/28/20. RN given number to call report.      Final next level of care: Skilled Nursing Facility Barriers to Discharge: Barriers Resolved   Patient Goals and CMS Choice Patient states their goals for this hospitalization and ongoing recovery are:: return to [facility] SNF   Choice offered to / list presented to : Spouse  Discharge Placement              Patient chooses bed at: Other - please specify in the comment section below: ([facility]) Patient to be transferred to facility by: [facility] Name of family member notified: Horris Latino- wife Patient and family notified of of transfer: 04/29/20  Discharge Plan and Services In-house Referral: Clinical Social Work   Post Acute Care Choice: Resumption of Svcs/PTA Provider,Nursing Home          DME Arranged: N/A DME Agency: NA                  Social Determinants of Health (SDOH) Interventions     Readmission Risk Interventions No flowsheet data found.

## 2020-04-29 NOTE — Plan of Care (Signed)
  Problem: Education: Goal: Knowledge of General Education information will improve Description: Including pain rating scale, medication(s)/side effects and non-pharmacologic comfort measures 04/29/2020 1311 by Adam Phenix, RN Outcome: Adequate for Discharge 04/29/2020 0726 by Adam Phenix, RN Outcome: Progressing   Problem: Health Behavior/Discharge Planning: Goal: Ability to manage health-related needs will improve 04/29/2020 1311 by Adam Phenix, RN Outcome: Adequate for Discharge 04/29/2020 0726 by Adam Phenix, RN Outcome: Progressing   Problem: Clinical Measurements: Goal: Ability to maintain clinical measurements within normal limits will improve 04/29/2020 1311 by Adam Phenix, RN Outcome: Adequate for Discharge 04/29/2020 0726 by Adam Phenix, RN Outcome: Progressing Goal: Will remain free from infection 04/29/2020 1311 by Adam Phenix, RN Outcome: Adequate for Discharge 04/29/2020 0726 by Adam Phenix, RN Outcome: Progressing Goal: Diagnostic test results will improve 04/29/2020 1311 by Adam Phenix, RN Outcome: Adequate for Discharge 04/29/2020 0726 by Adam Phenix, RN Outcome: Progressing Goal: Respiratory complications will improve 04/29/2020 1311 by Adam Phenix, RN Outcome: Adequate for Discharge 04/29/2020 0726 by Adam Phenix, RN Outcome: Progressing Goal: Cardiovascular complication will be avoided 04/29/2020 1311 by Adam Phenix, RN Outcome: Adequate for Discharge 04/29/2020 0726 by Adam Phenix, RN Outcome: Progressing   Problem: Activity: Goal: Risk for activity intolerance will decrease 04/29/2020 1311 by Adam Phenix, RN Outcome: Adequate for Discharge 04/29/2020 0726 by Adam Phenix, RN Outcome: Progressing   Problem: Nutrition: Goal: Adequate nutrition will be maintained 04/29/2020 1311 by Adam Phenix, RN Outcome:  Adequate for Discharge 04/29/2020 0726 by Adam Phenix, RN Outcome: Progressing   Problem: Coping: Goal: Level of anxiety will decrease 04/29/2020 1311 by Adam Phenix, RN Outcome: Adequate for Discharge 04/29/2020 0726 by Adam Phenix, RN Outcome: Progressing   Problem: Elimination: Goal: Will not experience complications related to bowel motility 04/29/2020 1311 by Adam Phenix, RN Outcome: Adequate for Discharge 04/29/2020 0726 by Adam Phenix, RN Outcome: Progressing Goal: Will not experience complications related to urinary retention 04/29/2020 1311 by Adam Phenix, RN Outcome: Adequate for Discharge 04/29/2020 0726 by Adam Phenix, RN Outcome: Progressing   Problem: Pain Managment: Goal: General experience of comfort will improve 04/29/2020 1311 by Adam Phenix, RN Outcome: Adequate for Discharge 04/29/2020 0726 by Adam Phenix, RN Outcome: Progressing   Problem: Safety: Goal: Ability to remain free from injury will improve 04/29/2020 1311 by Adam Phenix, RN Outcome: Adequate for Discharge 04/29/2020 0726 by Adam Phenix, RN Outcome: Progressing   Problem: Skin Integrity: Goal: Risk for impaired skin integrity will decrease 04/29/2020 1311 by Adam Phenix, RN Outcome: Adequate for Discharge 04/29/2020 0726 by Adam Phenix, RN Outcome: Progressing

## 2020-04-29 NOTE — Plan of Care (Signed)

## 2020-04-29 NOTE — Consult Note (Signed)
[facility] Renal Consultation Note    Indication for Consultation:  Management of ESRD/hemodialysis; anemia, hypertension/volume and secondary hyperparathyroidism  HPI: Taylor Willis is a 80 y.o. male with a PMH significant for HTN, HDL, GERD, gout, h/o prostate CA, new ESRD started during hospitalization at [facility] 04/04/20 and discharged to [place] with outpatient dialysis at [facility] on TTS schedule.  He was brought to [facility] from SNF on 04/28/20 with increased confusion.  Per his wife who was at the bedside he was ok on Saturday but was very somnolent and confused yesterday.  She thinks he is a little better today.  She denies any new medications or pain meds.  No fevers, chills.  Pt remains lethargic and confused this am.  We were consulted to provide dialysis during his hospitalization.    HPI obtained from discussions with his wife and attending physician given his AMS.  Past Medical History:  Diagnosis Date  . Arthritis    gout  . Asthma    has rescue inhaler  . Cancer [facility])    prostate  . Chronic kidney disease   . GERD (gastroesophageal reflux disease)   . H/O peptic ulcer 1970's  . Hyperlipidemia   . Hypertension   . Ureteral stone   . Wears dentures   . Wears glasses    Past Surgical History:  Procedure Laterality Date  . AV FISTULA PLACEMENT Left 02/15/2019   Procedure: ARTERIOVENOUS (AV) FISTULA CREATION LEFT ARM;  Surgeon: Rosetta Posner, MD;  Location: [place];  Service: Vascular;  Laterality: Left;  . CATARACT EXTRACTION W/ INTRAOCULAR LENS  IMPLANT, BILATERAL    . CYSTOSCOPY/RETROGRADE/URETEROSCOPY/STONE EXTRACTION WITH BASKET Right 05/23/2012   Procedure: CYSTOSCOPY/RETROGRADE/URETEROSCOPY/STONE EXTRACTION WITH BASKET;  Surgeon: Bernestine Amass, MD;  Location[facility];  Service: Urology;  Laterality: Right;  . FRACTURE SURGERY Right    as a child  . HERNIA REPAIR    . HOLMIUM LASER APPLICATION Right 2/44/0102   Procedure:  HOLMIUM LASER APPLICATION;  Surgeon: Bernestine Amass, MD;  Location: [facility];  Service: Urology;  Laterality: Right;  . INSERTION OF DIALYSIS CATHETER Right 04/08/2020   Procedure: INSERTION OF TUNNELED DIALYSIS CATHETER;  Surgeon: Virl Cagey, MD;  Location: [facility];  Service: General;  Laterality: Right;  . LIGATION OF ARTERIOVENOUS  FISTULA Left 09/12/2019   Procedure: SIDE BRANCH LIGATION  OF LEFT ARM ARTERIOVENOUS FISTULA;  Surgeon: Elam Dutch, MD;  Location: [place];  Service: Vascular;  Laterality: Left;  Marland Kitchen MULTIPLE TOOTH EXTRACTIONS    . REVISON OF ARTERIOVENOUS FISTULA Left 09/12/2019   Procedure: REVISON OF LEFT ARM ARTERIOVENOUS FISTULA;  Surgeon: Elam Dutch, MD;  Location: [place];  Service: Vascular;  Laterality: Left;  . ROBOT ASSISTED LAPAROSCOPIC RADICAL PROSTATECTOMY  2011  . TONSILLECTOMY     Family History:   History reviewed. No pertinent family history. Social History:  reports that he quit smoking about 37 years ago. His smoking use included cigarettes. He has never used smokeless tobacco. He reports previous alcohol use of about 1.0 standard drink of alcohol per week. He reports that he does not use drugs. Allergies  Allergen Reactions  . Aspirin Other (See Comments)    GI irritation   . Hydrochlorothiazide     Dizziness and unable to walk  . Ibuprofen Other (See Comments)    GI irritation    Prior to Admission medications   Medication Sig Start Date End Date Taking? Authorizing  Provider  albuterol (PROVENTIL HFA;VENTOLIN HFA) 108 (90 BASE) MCG/ACT inhaler Inhale 2 puffs into the lungs every 6 (six) hours as needed for wheezing.   Yes [provider]  amLODipine (NORVASC) 5 MG tablet Take 5 mg by mouth daily.   Yes [provider]  calcium acetate (PHOSLO) 667 MG capsule Take 667 mg by mouth 2 (two) times daily with a meal.   Yes [provider]  carvedilol (COREG) 3.125 MG tablet Take 3.125 mg by mouth See  admin instructions. Take 1 tablet on Dialysis days (Tues, Thurs, and Sat), On Non-dialysis days take twice a day.   Yes [provider]  Cholecalciferol 25 MCG (1000 UT) tablet Take 1,000 Units by mouth daily.    Yes [provider]  Cinnamon 500 MG capsule Take 500 mg by mouth daily.   Yes [provider]  FEROSUL 325 (65 Fe) MG tablet Take 325 mg by mouth in the morning, at noon, and at bedtime. 03/08/20  Yes [provider]  valACYclovir (VALTREX) 1000 MG tablet Take 1 g by mouth 3 (three) times daily.   Yes [provider]  vitamin B-12 (CYANOCOBALAMIN) 1000 MCG tablet Take 1,000 mcg by mouth daily.   Yes [provider]   Current Facility-Administered Medications  Medication Dose Route Frequency Provider Last Rate Last Admin  . albuterol (VENTOLIN HFA) 108 (90 Base) MCG/ACT inhaler 2 puff  2 puff Inhalation Q6H PRN Adefeso, Oladapo, DO      . calcium acetate (PHOSLO) capsule 667 mg  667 mg Oral BID WC Adefeso, Oladapo, DO      . carvedilol (COREG) tablet 3.125 mg  3.125 mg Oral BID WC Adefeso, Oladapo, DO      . Chlorhexidine Gluconate Cloth 2 % PADS 6 each  6 each Topical Q0600 Donato Heinz, MD      . cholecalciferol (VITAMIN D3) tablet 1,000 Units  1,000 Units Oral Daily Adefeso, Oladapo, DO      . heparin injection 5,000 Units  5,000 Units Subcutaneous Q8H Adefeso, Oladapo, DO   5,000 Units at 04/29/20 0540  . vitamin B-12 (CYANOCOBALAMIN) tablet 1,000 mcg  1,000 mcg Oral Daily Bernadette Hoit, DO       Labs: Basic Metabolic Panel: Recent Labs  Lab 04/28/20 2330 04/29/20 0142  NA 136  --   K 3.2*  --   CL 97*  --   CO2 27  --   GLUCOSE 135*  --   BUN 18  --   CREATININE 4.30*  --   CALCIUM 8.5*  --   PHOS  --  2.0*   Liver Function Tests: Recent Labs  Lab 04/28/20 2330  AST 19  ALT 12  ALKPHOS 88  BILITOT 0.6  PROT 6.7  ALBUMIN 3.4*   No results for input(s): LIPASE, AMYLASE in the last 168 hours. Recent  Labs  Lab 04/28/20 2330  AMMONIA 18   CBC: Recent Labs  Lab 04/28/20 2330  WBC 5.8  NEUTROABS 3.8  HGB 10.2*  HCT 33.4*  MCV 102.8*  PLT 156   Cardiac Enzymes: No results for input(s): CKTOTAL, CKMB, CKMBINDEX, TROPONINI in the last 168 hours. CBG: No results for input(s): GLUCAP in the last 168 hours. Iron Studies: No results for input(s): IRON, TIBC, TRANSFERRIN, FERRITIN in the last 72 hours. Studies/Results: CT HEAD WO CONTRAST  Result Date: 04/29/2020 CLINICAL DATA:  Altered mental status, end-stage renal disease EXAM: CT HEAD WITHOUT CONTRAST TECHNIQUE: Contiguous axial images were obtained from the  base of the skull through the vertex without intravenous contrast. COMPARISON:  None. FINDINGS: Brain: Normal anatomic configuration. Parenchymal volume loss is commensurate with the patient's age. Moderate periventricular white matter changes are present likely reflecting the sequela of small vessel ischemia. No abnormal intra or extra-axial mass lesion or fluid collection. No abnormal mass effect or midline shift. No evidence of acute intracranial hemorrhage or infarct. Ventricular size is normal. Cerebellum unremarkable. Vascular: No asymmetric hyperdense vasculature at the skull base. Skull: Intact Sinuses/Orbits: Paranasal sinuses are clear. Orbits are unremarkable. Other: Mastoid air cells and middle ear cavities are clear. IMPRESSION: No acute intracranial abnormality.  Moderate senescent change. Electronically Signed   By: Fidela Salisbury MD   On: 04/29/2020 00:55   DG Chest Port 1 View  Result Date: 04/29/2020 CLINICAL DATA:  Altered mental status EXAM: PORTABLE CHEST 1 VIEW COMPARISON:  04/08/2020 FINDINGS: Cardiac shadow is stable. Dialysis catheter is again noted and stable. Previously seen changes of congestive failure have resolved in the interval. Minimal left pleural effusion is noted. IMPRESSION: Small left pleural effusion. Previously seen congestive failure has  resolved. Electronically Signed   By: Inez Catalina M.D.   On: 04/29/2020 01:01    ROS: Review of systems not obtained due to patient factors. Physical Exam: Vitals:   04/29/20 0330 04/29/20 0430 04/29/20 0534 04/29/20 0815  BP: 121/69 131/77 132/75 (!) 146/41  Pulse: 82  81 73  Resp: 16 (!) 22 18   Temp:   98.4 F (36.9 C)   TempSrc:   Oral   SpO2: 97%  96%   Weight:   81.8 kg   Height:   5\' 11"  (1.803 m)       Weight change:   Intake/Output Summary (Last 24 hours) at 04/29/2020 0930 Last data filed at 04/29/2020 0900 Gross per 24 hour  Intake 0 ml  Output 0 ml  Net 0 ml   BP (!) 146/41 (BP Location: Left Leg)   Pulse 73   Temp 98.4 F (36.9 C) (Oral)   Resp 18   Ht 5\' 11"  (1.803 m)   Wt 81.8 kg   SpO2 96%   BMI 25.15 kg/m  General appearance: delirious, fatigued and no distress Head: Normocephalic, without obvious abnormality, atraumatic Resp: clear to auscultation bilaterally Cardio: regular rate and rhythm and no rub GI: soft, non-tender; bowel sounds normal; no masses,  no organomegaly Extremities: edema trace presacral edema and LUE AVF +T/B Dialysis Access: LUE AVF  Dialysis Orders: Center: [facility]  on TTS .  Unable to obtain outpatient HD records due to phone issues at his clinc.  Assessment/Plan: 1.  Altered mental status - unclear etiology.  CT of head negative, afebrile and not hypoxic.  Workup per primary svc.  BUN only 18 so do not think this is uremia (has been receiving dialysis regularly per wife without issues). 2.  ESRD -  Continue with HD on TTS schedule 3.  Hypertension/volume  - stable and UF as tolerated with HD 4.  Anemia  - stable 5.  Metabolic bone disease -  Hold binders due to hypophosphatemia. 6.  Nutrition -  Renal diet when able to take po  Donetta Potts, MD [place] Pager [phone] 04/29/2020, 9:30 AM

## 2020-04-29 NOTE — Discharge Instructions (Signed)
1)Avoid ibuprofen/Advil/Aleve/Motrin/Goody Powders/Naproxen/BC powders/Meloxicam/Diclofenac/Indomethacin and other Nonsteroidal anti-inflammatory medications as these will make you more likely to bleed and can cause stomach ulcers, can also cause Kidney problems.   2)Very low-salt diet advised 3)Weigh yourself daily, call if you gain more than 3 pounds in 1 day or more than 5 pounds in 1 week as your hemodialysis dry weight may need to be adjusted 4)Limit your Fluid  intake to no more than 50 ounces (1.5 Liters) per day 5) continue outpatient hemodialysis on a Tuesday Thursday Saturday schedule at [facility] in [place] 6)Stop Amlodipine okay to continue carvedilol/Coreg for BP 7) May use Zyprexa/olanzapine 2.5 mg at bedtime as needed for agitation/hallucinations 8) Please follow-up with vascular surgeon Dr. Donnetta Hutching --for left brachiocephalic fistula fistulogram as previously advised

## 2020-04-29 NOTE — H&P (Signed)
History and Physical  Taylor Willis TJQ:[id number] DOB: Sep 05, 1940 DOA: 04/28/2020  Referring physician: Orpah Greek, MD PCP: Deloria Lair., MD  Patient coming from: SNF  Chief Complaint: Altered mental status  HPI: Taylor Willis is a 80 y.o. male with medical history significant for prostate cancer, history of asthma, ESRD on HD (TTS), hyperlipidemia, hypertension and gout who presents to the emergency department from local SNF due to altered mental status.  Patient was unable to provide history due to altered mental status, history was obtained from ED physician and wife at bedside.  Per report, wife states that patient was at his baseline of functioning on Saturday evening when she saw him, but on returning on Sunday morning, he was confused, and was seeing things that were not there and this resulted to activating EMS and patient was taken to the ED for further evaluation and management. Patient was admitted from 3/3-3/10 due to acute respiratory failure with hypoxia, new ESRD and acute metabolic encephalopathy.  ED Course:  In the emergency department, he was hemodynamically stable.  Work-up in the ED showed macrocytic anemia, hypokalemia, BUN/creatinine 18/4.30 (patient's creatinine has ranged within 6.4-8.7 within last 3 weeks), eGFR 13 (was 6-8 within last 3 weeks).  High-sensitivity troponin x2-58 > 55, ammonia 18.  Lactic acid 1.1.  Chest x-ray shows small left pleural effusion.  Previously seen congestive failure has resolved.  CT of head without contrast showed no acute intracranial abnormality.  Urinalysis was not obtainable due to patient not forming any urine (it was reported that patient still urinates).  Hospitalist was asked to admit patient for further evaluation and management.   Review of Systems: This cannot be obtained at this time due to patient's current condition  Past Medical History:  Diagnosis Date  . Arthritis    gout  . Asthma    has rescue inhaler   . Cancer [facility])    prostate  . Chronic kidney disease   . GERD (gastroesophageal reflux disease)   . H/O peptic ulcer 1970's  . Hyperlipidemia   . Hypertension   . Ureteral stone   . Wears dentures   . Wears glasses    Past Surgical History:  Procedure Laterality Date  . AV FISTULA PLACEMENT Left 02/15/2019   Procedure: ARTERIOVENOUS (AV) FISTULA CREATION LEFT ARM;  Surgeon: Rosetta Posner, MD;  Location: [place];  Service: Vascular;  Laterality: Left;  . CATARACT EXTRACTION W/ INTRAOCULAR LENS  IMPLANT, BILATERAL    . CYSTOSCOPY/RETROGRADE/URETEROSCOPY/STONE EXTRACTION WITH BASKET Right 05/23/2012   Procedure: CYSTOSCOPY/RETROGRADE/URETEROSCOPY/STONE EXTRACTION WITH BASKET;  Surgeon: Bernestine Amass, MD;  Location: [facility];  Service: Urology;  Laterality: Right;  . FRACTURE SURGERY Right    as a child  . HERNIA REPAIR    . HOLMIUM LASER APPLICATION Right 7/62/2633   Procedure: HOLMIUM LASER APPLICATION;  Surgeon: Bernestine Amass, MD;  Location: [facility];  Service: Urology;  Laterality: Right;  . INSERTION OF DIALYSIS CATHETER Right 04/08/2020   Procedure: INSERTION OF TUNNELED DIALYSIS CATHETER;  Surgeon: Virl Cagey, MD;  Location: [facility];  Service: General;  Laterality: Right;  . LIGATION OF ARTERIOVENOUS  FISTULA Left 09/12/2019   Procedure: SIDE BRANCH LIGATION  OF LEFT ARM ARTERIOVENOUS FISTULA;  Surgeon: Elam Dutch, MD;  Location: [place];  Service: Vascular;  Laterality: Left;  Marland Kitchen MULTIPLE TOOTH EXTRACTIONS    . REVISON OF ARTERIOVENOUS FISTULA Left 09/12/2019   Procedure: REVISON OF LEFT ARM ARTERIOVENOUS FISTULA;  Surgeon: Elam Dutch, MD;  Location: [place];  Service: Vascular;  Laterality: Left;  . ROBOT ASSISTED LAPAROSCOPIC RADICAL PROSTATECTOMY  2011  . TONSILLECTOMY      Social History:  reports that he quit smoking about 37 years ago. His smoking use included cigarettes. He has never used smokeless tobacco. He reports  previous alcohol use of about 1.0 standard drink of alcohol per week. He reports that he does not use drugs.   Allergies  Allergen Reactions  . Aspirin Other (See Comments)    GI irritation   . Hydrochlorothiazide     Dizziness and unable to walk  . Ibuprofen Other (See Comments)    GI irritation     History reviewed. No pertinent family history.    Prior to Admission medications   Medication Sig Start Date End Date Taking? Authorizing Provider  albuterol (PROVENTIL HFA;VENTOLIN HFA) 108 (90 BASE) MCG/ACT inhaler Inhale 2 puffs into the lungs every 6 (six) hours as needed for wheezing.    [provider]  calcium acetate (PHOSLO) 667 MG capsule Take 667 mg by mouth 2 (two) times daily with a meal.    [provider]  carvedilol (COREG) 3.125 MG tablet Take 3.125 mg by mouth 2 (two) times daily with a meal.    [provider]  Cholecalciferol 25 MCG (1000 UT) tablet Take 1,000 Units by mouth daily.     [provider]  Cinnamon 500 MG capsule Take 500 mg by mouth daily.    [provider]  FEROSUL 325 (65 Fe) MG tablet Take 325 mg by mouth in the morning, at noon, and at bedtime. 03/08/20   [provider]  vitamin B-12 (CYANOCOBALAMIN) 1000 MCG tablet Take 1,000 mcg by mouth daily.    [provider]    Physical Exam: BP 131/77   Pulse 82   Temp 97.7 F (36.5 C) (Oral)   Resp (!) 22   Ht _0  (1.803 m)   Wt 94.3 kg   SpO2 97%   BMI 29.01 kg/m   . General: 80 y.o. year-old male well developed, well nourished, confused but in no acute distress.  Alert but not oriented. Marland Kitchen HEENT: NCAT, EOMI . Neck: Supple, trachea medial . Cardiovascular: Regular rate and rhythm with no rubs or gallops.  No thyromegaly or JVD noted. 2/4 pulses in all 4 extremities. Marland Kitchen Respiratory: Clear to auscultation with no wheezes or rales. Good inspiratory effort. . Abdomen: Soft nontender nondistended with normal bowel sounds x4  quadrants. . Muskuloskeletal: Bilateral lower extremity edema.  Fistula site noted in left arm.  Port-A-Cath site noted.  No cyanosis or clubbing. . Neuro: No focal neurologic deficit, sensation, reflexes intact . Skin: No ulcerative lesions noted or rashes Psychiatry: This cannot be assessed at this time due to patient's current condition         Labs on Admission:  Basic Metabolic Panel: Recent Labs  Lab 04/28/20 2330  NA 136  K 3.2*  CL 97*  CO2 27  GLUCOSE 135*  BUN 18  CREATININE 4.30*  CALCIUM 8.5*   Liver Function Tests: Recent Labs  Lab 04/28/20 2330  AST 19  ALT 12  ALKPHOS 88  BILITOT 0.6  PROT 6.7  ALBUMIN 3.4*   No results for input(s): LIPASE, AMYLASE in the last 168 hours. Recent Labs  Lab 04/28/20 2330  AMMONIA 18   CBC: Recent Labs  Lab 04/28/20 2330  WBC 5.8  NEUTROABS 3.8  HGB 10.2*  HCT 33.4*  MCV 102.8*  PLT 156   Cardiac Enzymes: No results for input(s): CKTOTAL, CKMB, CKMBINDEX, TROPONINI in the last 168 hours.  BNP (last 3 results) No results for input(s): BNP in the last 8760 hours.  ProBNP (last 3 results) No results for input(s): PROBNP in the last 8760 hours.  CBG: No results for input(s): GLUCAP in the last 168 hours.  Radiological Exams on Admission: CT HEAD WO CONTRAST  Result Date: 04/29/2020 CLINICAL DATA:  Altered mental status, end-stage renal disease EXAM: CT HEAD WITHOUT CONTRAST TECHNIQUE: Contiguous axial images were obtained from the base of the skull through the vertex without intravenous contrast. COMPARISON:  None. FINDINGS: Brain: Normal anatomic configuration. Parenchymal volume loss is commensurate with the patient's age. Moderate periventricular white matter changes are present likely reflecting the sequela of small vessel ischemia. No abnormal intra or extra-axial mass lesion or fluid collection. No abnormal mass effect or midline shift. No evidence of acute intracranial hemorrhage or infarct. Ventricular  size is normal. Cerebellum unremarkable. Vascular: No asymmetric hyperdense vasculature at the skull base. Skull: Intact Sinuses/Orbits: Paranasal sinuses are clear. Orbits are unremarkable. Other: Mastoid air cells and middle ear cavities are clear. IMPRESSION: No acute intracranial abnormality.  Moderate senescent change. Electronically Signed   By: Fidela Salisbury MD   On: 04/29/2020 00:55   DG Chest Port 1 View  Result Date: 04/29/2020 CLINICAL DATA:  Altered mental status EXAM: PORTABLE CHEST 1 VIEW COMPARISON:  04/08/2020 FINDINGS: Cardiac shadow is stable. Dialysis catheter is again noted and stable. Previously seen changes of congestive failure have resolved in the interval. Minimal left pleural effusion is noted. IMPRESSION: Small left pleural effusion. Previously seen congestive failure has resolved. Electronically Signed   By: Inez Catalina M.D.   On: 04/29/2020 01:01    EKG: I independently viewed the EKG done and my findings are as followed: No EKG was done in the ED  Assessment/Plan Present on Admission: . Acute metabolic encephalopathy . ESRD (end stage renal disease) ([place]) . Benign essential hypertension  Active Problems:   History of asthma   ESRD (end stage renal disease) (HCC)   Acute metabolic encephalopathy   Benign essential hypertension   Hypoalbuminemia due to protein-calorie malnutrition (HCC)   Macrocytic anemia   Elevated troponin   Hypokalemia   Secondary hyperparathyroidism ([place])  Acute metabolic encephalopathy possibly secondary to multifactorial Patient presented with similar altered mental status during last admission Continue fall precaution, neurochecks, aspiration precaution Avoid any CNS acting drugs at this time Ammonia and lactic acid levels when normal Urinalysis and urine drug screen pending (patient was reported to still form some urine)  ESRD on HD (TTS) Last HD was on Saturday (3/26) Nephrology consulted for maintenance HD  Hypoalbuminemia  possibly due to mild protein calorie malnutrition Albumin 3.4, patient will be provided with protein supplement when able to tolerate oral intake  Hypokalemia K+ 3.2, this will be replenished  Macrocytic anemia MCV 102.8, H/H 10.2/33.4 Vitamin B12 level recheck about 2 weeks ago was 1,374 Folate level checked 2 weeks ago was 13.7 Continue vitamin B12 per home regimen  Secondary hyperparathyroidism Continue PhosLo when patient is able to tolerate oral intake  Essential hypertension Continue Coreg when patient is able to tolerate oral intake  Elevated troponin possibly due to type II demand ischemia Troponin flattened out-58 > 55  DVT prophylaxis: Heparin subcu  Code Status: Full code  Family Communication: Wife at bedside (all questions answered to satisfaction)  Disposition Plan:  Patient  is from:                        home Anticipated DC to:                   SNF or family members home Anticipated DC date:               2-3 days Anticipated DC barriers:        patient is unstable to be discharged at this time due to altered mental status  Consults called: Nephrology  Admission status: Inpatient  Bernadette Hoit MD [facility]  04/29/2020, 5:26 AM

## 2020-04-29 NOTE — TOC Initial Note (Addendum)
Transition of Care [facility]) - Initial/Assessment Note    Patient Details  Name: Taylor Willis MRN: [id number] Date of Birth: Jun 22, 1940  Transition of [facility]) CM/SW Contact:    Salome Arnt, [place] Phone Number: 04/29/2020, 9:08 AM  Clinical Narrative:  Pt admitted due to acute metabolic encephalopathy. He has been a resident at [facility] for several weeks for rehab. LCSW spoke with pt's wife who confirms plan to return to [facility] when medically stable. Pt is on dialysis TTS 2nd shift at [facility]. Per Mardene Celeste at [facility], pt is okay to return and no FL2 needed. Pt will need COVID test within 48 hours of discharge. TOC will continue to follow.                   Expected Discharge Plan: Skilled Nursing Facility Barriers to Discharge: Continued Medical Work up   Patient Goals and CMS Choice Patient states their goals for this hospitalization and ongoing recovery are:: return to [facility] SNF   Choice offered to / list presented to : Spouse  Expected Discharge Plan and Services Expected Discharge Plan: [place] In-house Referral: Clinical Social Work   Post Acute Care Choice: Resumption of Svcs/PTA Provider,Nursing Home Living arrangements for the past 2 months: [place]                 DME Arranged: N/A                    Prior Living Arrangements/Services Living arrangements for the past 2 months: [place] Lives with:: Facility Resident   Do you feel safe going back to the place where you live?: Yes        Care giver support system in place?: Yes (comment)   Criminal Activity/Legal Involvement Pertinent to Current Situation/Hospitalization: No - Comment as needed  Activities of Daily Living Home Assistive Devices/Equipment: Wheelchair ADL Screening (condition at time of admission) Patient's cognitive ability adequate to safely complete daily activities?: Yes Is the patient deaf or have  difficulty hearing?: No Does the patient have difficulty seeing, even when wearing glasses/contacts?: No Does the patient have difficulty concentrating, remembering, or making decisions?: No Patient able to express need for assistance with ADLs?: No Does the patient have difficulty dressing or bathing?: No Independently performs ADLs?: No Communication: Independent Dressing (OT): Needs assistance Is this a change from baseline?: Pre-admission baseline Grooming: Needs assistance Is this a change from baseline?: Pre-admission baseline Feeding: Needs assistance Is this a change from baseline?: Pre-admission baseline Bathing: Needs assistance Is this a change from baseline?: Pre-admission baseline Toileting: Needs assistance Is this a change from baseline?: Pre-admission baseline In/Out Bed: Needs assistance Is this a change from baseline?: Pre-admission baseline Walks in Home: Needs assistance Is this a change from baseline?: Pre-admission baseline Does the patient have difficulty walking or climbing stairs?: No Weakness of Legs: Both Weakness of Arms/Hands: None  Permission Sought/Granted         Permission granted to share info w AGENCY: [facility] SNF  Permission granted to share info w Relationship: SNF     Emotional Assessment   Attitude/Demeanor/Rapport: Unable to Assess Affect (typically observed): Unable to Assess Orientation: : Oriented to Self Alcohol / Substance Use: Not Applicable Psych Involvement: No (comment)  Admission diagnosis:  Altered mental status, unspecified altered mental status type [B15.17] Acute metabolic encephalopathy [O16.07] Patient Active Problem List   Diagnosis Date Noted  . Hypoalbuminemia due to protein-calorie malnutrition ([place]) 04/29/2020  .  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  . Elevated troponin 04/29/2020  . Hypokalemia 04/29/2020  . Secondary hyperparathyroidism ([place]) 04/29/2020  . BMI 31.0-31.9,adult 04/23/2020  . ESRD (end stage  renal disease) ([place]) 04/10/2020  . Acute metabolic encephalopathy 56/97/9480  . Hyponatremia 04/10/2020  . Acute respiratory failure with hypoxia ([place]) 04/04/2020  . Pulmonary edema 04/04/2020  . GERD (gastroesophageal reflux disease) 04/04/2020  . Prostate cancer ([place]) 04/04/2020  . History of asthma 04/04/2020  . CKD (chronic kidney disease) stage 5, GFR less than 15 ml/min (HCC) 10/12/2019  . 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 Hyperkalemia 11/24/2018  . Age-related nuclear cataract of both eyes 03/04/2018  . Risk for falls 03/04/2018  . Benign essential hypertension 09/08/2017  . Disturbance in sleep behavior 09/08/2017  . History of adenomatous polyp of colon 09/08/2017  . Hyperuricemia 09/08/2017  . Mild intermittent asthma, uncomplicated 16/55/3748  . Personal history of malignant neoplasm of prostate 09/08/2017  . Urinary incontinence 09/08/2017  . Ureteral calculus 05/23/2012   PCP:  Deloria Lair., MD Pharmacy:  No Pharmacies Listed    Social Determinants of Health (SDOH) Interventions    Readmission Risk Interventions No flowsheet data found.

## 2020-04-29 NOTE — Progress Notes (Signed)
PHARMACIST - PHYSICIAN ORDER COMMUNICATION  CONCERNING: P&T Medication Policy on Herbal Medications  DESCRIPTION:  This patient's order for:  cinnamon  has been noted.  This product(s) is classified as an "herbal" or natural product. Due to a lack of definitive safety studies or FDA approval, nonstandard manufacturing practices, plus the potential risk of unknown drug-drug interactions while on inpatient medications, the Pharmacy and Therapeutics Committee does not permit the use of "herbal" or natural products of this type within [facility].   ACTION TAKEN: The pharmacy department is unable to verify this order at this time and your patient has been informed of this safety policy. Please reevaluate patient's clinical condition at discharge and address if the herbal or natural product(s) should be resumed at that time.   Dolly Rias RPh 04/29/2020, 5:32 AM

## 2020-04-29 NOTE — ED Notes (Signed)
Pt to CT

## 2020-04-30 ENCOUNTER — Emergency Department (HOSPITAL_COMMUNITY)
Admission: EM | Admit: 2020-04-30 | Discharge: 2020-04-30 | Disposition: A | Discharge status: Home / Self Care | Payer: Medicare Other | Attending: Emergency Medicine | Admitting: Emergency Medicine

## 2020-04-30 ENCOUNTER — Other Ambulatory Visit: Payer: Self-pay

## 2020-04-30 ENCOUNTER — Encounter (HOSPITAL_COMMUNITY): Payer: Self-pay

## 2020-04-30 DIAGNOSIS — I120 Hypertensive chronic kidney disease with stage 5 chronic kidney disease or end stage renal disease: Secondary | ICD-10-CM | POA: Diagnosis not present

## 2020-04-30 DIAGNOSIS — J4520 Mild intermittent asthma, uncomplicated: Secondary | ICD-10-CM | POA: Diagnosis not present

## 2020-04-30 DIAGNOSIS — Z79899 Other long term (current) drug therapy: Secondary | ICD-10-CM | POA: Insufficient documentation

## 2020-04-30 DIAGNOSIS — N186 End stage renal disease: Secondary | ICD-10-CM | POA: Diagnosis not present

## 2020-04-30 DIAGNOSIS — R4182 Altered mental status, unspecified: Secondary | ICD-10-CM | POA: Diagnosis not present

## 2020-04-30 DIAGNOSIS — R451 Restlessness and agitation: Secondary | ICD-10-CM | POA: Insufficient documentation

## 2020-04-30 DIAGNOSIS — Z87891 Personal history of nicotine dependence: Secondary | ICD-10-CM | POA: Diagnosis not present

## 2020-04-30 DIAGNOSIS — Z992 Dependence on renal dialysis: Secondary | ICD-10-CM | POA: Insufficient documentation

## 2020-04-30 DIAGNOSIS — R441 Visual hallucinations: Secondary | ICD-10-CM | POA: Insufficient documentation

## 2020-04-30 DIAGNOSIS — Z8546 Personal history of malignant neoplasm of prostate: Secondary | ICD-10-CM | POA: Diagnosis not present

## 2020-04-30 LAB — COMPREHENSIVE METABOLIC PANEL
ALT: 14 U/L (ref 0–44)
AST: 22 U/L (ref 15–41)
Albumin: 3.6 g/dL (ref 3.5–5.0)
Alkaline Phosphatase: 98 U/L (ref 38–126)
Anion gap: 15 (ref 5–15)
BUN: 30 mg/dL — ABNORMAL HIGH (ref 8–23)
CO2: 27 mmol/L (ref 22–32)
Calcium: 9 mg/dL (ref 8.9–10.3)
Chloride: 98 mmol/L (ref 98–111)
Creatinine, Ser: 6.05 mg/dL — ABNORMAL HIGH (ref 0.61–1.24)
GFR, Estimated: 9 mL/min — ABNORMAL LOW (ref >60)
Glucose, Bld: 98 mg/dL (ref 70–99)
Potassium: 3.4 mmol/L — ABNORMAL LOW (ref 3.5–5.1)
Sodium: 140 mmol/L (ref 135–145)
Total Bilirubin: 0.8 mg/dL (ref 0.3–1.2)
Total Protein: 7 g/dL (ref 6.5–8.1)

## 2020-04-30 LAB — CBG MONITORING, ED: Glucose-Capillary: 101 mg/dL — ABNORMAL HIGH (ref 70–99)

## 2020-04-30 LAB — CBC
HCT: 34 % — ABNORMAL LOW (ref 39.0–52.0)
Hemoglobin: 10.3 g/dL — ABNORMAL LOW (ref 13.0–17.0)
MCH: 31.2 pg (ref 26.0–34.0)
MCHC: 30.3 g/dL (ref 30.0–36.0)
MCV: 103 fL — ABNORMAL HIGH (ref 80.0–100.0)
Platelets: 168 10*3/uL (ref 150–400)
RBC: 3.3 MIL/uL — ABNORMAL LOW (ref 4.22–5.81)
RDW: 15.8 % — ABNORMAL HIGH (ref 11.5–15.5)
WBC: 6.6 10*3/uL (ref 4.0–10.5)
nRBC: 0 % (ref 0.0–0.2)

## 2020-04-30 LAB — URINE CULTURE: Culture: <10000 — AB

## 2020-04-30 LAB — TROPONIN I (HIGH SENSITIVITY)
Troponin I (High Sensitivity): 63 ng/L — ABNORMAL HIGH (ref <18)
Troponin I (High Sensitivity): 54 ng/L — ABNORMAL HIGH (ref <18)

## 2020-04-30 LAB — LACTIC ACID, PLASMA: Lactic Acid, Venous: 1.2 mmol/L (ref 0.5–1.9)

## 2020-04-30 MED ORDER — SODIUM CHLORIDE 0.9 % IV SOLN: 100.0000 mL | INTRAVENOUS | Status: DC | PRN

## 2020-04-30 MED ORDER — CHLORHEXIDINE GLUCONATE CLOTH 2 % EX PADS: 6.0000 | MEDICATED_PAD | Freq: Every day | CUTANEOUS | Status: DC

## 2020-04-30 MED ORDER — OLANZAPINE 10 MG IM SOLR
2.5000 mg | Freq: Once | INTRAMUSCULAR | Status: AC
  Administered 2020-04-30: 2.5 mg via INTRAMUSCULAR
  Filled 2020-04-30: qty 10

## 2020-04-30 MED ORDER — HEPARIN SODIUM (PORCINE) 1000 UNIT/ML DIALYSIS: 1000.0000 [IU] | INTRAMUSCULAR | Status: DC | PRN

## 2020-04-30 MED ORDER — SODIUM CHLORIDE 0.9 % IV SOLN
100.0000 mL | INTRAVENOUS | Status: DC | PRN
Start: 2020-04-30 — End: 2020-05-01

## 2020-04-30 MED ORDER — OLANZAPINE 5 MG PO TABS: 2.5000 mg | ORAL_TABLET | Freq: Once | ORAL | Status: DC

## 2020-04-30 MED ORDER — HALOPERIDOL LACTATE 5 MG/ML IJ SOLN
5.0000 mg | Freq: Once | INTRAMUSCULAR | Status: AC
  Administered 2020-04-30: 5 mg via INTRAMUSCULAR
  Filled 2020-04-30: qty 1

## 2020-04-30 NOTE — ED Provider Notes (Signed)
[facility] Provider Note   CSN: [id number] Arrival date & time: 04/30/20  1103     History Chief Complaint  Patient presents with  . Altered Mental Status    Taylor Willis is a 80 y.o. male who presents via EMS from his facility for altered mental status and agitation.  Patient was discharged yesterday following inpatient work-up for concern of the same, work-up was largely benign, altered mental status was felt to be most likely secondary to dementia.  Patient supposed to be dialyzed Tuesday, Thursday, and Saturday.  Patient was not dialyzed today prior to his arrival to the emergency department.  LEVEL 5 CAVEAT due to AMS.   I personally reviewed this patient's medical record.  He has history of GERD, prostate cancer, CKD, end-stage renal disease dialysis dependent on Tuesday/Thursday/Saturday, now with more recent history of recurrent altered mental status.  Was discharged yesterday, according to hospitalist notes patient was back at his neuropsychiatric baseline.  Work-up felt to be more likely secondary to dementia.  Date provided by the patient's wife was at the bedside, states that he has been explained hallucinations, responding to voices and seeing things that are not there.  He has become delirious, feels afraid that people around him are trying to hurt him to kill him.  She states he has never had problems with altered mental status prior to this month, has no history of dementia.  HPI     Past Medical History:  Diagnosis Date  . Arthritis    gout  . Asthma    has rescue inhaler  . Cancer [facility])    prostate  . Chronic kidney disease   . GERD (gastroesophageal reflux disease)   . H/O peptic ulcer 1970's  . Herpes 04/28/2020  . Hyperlipidemia   . Hypertension   . Ureteral stone   . Wears dentures   . Wears glasses     Patient Active Problem List   Diagnosis Date Noted  . Hypoalbuminemia due to protein-calorie malnutrition ([place]) 04/29/2020   . 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  . Elevated troponin 04/29/2020  . Hypokalemia 04/29/2020  . Secondary hyperparathyroidism ([place]) 04/29/2020  . BMI 31.0-31.9,adult 04/23/2020  . ESRD (end stage renal disease) ([place]) 04/10/2020  . Acute metabolic encephalopathy 40/98/1191  . Hyponatremia 04/10/2020  . Acute respiratory failure with hypoxia ([place]) 04/04/2020  . Pulmonary edema 04/04/2020  . GERD (gastroesophageal reflux disease) 04/04/2020  . Prostate cancer ([place]) 04/04/2020  . History of asthma 04/04/2020  . CKD (chronic kidney disease) stage 5, GFR less than 15 ml/min (HCC) 10/12/2019  . 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 Hyperkalemia 11/24/2018  . Age-related nuclear cataract of both eyes 03/04/2018  . Risk for falls 03/04/2018  . Benign essential hypertension 09/08/2017  . Disturbance in sleep behavior 09/08/2017  . History of adenomatous polyp of colon 09/08/2017  . Hyperuricemia 09/08/2017  . Mild intermittent asthma, uncomplicated 47/82/9562  . Personal history of malignant neoplasm of prostate 09/08/2017  . Urinary incontinence 09/08/2017  . Ureteral calculus 05/23/2012    Past Surgical History:  Procedure Laterality Date  . AV FISTULA PLACEMENT Left 02/15/2019   Procedure: ARTERIOVENOUS (AV) FISTULA CREATION LEFT ARM;  Surgeon: Rosetta Posner, MD;  Location: [facility];  Service: Vascular;  Laterality: Left;  . CATARACT EXTRACTION W/ INTRAOCULAR LENS  IMPLANT, BILATERAL    . CYSTOSCOPY/RETROGRADE/URETEROSCOPY/STONE EXTRACTION WITH BASKET Right 05/23/2012   Procedure: CYSTOSCOPY/RETROGRADE/URETEROSCOPY/STONE EXTRACTION WITH BASKET;  Surgeon: Bernestine Amass, MD;  Location: [facility];  Service: Urology;  Laterality: Right;  . FRACTURE SURGERY Right    as a child  . HERNIA REPAIR    . HOLMIUM LASER APPLICATION Right 9/32/3557   Procedure: HOLMIUM LASER APPLICATION;  Surgeon: Bernestine Amass, MD;  Location: [facility];  Service: Urology;   Laterality: Right;  . INSERTION OF DIALYSIS CATHETER Right 04/08/2020   Procedure: INSERTION OF TUNNELED DIALYSIS CATHETER;  Surgeon: Virl Cagey, MD;  Location: [facility];  Service: General;  Laterality: Right;  . LIGATION OF ARTERIOVENOUS  FISTULA Left 09/12/2019   Procedure: SIDE BRANCH LIGATION  OF LEFT ARM ARTERIOVENOUS FISTULA;  Surgeon: Elam Dutch, MD;  Location: [facility];  Service: Vascular;  Laterality: Left;  Marland Kitchen MULTIPLE TOOTH EXTRACTIONS    . REVISON OF ARTERIOVENOUS FISTULA Left 09/12/2019   Procedure: REVISON OF LEFT ARM ARTERIOVENOUS FISTULA;  Surgeon: Elam Dutch, MD;  Location: [facility];  Service: Vascular;  Laterality: Left;  . ROBOT ASSISTED LAPAROSCOPIC RADICAL PROSTATECTOMY  2011  . TONSILLECTOMY         No family history on file.  Social History   Tobacco Use  . Smoking status: Former Smoker    Types: Cigarettes    Quit date: 05/18/1982    Years since quitting: 37.9  . Smokeless tobacco: Never Used  Vaping Use  . Vaping Use: Never used  Substance Use Topics  . Alcohol use: Not Currently    Alcohol/week: 1.0 standard drink    Types: 1 Standard drinks or equivalent per week  . Drug use: Never    Home Medications Prior to Admission medications   Medication Sig Start Date End Date Taking? Authorizing Provider  albuterol (PROVENTIL HFA;VENTOLIN HFA) 108 (90 BASE) MCG/ACT inhaler Inhale 2 puffs into the lungs every 6 (six) hours as needed for wheezing.   Yes [provider]  calcium acetate (PHOSLO) 667 MG capsule Take 667 mg by mouth 2 (two) times daily with a meal.   Yes [provider]  carvedilol (COREG) 3.125 MG tablet Take 1 tablet (3.125 mg total) by mouth See admin instructions. Take 1 tablet in the evenings on Dialysis days (Tues, Thurs, and Sat), On Non-dialysis days take 1 tablet twice a day. 04/29/20  Yes Roxan Hockey, MD  Cholecalciferol 25 MCG (1000 UT) tablet Take 1,000 Units by mouth daily.    Yes [provider]   Cinnamon 500 MG capsule Take 500 mg by mouth daily.   Yes [provider]  FEROSUL 325 (65 Fe) MG tablet Take 325 mg by mouth in the morning, at noon, and at bedtime. 03/08/20  Yes [provider]  OLANZapine (ZYPREXA) 2.5 MG tablet Take 1 tablet (2.5 mg total) by mouth at bedtime as needed (Agitation/hallucinations). 04/29/20 04/29/21 Yes Emokpae, Courage, MD  vitamin B-12 (CYANOCOBALAMIN) 1000 MCG tablet Take 1,000 mcg by mouth daily.   Yes [provider]    Allergies    Aspirin, Hydrochlorothiazide, and Ibuprofen  Review of Systems   Review of Systems  Unable to perform ROS: Mental status change    Physical Exam Updated Vital Signs BP (!) 147/66   Pulse 79   Temp 98.2 F (36.8 C) (Oral)   Resp 20   Ht 5\' 11"  (1.803 m)   Wt 91.8 kg   SpO2 98%   BMI 28.23 kg/m   Physical Exam Vitals and nursing note reviewed.  Constitutional:      Appearance: He is obese. He is not ill-appearing.  HENT:  Head: Normocephalic and atraumatic.     Nose: Nose normal.     Mouth/Throat:     Mouth: Mucous membranes are moist.     Pharynx: Oropharynx is clear. Uvula midline. No oropharyngeal exudate or posterior oropharyngeal erythema.     Tonsils: No tonsillar exudate.  Eyes:     General: Lids are normal. Vision grossly intact.        Right eye: No discharge.        Left eye: No discharge.     Extraocular Movements: Extraocular movements intact.     Conjunctiva/sclera: Conjunctivae normal.     Pupils: Pupils are equal, round, and reactive to light.  Neck:     Trachea: Trachea and phonation normal.  Cardiovascular:     Rate and Rhythm: Normal rate and regular rhythm.     Pulses:          Radial pulses are 2+ on the right side and 2+ on the left side.       Dorsalis pedis pulses are 1+ on the right side and 1+ on the left side.     Heart sounds: Normal heart sounds. No murmur heard.     Arteriovenous access: left arteriovenous access is present.    Comments:  AV fistula in left arm, scheduled for fistulogram tomorrow.  Pulmonary:     Effort: Pulmonary effort is normal. No tachypnea, bradypnea, accessory muscle usage or respiratory distress.     Breath sounds: Normal breath sounds. No wheezing or rales.  Chest:     Chest wall: No lacerations, deformity, swelling, tenderness, crepitus or edema.    Abdominal:     General: Bowel sounds are normal. There is no distension.     Palpations: Abdomen is soft.     Tenderness: There is no abdominal tenderness. There is no right CVA tenderness, left CVA tenderness, guarding or rebound.  Musculoskeletal:        General: No deformity.     Cervical back: Normal range of motion and neck supple. No tenderness or crepitus. No pain with movement, spinous process tenderness or muscular tenderness.     Right lower leg: 1+ Edema present.     Left lower leg: 1+ Edema present.  Lymphadenopathy:     Cervical: No cervical adenopathy.  Skin:    General: Skin is warm and dry.     Capillary Refill: Capillary refill takes less than 2 seconds.  Neurological:     Mental Status: He is alert. He is disoriented and confused.     Motor: Motor function is intact.     Comments: Moving all 4 extremities spontaneously and without difficulty.   Psychiatric:        Attention and Perception: He is inattentive. He perceives visual hallucinations.        Mood and Affect: Affect is labile.        Speech: Speech is rapid and pressured and tangential.        Behavior: Behavior is agitated, aggressive and combative.     ED Results / Procedures / Treatments   Labs (all labs ordered are listed, but only abnormal results are displayed) Labs Reviewed  COMPREHENSIVE METABOLIC PANEL - Abnormal; Notable for the following components:      Result Value   Potassium 3.4 (*)    BUN 30 (*)    Creatinine, Ser 6.05 (*)    GFR, Estimated 9 (*)    All other components within normal limits  CBC - Abnormal; Notable for the following  components:    RBC 3.30 (*)    Hemoglobin 10.3 (*)    HCT 34.0 (*)    MCV 103.0 (*)    RDW 15.8 (*)    All other components within normal limits  CBG MONITORING, ED - Abnormal; Notable for the following components:   Glucose-Capillary 101 (*)    All other components within normal limits  TROPONIN I (HIGH SENSITIVITY) - Abnormal; Notable for the following components:   Troponin I (High Sensitivity) 63 (*)    All other components within normal limits  TROPONIN I (HIGH SENSITIVITY) - Abnormal; Notable for the following components:   Troponin I (High Sensitivity) 54 (*)    All other components within normal limits  LACTIC ACID, PLASMA  URINALYSIS, ROUTINE W REFLEX MICROSCOPIC    EKG EKG Interpretation  Date/Time:  Tuesday April 30 2020 11:22:28 EDT Ventricular Rate:  76 PR Interval:    QRS Duration: 109 QT Interval:  401 QTC Calculation: 451 R Axis:   54 Text Interpretation: Atrial fibrillation Borderline repolarization abnormality No significant change since last tracing Confirmed by Taylor Willis [id number]) on 04/30/2020 2:23:58 PM   Radiology CT HEAD WO CONTRAST  Result Date: 04/29/2020 CLINICAL DATA:  Altered mental status, end-stage renal disease EXAM: CT HEAD WITHOUT CONTRAST TECHNIQUE: Contiguous axial images were obtained from the base of the skull through the vertex without intravenous contrast. COMPARISON:  None. FINDINGS: Brain: Normal anatomic configuration. Parenchymal volume loss is commensurate with the patient's age. Moderate periventricular white matter changes are present likely reflecting the sequela of small vessel ischemia. No abnormal intra or extra-axial mass lesion or fluid collection. No abnormal mass effect or midline shift. No evidence of acute intracranial hemorrhage or infarct. Ventricular size is normal. Cerebellum unremarkable. Vascular: No asymmetric hyperdense vasculature at the skull base. Skull: Intact Sinuses/Orbits: Paranasal sinuses are clear. Orbits are  unremarkable. Other: Mastoid air cells and middle ear cavities are clear. IMPRESSION: No acute intracranial abnormality.  Moderate senescent change. Electronically Signed   By: Fidela Salisbury MD   On: 04/29/2020 00:55   DG Chest Port 1 View  Result Date: 04/29/2020 CLINICAL DATA:  Altered mental status EXAM: PORTABLE CHEST 1 VIEW COMPARISON:  04/08/2020 FINDINGS: Cardiac shadow is stable. Dialysis catheter is again noted and stable. Previously seen changes of congestive failure have resolved in the interval. Minimal left pleural effusion is noted. IMPRESSION: Small left pleural effusion. Previously seen congestive failure has resolved. Electronically Signed   By: Inez Catalina M.D.   On: 04/29/2020 01:01    Procedures Procedures   Medications Ordered in ED Medications  Chlorhexidine Gluconate Cloth 2 % PADS 6 each (has no administration in time range)  0.9 %  sodium chloride infusion (has no administration in time range)  0.9 %  sodium chloride infusion (has no administration in time range)  heparin injection 1,000 Units (has no administration in time range)  haloperidol lactate (HALDOL) injection 5 mg (has no administration in time range)  OLANZapine (ZYPREXA) injection 2.5 mg (2.5 mg Intramuscular Given 04/30/20 1154)    ED Course  I have reviewed the triage vital signs and the nursing notes.  Pertinent labs & imaging results that were available during my care of the patient were reviewed by me and considered in my medical decision making (see chart for details).  Clinical Course as of 04/30/20 1904  Tue Apr 30, 2020  1258 No electrolyte or metabolic derangements on patient's laboratory studies today, lactic acid is normal, patient without a leukocytosis.  Appears this may be a behavioral issue secondary to patient's underlying dementia. Will contact nephrology to discuss best treatment plan from a dialysis standpoint.  [RS]  0347 Consult to Dr. Marval Regal, nephrologist, who recommends  dialysis here today. Will observe in the ED until he is dialyzed and then will discharge home if he remains stable.  [RS]    Clinical Course User Index [RS] Taylor Willis, Gypsy Balsam, PA-C   MDM Rules/Calculators/A&P                         80 year old male with episode of recurrent altered mental status coming from facility.  He is dialysis dependent Tuesday/Thursday/Saturday, did not receive dialysis today.  The differential diagnosis for AMS is extensive and includes, but is not limited to:  Marland Kitchen Drug overdose - opioids, alcohol, sedatives, antipsychotics, drug withdrawal, others . Metabolic: hypoxia, hypoglycemia, hyperglycemia, hypercalcemia, hypernatremia, hyponatremia, uremia, hepatic encephalopathy, hypothyroidism, hyperthyroidism, vitamin B12 or thiamine deficiency, carbon monoxide poisoning, Wilson's disease, Lactic acidosis, DKA/HHOS . Infectious: meningitis, encephalitis, bacteremia/sepsis, urinary tract infection, pneumonia, neurosyphilis . Structural: Space-occupying lesion, (brain tumor, subdural hematoma, hydrocephalus,) . Vascular: stroke, subarachnoid hemorrhage, coronary ischemia, hypertensive encephalopathy, CNS vasculitis, thrombotic thrombocytopenic purpura, disseminated intravascular coagulation, hyperviscosity . Psychiatric: Schizophrenia, depression; Other: Seizure, hypothermia, heat stroke, ICU psychosis, dementia -"sundowning."  Hypertensive on intake, vital signs otherwise normal.  Patient is agitated, yelling, singing, stating that "Jesus will kill you".  Cardiopulmonary exam is unremarkable, abdominal exam is benign.  Patient is neurovascular intact in all 4 extremities moving all 4 extremities without difficulty.  Patient underwent extensive altered mental status evaluation and was recently discharged yesterday back to his facility; largely benign work-up inpatient, suspected metabolic encephalopathy with component of behavioral changes secondary to likely dementia.  Patient  is currently in a rehab facility.  CBC with baseline anemia with hemoglobin of 10.3.  CMP with mild hyponatremia of 3.4, baseline creatinine of 6.  Troponin is flat 54, patient's baseline.  Lactic acid is normal at 1.2.  Patient does not make urine therefore UA not obtained.  EKG with atrial fibrillation at patient baseline, no change from prior.   Consult placed to nephrologist as above, who recommends dialyzing the patient while in the emergency department.  Given otherwise reassuring work-up in the ED at this time, patient may be discharged home after completion of dialysis.  Patient's wife is at the bedside and she voiced understanding of his medical evaluation and treatment plan.  Each of her questions was answered to her expressed satisfaction.  Return precautions were given.  This chart was dictated using voice recognition software, Dragon. Despite the best efforts of this provider to proofread and correct errors, errors may still occur which can change documentation meaning.  Final Clinical Impression(s) / ED Diagnoses Final diagnoses:  None    Rx / DC Orders ED Discharge Orders    None       Taylor Willis 04/30/20 1904    Taylor Belling, MD 04/30/20 1958

## 2020-04-30 NOTE — ED Notes (Signed)
Wife stated this confusion and hallucinations has been for about a week and  A half. Pt refusing cardiac leads and oxygen saturation.

## 2020-04-30 NOTE — ED Notes (Signed)
Update given to [facility] nurse.

## 2020-04-30 NOTE — ED Notes (Signed)
Pt will not leave oxygen on finger

## 2020-04-30 NOTE — Procedures (Signed)
   HEMODIALYSIS TREATMENT NOTE:  Reportedly combative earlier today but calm and cooperative pre-HD.  Wife at bedside, reports AMS onset correlates with a new prescription for oral Valtrex - d/w EDP.  Reports outpatient HD center is having difficulty cannulating tortuous AVF. Has appointment for fistulogram tomorrow in [place].  [facility] utilized for HD today; exit site is unremarkable.  Uneventful treatment for the first 2.5 hours.  Hemodynamically stable throughout session.  During last hour pt would occasionally begin yelling aloud "JESUS!!"  He was redirectable and cooperative with treatment.  3.5 hour session completed.  Goal met: 1.5 liters removed.  All blood was returned.  At end of treatment, wife requested a dose of Haldol for patient to keep him calm for transport to SNF.   Rockwell Alexandria, RN

## 2020-04-30 NOTE — ED Triage Notes (Addendum)
Pt arrived [facility] stating [facility] called due to pt being combative and confused., Pt yelling and attacking towards admission lady. Pt knows name and birth date but thinks he is in [place]. Pt is calm and cooperpative at this time. Dialysis port noted to top right chest with dressing, fistula to left arm, bruises noted throughout.

## 2020-04-30 NOTE — ED Notes (Signed)
Pt yelling out for Jesus to help and to save him and other people.

## 2020-04-30 NOTE — Discharge Instructions (Addendum)
Taylor Willis was seen in the ER today for his altered mental status.  His work-up was largely the same as it was in his recent hospital admission from which he was discharged yesterday.  While the exact cause of his mental status change remains unclear, there is not appear to be any emergent problem.  Below is contact information for Dr. Merlene Laughter, neurologist here in [place] with him in follow-up for further evaluation.  If you have concern for possible side effects of recent medications that Taylor Willis has been on, please discuss these with Dr. Merlene Laughter.  He was dialyzed here in the emergency department today and should continue his dialysis as scheduled at his facility.  Return to the emergency department develops any new difficulty breathing, nausea or vomiting does not stop, chest pain, changes in his behavior, or any other new severe symptoms.

## 2020-04-30 NOTE — ED Notes (Signed)
MAR noted dialysis days are Tues, Thurs,  Sat at 1045 am.

## 2020-05-01 ENCOUNTER — Ambulatory Visit (HOSPITAL_COMMUNITY): Admission: RE | Admit: 2020-05-01 | Payer: Medicare Other | Source: Home / Self Care | Admitting: Vascular Surgery

## 2020-05-01 ENCOUNTER — Telehealth: Payer: Self-pay

## 2020-05-01 SURGERY — A/V FISTULAGRAM
Anesthesia: LOCAL | Laterality: Left

## 2020-05-01 NOTE — Telephone Encounter (Signed)
Received call from [place] at [facility] requesting to cancel pts fistulogram procedure for today due to recent ER/hospital visits. Told to contact pts wife regarding rescheduling procedure.   Spoke with pts wife. States unsure when will reschedule procedure d/t pt has altered mental status for unknown reason. Reports CT head negative and plans to make appt with neurologist. Advised to notify referring nephrologist of situation and contact our office when pt stable and ready to reschedule. Wife voiced understanding.

## 2020-05-03 ENCOUNTER — Other Ambulatory Visit: Payer: Self-pay

## 2020-05-22 ENCOUNTER — Other Ambulatory Visit: Payer: Self-pay

## 2020-05-22 ENCOUNTER — Encounter (HOSPITAL_COMMUNITY): Payer: Self-pay | Admitting: Vascular Surgery

## 2020-05-22 ENCOUNTER — Encounter (HOSPITAL_COMMUNITY)
Admission: RE | Disposition: A | Discharge status: Home / Self Care | Payer: Self-pay | Source: Home / Self Care | Attending: Vascular Surgery

## 2020-05-22 ENCOUNTER — Ambulatory Visit (HOSPITAL_COMMUNITY)
Admission: RE | Admit: 2020-05-22 | Discharge: 2020-05-22 | Disposition: A | Discharge status: Home / Self Care | Payer: Medicare Other | Attending: Vascular Surgery | Admitting: Vascular Surgery

## 2020-05-22 DIAGNOSIS — Y841 Kidney dialysis as the cause of abnormal reaction of the patient, or of later complication, without mention of misadventure at the time of the procedure: Secondary | ICD-10-CM | POA: Insufficient documentation

## 2020-05-22 DIAGNOSIS — Z79899 Other long term (current) drug therapy: Secondary | ICD-10-CM | POA: Insufficient documentation

## 2020-05-22 DIAGNOSIS — Z20822 Contact with and (suspected) exposure to covid-19: Secondary | ICD-10-CM | POA: Diagnosis not present

## 2020-05-22 DIAGNOSIS — Z992 Dependence on renal dialysis: Secondary | ICD-10-CM

## 2020-05-22 DIAGNOSIS — N186 End stage renal disease: Secondary | ICD-10-CM

## 2020-05-22 DIAGNOSIS — T82858A Stenosis of vascular prosthetic devices, implants and grafts, initial encounter: Secondary | ICD-10-CM | POA: Insufficient documentation

## 2020-05-22 HISTORY — PX: PERIPHERAL VASCULAR BALLOON ANGIOPLASTY: CATH118281

## 2020-05-22 LAB — POCT I-STAT, CHEM 8
BUN: 24 mg/dL — ABNORMAL HIGH (ref 8–23)
Calcium, Ion: 1.12 mmol/L — ABNORMAL LOW (ref 1.15–1.40)
Chloride: 94 mmol/L — ABNORMAL LOW (ref 98–111)
Creatinine, Ser: 4 mg/dL — ABNORMAL HIGH (ref 0.61–1.24)
Glucose, Bld: 85 mg/dL (ref 70–99)
HCT: 35 % — ABNORMAL LOW (ref 39.0–52.0)
Hemoglobin: 11.9 g/dL — ABNORMAL LOW (ref 13.0–17.0)
Potassium: 3.4 mmol/L — ABNORMAL LOW (ref 3.5–5.1)
Sodium: 138 mmol/L (ref 135–145)
TCO2: 32 mmol/L (ref 22–32)

## 2020-05-22 LAB — SARS CORONAVIRUS 2 BY RT PCR (HOSPITAL ORDER, PERFORMED IN ~~LOC~~ HOSPITAL LAB): SARS Coronavirus 2: NEGATIVE

## 2020-05-22 SURGERY — PERIPHERAL VASCULAR BALLOON ANGIOPLASTY
Anesthesia: LOCAL

## 2020-05-22 MED ORDER — HEPARIN (PORCINE) IN NACL 1000-0.9 UT/500ML-% IV SOLN
INTRAVENOUS | Status: DC | PRN
  Administered 2020-05-22: 500 mL

## 2020-05-22 MED ORDER — HEPARIN (PORCINE) IN NACL 1000-0.9 UT/500ML-% IV SOLN
INTRAVENOUS | Status: AC
  Filled 2020-05-22: qty 500

## 2020-05-22 MED ORDER — IODIXANOL 320 MG/ML IV SOLN
INTRAVENOUS | Status: DC | PRN
  Administered 2020-05-22: 40 mL via INTRAVENOUS

## 2020-05-22 MED ORDER — LIDOCAINE HCL (PF) 1 % IJ SOLN
INTRAMUSCULAR | Status: DC | PRN
  Administered 2020-05-22: 5 mL via INTRADERMAL

## 2020-05-22 MED ORDER — LIDOCAINE HCL (PF) 1 % IJ SOLN
INTRAMUSCULAR | Status: AC
  Filled 2020-05-22: qty 30

## 2020-05-22 SURGICAL SUPPLY — 20 items
BAG SNAP BAND KOVER 36X36 (MISCELLANEOUS) ×3 IMPLANT
BALLN ATHLETIS 8X60X75 (BALLOONS) ×3
BALLN MUSTANG 8X60X75 (BALLOONS) ×3
BALLOON ATHLETIS 8X60X75 (BALLOONS) ×2 IMPLANT
BALLOON MUSTANG 8X60X75 (BALLOONS) ×2 IMPLANT
CATH ANGIO 5F BER2 65CM (CATHETERS) ×3 IMPLANT
COVER DOME SNAP 22 D (MISCELLANEOUS) ×3 IMPLANT
DEVICE TORQUE H2O (MISCELLANEOUS) ×3 IMPLANT
GUIDEWIRE ANGLED .035X150CM (WIRE) ×3 IMPLANT
KIT ENCORE 26 ADVANTAGE (KITS) ×3 IMPLANT
KIT MICROPUNCTURE NIT STIFF (SHEATH) ×3 IMPLANT
PROTECTION STATION PRESSURIZED (MISCELLANEOUS) ×3
SHEATH PINNACLE R/O II 6F 4CM (SHEATH) ×3 IMPLANT
SHEATH PROBE COVER 6X72 (BAG) ×6 IMPLANT
STATION PROTECTION PRESSURIZED (MISCELLANEOUS) ×2 IMPLANT
STOPCOCK MORSE 400PSI 3WAY (MISCELLANEOUS) ×3 IMPLANT
TRAY PV CATH (CUSTOM PROCEDURE TRAY) ×3 IMPLANT
TUBING CIL FLEX 10 FLL-RA (TUBING) ×3 IMPLANT
WIRE AMPLATZ SS-J .035X180CM (WIRE) ×3 IMPLANT
WIRE BENTSON .035X145CM (WIRE) ×3 IMPLANT

## 2020-05-22 NOTE — Interval H&P Note (Signed)
History and Physical Interval Note:  05/22/2020 9:59 AM  Taylor Willis  has presented today for surgery, with the diagnosis of instage renal.  The various methods of treatment have been discussed with the patient and family. After consideration of risks, benefits and other options for treatment, the patient has consented to  Procedure(s): A/V FISTULAGRAM - Left arm (N/A) as a surgical intervention.  The patient's history has been reviewed, patient examined, no change in status, stable for surgery.  I have reviewed the patient's chart and labs.  Questions were answered to the patient's satisfaction.     Cherre Robins

## 2020-05-22 NOTE — Discharge Instructions (Signed)
Dialysis Fistulogram, Care After The following information offers guidance on how to care for yourself after your procedure. Your health care provider may also give you more specific instructions. If you have problems or questions, contact your health care provider. What can I expect after the procedure? After the procedure, it is common to have:  A small amount of discomfort in the area where the small tube (catheter) was placed for the procedure.  A small amount of bruising around the fistula.  Sleepiness and tiredness (fatigue). Follow these instructions at home: Puncture site care  Follow instructions from your health care provider about how to take care of the site where catheters were inserted. Make sure you: ? Wash your hands with soap and water for at least 20 seconds before and after you change your bandage (dressing). If soap and water are not available, use hand sanitizer. ? Change your dressing as told by your health care provider. ? Leave stitches (sutures), skin glue, or adhesive strips in place. These skin closures may need to stay in place for 2 weeks or longer. If adhesive strip edges start to loosen and curl up, you may trim the loose edges. Do not remove adhesive strips completely unless your health care provider tells you to do that.  Check your puncture area every day for signs of infection. Check for: ? More redness, swelling, or pain. ? Fluid or blood. ? Warmth. ? Pus or a bad smell.   Activity  Rest as much as you can.  If you were given a sedative during the procedure, it can affect you for several hours. Do not drive or operate machinery until your health care provider says that it is safe.  Do not lift anything that is heavier than 5 lb (2.3 kg), or the limit that you are told, on the day of your procedure.  Do not do anything strenuous with your arm for the rest of the day. Avoid household activities, such as vacuuming.  Return to your normal activities as  told by your health care provider. Ask your health care provider what activities are safe for you. Safety To prevent damage to your graft or fistula:  Do not wear tight-fitting clothing or jewelry on the arm or leg that has your graft or fistula.  Tell all your health care providers that you have a dialysis fistula or graft.  Do not allow blood draws, IVs, or blood pressure readings to be done in the arm that has your fistula or graft.  Do not allow flu shots or vaccinations in the arm with your fistula or graft. General instructions  Take over-the-counter and prescription medicines only as told by your health care provider.  Do not take baths, swim, or use a hot tub until your health care provider approves. Ask your health care provider if you may take showers. You may only be allowed to take sponge baths.  Monitor your dialysis fistula closely. Check to make sure that you can feel a vibration or buzz (a thrill) when you put your fingers over the fistula.  Keep all follow-up visits. This is important. Contact a health care provider if:  You have more redness, swelling, or pain at the site where the catheter was put in.  You have fluid or blood coming from the catheter site.  You have pus or a bad smell coming from the catheter site.  Your catheter site feels warm.  You have a fever or chills. Get help right away if:    You have bleeding from the vascular access site that does not stop.  You feel weak.  You have trouble balancing.  You have trouble moving your arms or legs.  You have problems with your speech or vision.  You can no longer feel a vibration or buzz when you put your fingers over your fistula.  The limb that was used for the procedure swells or becomes painful, cold, blue, or pale white.  You have chest pain or shortness of breath. These symptoms may represent a serious problem that is an emergency. Do not wait to see if the symptoms will go away. Get  medical help right away. Call your local emergency services (911 in the [place] Do not drive yourself to the hospital. Summary  After a dialysis fistulogram, it is common to have a small amount of discomfort or bruising in the area where the small, thin tube (catheter) was placed.  Rest as much as you can after your procedure. Return to your normal activities as told by your health care provider.  Take over-the-counter and prescription medicines only as told by your health care provider.  Follow instructions from your health care provider about how to take care of the site where the catheter was inserted.  Keep all follow-up visits. This is important. This information is not intended to replace advice given to you by your health care provider. Make sure you discuss any questions you have with your health care provider. Document Revised: 08/30/2019 Document Reviewed: 08/30/2019 Elsevier Patient Education  2021 Elsevier Inc.  

## 2020-05-22 NOTE — Op Note (Signed)
DATE OF SERVICE: 05/22/2020  PATIENT:  Taylor Willis  80 y.o. male  PRE-OPERATIVE DIAGNOSIS:  Left brachiocephalic AV fistula dysfunction  POST-OPERATIVE DIAGNOSIS:  Same, left cephalic arch stenosis  PROCEDURE:   1) left upper extremity fistulagram 2) cephalic arch angioplasty (8x75mm Athletis)  SURGEON:  Surgeon(s) and Role:    * Cherre Robins, MD - Primary  ASSISTANT: none  ANESTHESIA:   local  EBL: min  BLOOD ADMINISTERED:none  DRAINS: none   LOCAL MEDICATIONS USED:  LIDOCAINE   SPECIMEN:  none  COUNTS: confirmed correct.  TOURNIQUET:  None  PATIENT DISPOSITION:  PACU - hemodynamically stable.   Delay start of Pharmacological VTE agent (>24hrs) due to surgical blood loss or risk of bleeding: no  INDICATION FOR PROCEDURE: Taylor Willis is a 80 y.o. male with dysfunction of his left arm arteriovenous fistula. After careful discussion of risks, benefits, and alternatives the patient was offered fistulagram with possible intervention. The patient understood and wished to proceed.  OPERATIVE FINDINGS:  Two areas of stenosis in cephalic arch resolved with angioplasty   DESCRIPTION OF PROCEDURE: After identification of the patient in the pre-operative holding area, the patient was transferred to the operating room. The patient was positioned supine on the operating room table. Anesthesia was induced. The groins was prepped and draped in standard fashion. A surgical pause was performed confirming correct patient, procedure, and operative location.  The left arm was anesthetized with subcutaneous injection of 1% lidocaine. The fistula was accessed with micropuncture technique. The 62F sheath was upsized to 82F.   Fistuagram was performed. This revealed two areas of cephalic arch stenosis.  The cephalic arch was quite tortuous. Using a series of wire and catheter exchanges I was able to secure a 035 Amplatz wire into the SVC. Angioplasty of the cephalic arch lesions was  performed with a 8x59mm Athletis ballon. No residual was seen post angioplasty.   The wire, balloon, and sheath were removed. A figure-of-eight stitch was placed about the access site. Hemostasis was achieved.  Upon completion of the case instrument and sharps counts were confirmed correct. The patient was transferred to the PACU in good condition. I was present for all portions of the procedure.  PLAN: OK to start HD via LUE BC AVF. Follow up in one month with PA for repeat AVF duplex.   Taylor Willis. Taylor Breed, MD [facility] Phone Number: [phone] 05/22/2020 10:39 AM

## 2020-06-07 ENCOUNTER — Other Ambulatory Visit: Payer: Self-pay | Admitting: *Deleted

## 2020-06-07 DIAGNOSIS — N186 End stage renal disease: Secondary | ICD-10-CM

## 2020-06-26 ENCOUNTER — Other Ambulatory Visit: Payer: Self-pay

## 2020-06-26 ENCOUNTER — Encounter: Payer: Self-pay | Admitting: Physician Assistant

## 2020-06-26 ENCOUNTER — Ambulatory Visit (HOSPITAL_COMMUNITY)
Admission: RE | Admit: 2020-06-26 | Discharge: 2020-06-26 | Disposition: A | Discharge status: Home / Self Care | Payer: Medicare Other | Source: Ambulatory Visit | Attending: Vascular Surgery | Admitting: Vascular Surgery

## 2020-06-26 ENCOUNTER — Ambulatory Visit (INDEPENDENT_AMBULATORY_CARE_PROVIDER_SITE_OTHER): Payer: Medicare Other | Admitting: Physician Assistant

## 2020-06-26 VITALS — BP 152/76 | HR 65 | Temp 98.6°F | Resp 20 | Ht 71.0 in | Wt 180.4 lb

## 2020-06-26 DIAGNOSIS — N186 End stage renal disease: Secondary | ICD-10-CM | POA: Diagnosis not present

## 2020-06-26 NOTE — Progress Notes (Signed)
History of Present Illness:  Patient is a 80 y.o. year old male who presents for follow up s/p fistulogram with angioplasty intervention on 05/22/20.   He has a left brachiocephalic fistula that was created in January 2021.  He underwent sidebranch ligation by Dr. Oneida Alar on 09/12/2019.  Most recently he had a right IJ TDC placed by Dr. Constance Haw at [facility] on 04/08/2020 to initiate hemodialysis.  He was referred back to our clinic due to trouble with cannulation.   He had a PD catheter placed in [place] by [facility].  He still has a TDC and the left fistula was used on his last HD day without problems.    Past Medical History:  Diagnosis Date  . Arthritis    gout  . Asthma    has rescue inhaler  . Cancer [facility])    prostate  . Chronic kidney disease   . GERD (gastroesophageal reflux disease)   . H/O peptic ulcer 1970's  . Herpes 04/28/2020  . Hyperlipidemia   . Hypertension   . Ureteral stone   . Wears dentures   . Wears glasses     Past Surgical History:  Procedure Laterality Date  . AV FISTULA PLACEMENT Left 02/15/2019   Procedure: ARTERIOVENOUS (AV) FISTULA CREATION LEFT ARM;  Surgeon: Rosetta Posner, MD;  Location: [place];  Service: Vascular;  Laterality: Left;  . CATARACT EXTRACTION W/ INTRAOCULAR LENS  IMPLANT, BILATERAL    . CYSTOSCOPY/RETROGRADE/URETEROSCOPY/STONE EXTRACTION WITH BASKET Right 05/23/2012   Procedure: CYSTOSCOPY/RETROGRADE/URETEROSCOPY/STONE EXTRACTION WITH BASKET;  Surgeon: Bernestine Amass, MD;  [facility];  Service: Urology;  Laterality: Right;  . FRACTURE SURGERY Right    as a child  . HERNIA REPAIR    . HOLMIUM LASER APPLICATION Right 06/19/15   Procedure: HOLMIUM LASER APPLICATION;  Surgeon: Bernestine Amass, MD;  Location: [facility];  Service: Urology;  Laterality: Right;  . INSERTION OF DIALYSIS CATHETER Right 04/08/2020   Procedure: INSERTION OF TUNNELED DIALYSIS CATHETER;  Surgeon: Virl Cagey, MD;  Location: [facility];  Service: General;  Laterality: Right;  . LIGATION OF ARTERIOVENOUS  FISTULA Left 09/12/2019   Procedure: SIDE BRANCH LIGATION  OF LEFT ARM ARTERIOVENOUS FISTULA;  Surgeon: Elam Dutch, MD;  Location: [place];  Service: Vascular;  Laterality: Left;  Marland Kitchen MULTIPLE TOOTH EXTRACTIONS    . PERIPHERAL VASCULAR BALLOON ANGIOPLASTY  05/22/2020   Procedure: PERIPHERAL VASCULAR BALLOON ANGIOPLASTY;  Surgeon: Cherre Robins, MD;  Location: [place] [facility];  Service: Cardiovascular;;  Left arm fistula  . REVISON OF ARTERIOVENOUS FISTULA Left 09/12/2019   Procedure: REVISON OF LEFT ARM ARTERIOVENOUS FISTULA;  Surgeon: Elam Dutch, MD;  Location: [place];  Service: Vascular;  Laterality: Left;  . ROBOT ASSISTED LAPAROSCOPIC RADICAL PROSTATECTOMY  2011  . TONSILLECTOMY       Social History Social History   Tobacco Use  . Smoking status: Former Smoker    Types: Cigarettes    Quit date: 05/18/1982    Years since quitting: 38.1  . Smokeless tobacco: Never Used  Vaping Use  . Vaping Use: Never used  Substance Use Topics  . Alcohol use: Not Currently    Alcohol/week: 1.0 standard drink    Types: 1 Standard drinks or equivalent per week  . Drug use: Never    Family History No family history on file.  Allergies  Allergies  Allergen Reactions  . Aspirin Other (See  Comments)    GI irritation   . Hydrochlorothiazide     Dizziness and unable to walk  . Ibuprofen Other (See Comments)    GI irritation      Current Outpatient Medications  Medication Sig Dispense Refill  . albuterol (PROVENTIL HFA;VENTOLIN HFA) 108 (90 BASE) MCG/ACT inhaler Inhale 2 puffs into the lungs every 6 (six) hours as needed for wheezing.    Marland Kitchen allopurinol (ZYLOPRIM) 100 MG tablet Take by mouth.    . calcitRIOL (ROCALTROL) 0.25 MCG capsule Take by mouth.    . calcium acetate (PHOSLO) 667 MG capsule Take 667 mg by mouth 2 (two) times daily with a meal.    . carvedilol (COREG)  3.125 MG tablet Take 1 tablet (3.125 mg total) by mouth See admin instructions. Take 1 tablet in the evenings on Dialysis days (Tues, Thurs, and Sat), On Non-dialysis days take 1 tablet twice a day. 60 tablet 5  . Cholecalciferol 25 MCG (1000 UT) tablet Take 1,000 Units by mouth daily.     . Cinnamon 500 MG capsule Take 500 mg by mouth daily.    . FEROSUL 325 (65 Fe) MG tablet Take 325 mg by mouth in the morning, at noon, and at bedtime.    Marland Kitchen OLANZapine (ZYPREXA) 2.5 MG tablet Take 1 tablet (2.5 mg total) by mouth at bedtime as needed (Agitation/hallucinations). 30 tablet 0  . vitamin B-12 (CYANOCOBALAMIN) 1000 MCG tablet Take 1,000 mcg by mouth daily.     No current facility-administered medications for this visit.    ROS:   General:  No weight loss, Fever, chills  HEENT: No recent headaches, no nasal bleeding, no visual changes, no sore throat  Neurologic: No dizziness, blackouts, seizures. No recent symptoms of stroke or mini- stroke. No recent episodes of slurred speech, or temporary blindness.  Cardiac: No recent episodes of chest pain/pressure, no shortness of breath at rest.  No shortness of breath with exertion.  Denies history of atrial fibrillation or irregular heartbeat  Vascular: No history of rest pain in feet.  No history of claudication.  No history of non-healing ulcer, No history of DVT   Pulmonary: No home oxygen, no productive cough, no hemoptysis,  No asthma or wheezing  Musculoskeletal:  [ ]  Arthritis, [ ]  Low back pain,  [ ]  Joint pain  Hematologic:No history of hypercoagulable state.  No history of easy bleeding.  No history of anemia  Gastrointestinal: No hematochezia or melena,  No gastroesophageal reflux, no trouble swallowing  Urinary: [ ]  chronic Kidney disease, [x ] on HD - [ ]  MWF or [ ]  TTHS, [ ]  Burning with urination, [ ]  Frequent urination, [ ]  Difficulty urinating;   Skin: No rashes  Psychological: No history of anxiety,  No history of  depression   Physical Examination  Vitals:   06/26/20 1409  BP: (!) 152/76  Pulse: 65  Resp: 20  Temp: 98.6 F (37 C)  TempSrc: Temporal  SpO2: 99%  Weight: 180 lb 6.4 oz (81.8 kg)  Height: 5\' 11"  (1.803 m)    Body mass index is 25.16 kg/m.  General:  Alert and oriented, no acute distress HEENT: Normal Neck: No bruit or JVD Pulmonary: Clear to auscultation bilaterally Cardiac: Regular Rate and Rhythm without murmur Gastrointestinal: Soft, non-tender, non-distended, no mass, no scars Skin: No rash Extremity Pulses:  2+ radial, brachial pulses bilaterally Musculoskeletal: No deformity or edema  Neurologic: Upper and lower extremity motor 5/5 and symmetric   ASSESSMENT:  ESRD Functioning  left UE AV fistula s/p angioplasty. [facility] placed PD catheter 06/25/20 he had a right IJ TDC placed by Dr. Constance Haw at [facility] on 04/08/2020 to initiate hemodialysis.   PLAN:  He will use the left UE fistula verse TDC until the PD catheter is ready to use.  Once he has access working I have asked him to evaluate the left AV fistula daily for a thrill as a back up for HD if needed in the future.  He will f/u as needed in the future.  He is dialyzing on a Tuesday Thursday Saturday schedule at [facility] in [place].  Roxy Horseman PA-C [facility] [place] Office: [phone]  MD in office [place]

## 2020-07-05 ENCOUNTER — Inpatient Hospital Stay (HOSPITAL_COMMUNITY): Payer: Medicare Other

## 2020-07-05 ENCOUNTER — Encounter (HOSPITAL_COMMUNITY): Payer: Self-pay | Admitting: Internal Medicine

## 2020-07-05 ENCOUNTER — Inpatient Hospital Stay (HOSPITAL_COMMUNITY)
Admission: EM | Admit: 2020-07-05 | Discharge: 2020-07-07 | DRG: 064 | Disposition: A | Discharge status: Home Health | Payer: Medicare Other | Attending: Internal Medicine | Admitting: Internal Medicine

## 2020-07-05 ENCOUNTER — Emergency Department (HOSPITAL_COMMUNITY): Payer: Medicare Other

## 2020-07-05 ENCOUNTER — Other Ambulatory Visit: Payer: Self-pay

## 2020-07-05 DIAGNOSIS — I639 Cerebral infarction, unspecified: Secondary | ICD-10-CM | POA: Diagnosis present

## 2020-07-05 DIAGNOSIS — D649 Anemia, unspecified: Secondary | ICD-10-CM | POA: Diagnosis present

## 2020-07-05 DIAGNOSIS — Z9181 History of falling: Secondary | ICD-10-CM

## 2020-07-05 DIAGNOSIS — I63432 Cerebral infarction due to embolism of left posterior cerebral artery: Principal | ICD-10-CM | POA: Diagnosis present

## 2020-07-05 DIAGNOSIS — M109 Gout, unspecified: Secondary | ICD-10-CM | POA: Diagnosis present

## 2020-07-05 DIAGNOSIS — I4891 Unspecified atrial fibrillation: Secondary | ICD-10-CM | POA: Diagnosis present

## 2020-07-05 DIAGNOSIS — I120 Hypertensive chronic kidney disease with stage 5 chronic kidney disease or end stage renal disease: Secondary | ICD-10-CM | POA: Diagnosis present

## 2020-07-05 DIAGNOSIS — Z87891 Personal history of nicotine dependence: Secondary | ICD-10-CM | POA: Diagnosis not present

## 2020-07-05 DIAGNOSIS — E8889 Other specified metabolic disorders: Secondary | ICD-10-CM | POA: Diagnosis present

## 2020-07-05 DIAGNOSIS — Z8546 Personal history of malignant neoplasm of prostate: Secondary | ICD-10-CM | POA: Diagnosis not present

## 2020-07-05 DIAGNOSIS — Z886 Allergy status to analgesic agent status: Secondary | ICD-10-CM | POA: Diagnosis not present

## 2020-07-05 DIAGNOSIS — Z992 Dependence on renal dialysis: Secondary | ICD-10-CM | POA: Diagnosis not present

## 2020-07-05 DIAGNOSIS — Z20822 Contact with and (suspected) exposure to covid-19: Secondary | ICD-10-CM | POA: Diagnosis present

## 2020-07-05 DIAGNOSIS — I480 Paroxysmal atrial fibrillation: Secondary | ICD-10-CM | POA: Diagnosis not present

## 2020-07-05 DIAGNOSIS — J45909 Unspecified asthma, uncomplicated: Secondary | ICD-10-CM | POA: Diagnosis present

## 2020-07-05 DIAGNOSIS — I6389 Other cerebral infarction: Secondary | ICD-10-CM | POA: Diagnosis not present

## 2020-07-05 DIAGNOSIS — Z9079 Acquired absence of other genital organ(s): Secondary | ICD-10-CM

## 2020-07-05 DIAGNOSIS — Z888 Allergy status to other drugs, medicaments and biological substances status: Secondary | ICD-10-CM | POA: Diagnosis not present

## 2020-07-05 DIAGNOSIS — N2581 Secondary hyperparathyroidism of renal origin: Secondary | ICD-10-CM | POA: Diagnosis present

## 2020-07-05 DIAGNOSIS — I6310 Cerebral infarction due to embolism of unspecified precerebral artery: Secondary | ICD-10-CM

## 2020-07-05 DIAGNOSIS — E785 Hyperlipidemia, unspecified: Secondary | ICD-10-CM | POA: Diagnosis present

## 2020-07-05 DIAGNOSIS — R4701 Aphasia: Secondary | ICD-10-CM | POA: Diagnosis present

## 2020-07-05 DIAGNOSIS — N186 End stage renal disease: Secondary | ICD-10-CM | POA: Diagnosis present

## 2020-07-05 DIAGNOSIS — H53461 Homonymous bilateral field defects, right side: Secondary | ICD-10-CM | POA: Diagnosis present

## 2020-07-05 DIAGNOSIS — I440 Atrioventricular block, first degree: Secondary | ICD-10-CM | POA: Diagnosis present

## 2020-07-05 LAB — DIFFERENTIAL
Abs Immature Granulocytes: 0.02 10*3/uL (ref 0.00–0.07)
Basophils Absolute: 0 10*3/uL (ref 0.0–0.1)
Basophils Relative: 1 %
Eosinophils Absolute: 0.2 10*3/uL (ref 0.0–0.5)
Eosinophils Relative: 3 %
Immature Granulocytes: 0 %
Lymphocytes Relative: 18 %
Lymphs Abs: 1.2 10*3/uL (ref 0.7–4.0)
Monocytes Absolute: 0.7 10*3/uL (ref 0.1–1.0)
Monocytes Relative: 10 %
Neutro Abs: 4.5 10*3/uL (ref 1.7–7.7)
Neutrophils Relative %: 68 %

## 2020-07-05 LAB — COMPREHENSIVE METABOLIC PANEL
ALT: 8 U/L (ref 0–44)
AST: 20 U/L (ref 15–41)
Albumin: 2.9 g/dL — ABNORMAL LOW (ref 3.5–5.0)
Alkaline Phosphatase: 78 U/L (ref 38–126)
Anion gap: 8 (ref 5–15)
BUN: 19 mg/dL (ref 8–23)
CO2: 29 mmol/L (ref 22–32)
Calcium: 8.7 mg/dL — ABNORMAL LOW (ref 8.9–10.3)
Chloride: 102 mmol/L (ref 98–111)
Creatinine, Ser: 4.47 mg/dL — ABNORMAL HIGH (ref 0.61–1.24)
GFR, Estimated: 13 mL/min — ABNORMAL LOW (ref >60)
Glucose, Bld: 98 mg/dL (ref 70–99)
Potassium: 3.1 mmol/L — ABNORMAL LOW (ref 3.5–5.1)
Sodium: 139 mmol/L (ref 135–145)
Total Bilirubin: 0.5 mg/dL (ref 0.3–1.2)
Total Protein: 6.7 g/dL (ref 6.5–8.1)

## 2020-07-05 LAB — APTT: aPTT: 28 seconds (ref 24–36)

## 2020-07-05 LAB — RESP PANEL BY RT-PCR (FLU A&B, COVID) ARPGX2
Influenza A by PCR: NEGATIVE
Influenza B by PCR: NEGATIVE
SARS Coronavirus 2 by RT PCR: NEGATIVE

## 2020-07-05 LAB — I-STAT CHEM 8, ED
BUN: 24 mg/dL — ABNORMAL HIGH (ref 8–23)
Calcium, Ion: 1.13 mmol/L — ABNORMAL LOW (ref 1.15–1.40)
Chloride: 99 mmol/L (ref 98–111)
Creatinine, Ser: 4.5 mg/dL — ABNORMAL HIGH (ref 0.61–1.24)
Glucose, Bld: 98 mg/dL (ref 70–99)
HCT: 34 % — ABNORMAL LOW (ref 39.0–52.0)
Hemoglobin: 11.6 g/dL — ABNORMAL LOW (ref 13.0–17.0)
Potassium: 3.4 mmol/L — ABNORMAL LOW (ref 3.5–5.1)
Sodium: 140 mmol/L (ref 135–145)
TCO2: 30 mmol/L (ref 22–32)

## 2020-07-05 LAB — CBC
HCT: 36.2 % — ABNORMAL LOW (ref 39.0–52.0)
Hemoglobin: 10.6 g/dL — ABNORMAL LOW (ref 13.0–17.0)
MCH: 29 pg (ref 26.0–34.0)
MCHC: 29.3 g/dL — ABNORMAL LOW (ref 30.0–36.0)
MCV: 98.9 fL (ref 80.0–100.0)
Platelets: 148 10*3/uL — ABNORMAL LOW (ref 150–400)
RBC: 3.66 MIL/uL — ABNORMAL LOW (ref 4.22–5.81)
RDW: 14.8 % (ref 11.5–15.5)
WBC: 6.6 10*3/uL (ref 4.0–10.5)
nRBC: 0 % (ref 0.0–0.2)

## 2020-07-05 LAB — PROTIME-INR
INR: 1.2 (ref 0.8–1.2)
Prothrombin Time: 15.5 seconds — ABNORMAL HIGH (ref 11.4–15.2)

## 2020-07-05 LAB — CBG MONITORING, ED: Glucose-Capillary: 93 mg/dL (ref 70–99)

## 2020-07-05 LAB — TROPONIN I (HIGH SENSITIVITY): Troponin I (High Sensitivity): 44 ng/L — ABNORMAL HIGH (ref <18)

## 2020-07-05 LAB — ECHOCARDIOGRAM COMPLETE: S' Lateral: 4.1 cm

## 2020-07-05 MED ORDER — SODIUM CHLORIDE 0.9% FLUSH
3.0000 mL | Freq: Once | INTRAVENOUS | Status: AC
  Administered 2020-07-05: 3 mL via INTRAVENOUS

## 2020-07-05 MED ORDER — HEPARIN SODIUM (PORCINE) 1000 UNIT/ML DIALYSIS
1000.0000 [IU] | INTRAMUSCULAR | Status: DC | PRN
  Filled 2020-07-05: qty 1

## 2020-07-05 MED ORDER — SODIUM CHLORIDE 0.9 % IV SOLN: 100.0000 mL | INTRAVENOUS | Status: DC | PRN

## 2020-07-05 MED ORDER — ACETAMINOPHEN 160 MG/5ML PO SOLN: 650.0000 mg | ORAL | Status: DC | PRN

## 2020-07-05 MED ORDER — LIDOCAINE HCL (PF) 1 % IJ SOLN: 5.0000 mL | INTRAMUSCULAR | Status: DC | PRN

## 2020-07-05 MED ORDER — STROKE: EARLY STAGES OF RECOVERY BOOK
Freq: Once | Status: AC
Start: 2020-07-05 — End: 2020-07-05
  Filled 2020-07-05: qty 1

## 2020-07-05 MED ORDER — VITAMIN D 25 MCG (1000 UNIT) PO TABS
1000.0000 [IU] | ORAL_TABLET | Freq: Every day | ORAL | Status: DC
  Administered 2020-07-06 – 2020-07-07 (×2): 1000 [IU] via ORAL
  Filled 2020-07-05 (×2): qty 1

## 2020-07-05 MED ORDER — SENNOSIDES-DOCUSATE SODIUM 8.6-50 MG PO TABS: 1.0000 | ORAL_TABLET | Freq: Every evening | ORAL | Status: DC | PRN

## 2020-07-05 MED ORDER — ACETAMINOPHEN 650 MG RE SUPP: 650.0000 mg | RECTAL | Status: DC | PRN

## 2020-07-05 MED ORDER — CALCIUM ACETATE (PHOS BINDER) 667 MG PO CAPS
667.0000 mg | ORAL_CAPSULE | Freq: Two times a day (BID) | ORAL | Status: DC
  Administered 2020-07-05 – 2020-07-07 (×4): 667 mg via ORAL
  Filled 2020-07-05 (×4): qty 1

## 2020-07-05 MED ORDER — HEPARIN SODIUM (PORCINE) 5000 UNIT/ML IJ SOLN
5000.0000 [IU] | Freq: Three times a day (TID) | INTRAMUSCULAR | Status: DC
  Filled 2020-07-05 (×2): qty 1

## 2020-07-05 MED ORDER — APIXABAN 5 MG PO TABS
5.0000 mg | ORAL_TABLET | Freq: Two times a day (BID) | ORAL | Status: DC
  Administered 2020-07-05 – 2020-07-07 (×4): 5 mg via ORAL
  Filled 2020-07-05 (×4): qty 1

## 2020-07-05 MED ORDER — VITAMIN B-12 1000 MCG PO TABS
1000.0000 ug | ORAL_TABLET | Freq: Every day | ORAL | Status: DC
  Administered 2020-07-06 – 2020-07-07 (×2): 1000 ug via ORAL
  Filled 2020-07-05 (×2): qty 1

## 2020-07-05 MED ORDER — CALCITRIOL 0.25 MCG PO CAPS: 0.2500 ug | ORAL_CAPSULE | ORAL | Status: DC

## 2020-07-05 MED ORDER — PENTAFLUOROPROP-TETRAFLUOROETH EX AERO: 1.0000 "application " | INHALATION_SPRAY | CUTANEOUS | Status: DC | PRN

## 2020-07-05 MED ORDER — ATORVASTATIN CALCIUM 40 MG PO TABS
40.0000 mg | ORAL_TABLET | Freq: Every day | ORAL | Status: DC
  Administered 2020-07-05: 40 mg via ORAL
  Filled 2020-07-05: qty 1

## 2020-07-05 MED ORDER — ALTEPLASE 2 MG IJ SOLR
2.0000 mg | Freq: Once | INTRAMUSCULAR | Status: DC | PRN
  Filled 2020-07-05: qty 2

## 2020-07-05 MED ORDER — ACETAMINOPHEN 325 MG PO TABS: 650.0000 mg | ORAL_TABLET | ORAL | Status: DC | PRN

## 2020-07-05 MED ORDER — LIDOCAINE-PRILOCAINE 2.5-2.5 % EX CREA: 1.0000 "application " | TOPICAL_CREAM | CUTANEOUS | Status: DC | PRN

## 2020-07-05 MED ORDER — CLOPIDOGREL BISULFATE 300 MG PO TABS
300.0000 mg | ORAL_TABLET | Freq: Once | ORAL | Status: AC
  Administered 2020-07-05: 300 mg via ORAL
  Filled 2020-07-05: qty 1

## 2020-07-05 MED ORDER — ALBUTEROL SULFATE (2.5 MG/3ML) 0.083% IN NEBU: 2.5000 mg | INHALATION_SOLUTION | Freq: Four times a day (QID) | RESPIRATORY_TRACT | Status: DC | PRN

## 2020-07-05 NOTE — Progress Notes (Signed)
ANTICOAGULATION CONSULT NOTE - Initial Consult  Pharmacy Consult for Apixaban Indication: atrial fibrillation (new)  Allergies  Allergen Reactions  . Aspirin Other (See Comments)    GI irritation   . Hydrochlorothiazide     Dizziness and unable to walk  . Ibuprofen Other (See Comments)    GI irritation     Patient Measurements:    Weight 81.8 kg  Vital Signs: Temp: 98.5 F (36.9 C) (06/03 1956) Temp Source: Oral (06/03 1956) BP: 179/65 (06/03 1956) Pulse Rate: 61 (06/03 1956)  Labs: Recent Labs    07/05/20 1028 07/05/20 1046 07/05/20 1138  HGB 10.6* 11.6*  --   HCT 36.2* 34.0*  --   PLT 148*  --   --   APTT 28  --   --   LABPROT 15.5*  --   --   INR 1.2  --   --   CREATININE 4.47* 4.50*  --   TROPONINIHS  --   --  44*    Estimated Creatinine Clearance: 14.2 mL/min (A) (by C-G formula based on SCr of 4.5 mg/dL (H)).   Medical History: Past Medical History:  Diagnosis Date  . Arthritis    gout  . Asthma    has rescue inhaler  . Cancer [facility])    prostate  . Chronic kidney disease   . GERD (gastroesophageal reflux disease)   . H/O peptic ulcer 1970's  . Herpes 04/28/2020  . Hyperlipidemia   . Hypertension   . Ureteral stone   . Wears dentures   . Wears glasses     Medications:  Medications Prior to Admission  Medication Sig Dispense Refill Last Dose  . acetaminophen (TYLENOL) 500 MG tablet Take 500 mg by mouth every 6 (six) hours as needed for moderate pain.   Past Week at Unknown time  . albuterol (PROVENTIL HFA;VENTOLIN HFA) 108 (90 BASE) MCG/ACT inhaler Inhale 2 puffs into the lungs every 6 (six) hours as needed for wheezing.   07/05/2020 at Unknown time  . allopurinol (ZYLOPRIM) 100 MG tablet Take 100 mg by mouth daily as needed (gout).   unknown  . calcitRIOL (ROCALTROL) 0.25 MCG capsule Take 0.25 mcg by mouth 3 (three) times a week. MWF   07/05/2020 at Unknown time  . calcium acetate (PHOSLO) 667 MG capsule Take 667 mg by mouth 2 (two) times  daily with a meal.   07/05/2020 at Unknown time  . carvedilol (COREG) 3.125 MG tablet Take 1 tablet (3.125 mg total) by mouth See admin instructions. Take 1 tablet in the evenings on Dialysis days (Tues, Thurs, and Sat), On Non-dialysis days take 1 tablet twice a day. 60 tablet 5 07/05/2020 at 0800  . Cholecalciferol 25 MCG (1000 UT) tablet Take 1,000 Units by mouth daily.    07/05/2020 at Unknown time  . Cinnamon 500 MG capsule Take 500 mg by mouth daily.   07/05/2020 at Unknown time  . vitamin B-12 (CYANOCOBALAMIN) 1000 MCG tablet Take 1,000 mcg by mouth daily.   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  Patient on no anticoagulation PTA.  Did not receive tPA for CVA given out of time window.  Neuro has recommended starting apixaban.  Of note, pt ESRD with HD TTS.  Pt currently only has 1 criteria for dose reduction (SCr > 1.5).  In October, when patient turns 80 yrs old, will meet two criteria and eligible  for dose reduction.  Goal of Therapy:  Therapeutic anticoagulation Monitor platelets by anticoagulation protocol: Yes   Plan:  Apixaban 5mg  PO BID TOC consult placed for copay check Monitor for s/sx bleeding   [employer], Pharm.D., BCPS Clinical Pharmacist  **Pharmacist phone directory can be found on [email or web address] listed under [place].  07/05/2020 9:35 PM

## 2020-07-05 NOTE — Progress Notes (Signed)
  Echocardiogram 2D Echocardiogram has been performed.  Taylor Willis F 07/05/2020, 5:32 PM

## 2020-07-05 NOTE — Consult Note (Signed)
[facility] Renal Consultation Note    Indication for Consultation:  Management of ESRD/hemodialysis; anemia, hypertension/volume and secondary hyperparathyroidism  HPI: Taylor Willis is a 80 y.o. male with ESRD on HD. HD started during [facility] admission 04/04/20 PMH also significant for HTN, HLD, gout, h/o prostate cancer. He presented to the ED after a fall at home earlier this week. Fall was due to LE weakness. Family noted progressive weakness and aphasia after the fall.  MRI brain showing large acute cortical/subcortical infarct within the medial left temporal and occipital lobes. Admitted for further evaluation of acute CVA.   Dialyzes TTS at [facility]. Dialysis via [facility]. He also has a functioning LUE AVF. Planning on transition to home dialysis. Had PD catheter placed 06/25/20.  Has been complaint with dialysis sessions.  Seen and examined at bedside. Alert and answers appropriately. Denies cp, sob, n/v.   Past Medical History:  Diagnosis Date  . Arthritis    gout  . Asthma    has rescue inhaler  . Cancer [facility])    prostate  . Chronic kidney disease   . GERD (gastroesophageal reflux disease)   . H/O peptic ulcer 1970's  . Herpes 04/28/2020  . Hyperlipidemia   . Hypertension   . Ureteral stone   . Wears dentures   . Wears glasses    Past Surgical History:  Procedure Laterality Date  . AV FISTULA PLACEMENT Left 02/15/2019   Procedure: ARTERIOVENOUS (AV) FISTULA CREATION LEFT ARM;  Surgeon: Rosetta Posner, MD;  Location: [place];  Service: Vascular;  Laterality: Left;  . CATARACT EXTRACTION W/ INTRAOCULAR LENS  IMPLANT, BILATERAL    . CYSTOSCOPY/RETROGRADE/URETEROSCOPY/STONE EXTRACTION WITH BASKET Right 05/23/2012   Procedure: CYSTOSCOPY/RETROGRADE/URETEROSCOPY/STONE EXTRACTION WITH BASKET;  Surgeon: Bernestine Amass, MD;  Location: [facility];  Service: Urology;  Laterality: Right;  . FRACTURE SURGERY Right    as a child  . HERNIA REPAIR    . HOLMIUM LASER  APPLICATION Right 2/68/3419   Procedure: HOLMIUM LASER APPLICATION;  Surgeon: Bernestine Amass, MD;  Location: [facility];  Service: Urology;  Laterality: Right;  . INSERTION OF DIALYSIS CATHETER Right 04/08/2020   Procedure: INSERTION OF TUNNELED DIALYSIS CATHETER;  Surgeon: Virl Cagey, MD;  Location: [facility];  Service: General;  Laterality: Right;  . LIGATION OF ARTERIOVENOUS  FISTULA Left 09/12/2019   Procedure: SIDE BRANCH LIGATION  OF LEFT ARM ARTERIOVENOUS FISTULA;  Surgeon: Elam Dutch, MD;  Location: [place];  Service: Vascular;  Laterality: Left;  Marland Kitchen MULTIPLE TOOTH EXTRACTIONS    . PERIPHERAL VASCULAR BALLOON ANGIOPLASTY  05/22/2020   Procedure: PERIPHERAL VASCULAR BALLOON ANGIOPLASTY;  Surgeon: Cherre Robins, MD;  Location: [place] [facility];  Service: Cardiovascular;;  Left arm fistula  . REVISON OF ARTERIOVENOUS FISTULA Left 09/12/2019   Procedure: REVISON OF LEFT ARM ARTERIOVENOUS FISTULA;  Surgeon: Elam Dutch, MD;  Location: [place];  Service: Vascular;  Laterality: Left;  . ROBOT ASSISTED LAPAROSCOPIC RADICAL PROSTATECTOMY  2011  . TONSILLECTOMY     No family history on file. Social History:  reports that he quit smoking about 38 years ago. His smoking use included cigarettes. He has never used smokeless tobacco. He reports previous alcohol use of about 1.0 standard drink of alcohol per week. He reports that he does not use drugs. Allergies  Allergen Reactions  . Aspirin Other (See Comments)    GI irritation   . Hydrochlorothiazide     Dizziness and unable to  walk  . Ibuprofen Other (See Comments)    GI irritation    Prior to Admission medications   Medication Sig Start Date End Date Taking? Authorizing Provider  acetaminophen (TYLENOL) 500 MG tablet Take 500 mg by mouth every 6 (six) hours as needed for moderate pain.   Yes [provider]  albuterol (PROVENTIL HFA;VENTOLIN HFA) 108 (90 BASE) MCG/ACT inhaler Inhale 2 puffs into the  lungs every 6 (six) hours as needed for wheezing.   Yes [provider]  allopurinol (ZYLOPRIM) 100 MG tablet Take 100 mg by mouth daily as needed (gout). 12/04/19  Yes [provider]  calcitRIOL (ROCALTROL) 0.25 MCG capsule Take 0.25 mcg by mouth 3 (three) times a week. MWF 03/21/20  Yes [provider]  calcium acetate (PHOSLO) 667 MG capsule Take 667 mg by mouth 2 (two) times daily with a meal.   Yes [provider]  carvedilol (COREG) 3.125 MG tablet Take 1 tablet (3.125 mg total) by mouth See admin instructions. Take 1 tablet in the evenings on Dialysis days (Tues, Thurs, and Sat), On Non-dialysis days take 1 tablet twice a day. 04/29/20  Yes Roxan Hockey, MD  Cholecalciferol 25 MCG (1000 UT) tablet Take 1,000 Units by mouth daily.    Yes [provider]  Cinnamon 500 MG capsule Take 500 mg by mouth daily.   Yes [provider]  vitamin B-12 (CYANOCOBALAMIN) 1000 MCG tablet Take 1,000 mcg by mouth daily.   Yes [provider]   Current Facility-Administered Medications  Medication Dose Route Frequency Provider Last Rate Last Admin  .  stroke: mapping our early stages of recovery book   Does not apply Once Mosetta Anis, MD      . acetaminophen (TYLENOL) tablet 650 mg  650 mg Oral Q4H PRN Mosetta Anis, MD       Or  . acetaminophen (TYLENOL) 160 MG/5ML solution 650 mg  650 mg Per Tube Q4H PRN Mosetta Anis, MD       Or  . acetaminophen (TYLENOL) suppository 650 mg  650 mg Rectal Q4H PRN Mosetta Anis, MD      . albuterol (PROVENTIL) (2.5 MG/3ML) 0.083% nebulizer solution 2.5 mg  2.5 mg Inhalation Q6H PRN Mosetta Anis, MD      . Derrill Memo ON 07/08/2020] calcitRIOL (ROCALTROL) capsule 0.25 mcg  0.25 mcg Oral Once per day on Mon Wed Fri Mosetta Anis, MD      . calcium acetate (PHOSLO) capsule 667 mg  667 mg Oral BID WC Mosetta Anis, MD      . Derrill Memo ON 07/06/2020] cholecalciferol (VITAMIN D3) tablet 1,000 Units  1,000 Units Oral Daily  Mosetta Anis, MD      . heparin injection 5,000 Units  5,000 Units Subcutaneous Q8H Mosetta Anis, MD      . senna-docusate (Senokot-S) tablet 1 tablet  1 tablet Oral QHS PRN Mosetta Anis, MD      . sodium chloride flush (NS) 0.9 % injection 3 mL  3 mL Intravenous Once Mosetta Anis, MD      . Derrill Memo ON 07/06/2020] vitamin B-12 (CYANOCOBALAMIN) tablet 1,000 mcg  1,000 mcg Oral Daily Mosetta Anis, MD         ROS: As per HPI otherwise negative.  Physical Exam: Vitals:   07/05/20 1145 07/05/20 1215 07/05/20 1230 07/05/20 1245  BP: (!) 157/73 (!) 162/74 (!) 165/76 (!) 161/71  Pulse: (!) 53 (!) 54 (!) 56  60  Resp: 20 19 20 20   Temp:      SpO2: 100% 100% 98% 99%     General: WDWN elderly man, nad  Head: NCAT sclera not icteric MMM Neck: Supple. No JVD appreciated  Lungs: CTA bilaterally without wheezes, rales, or rhonchi. Breathing is unlabored. Heart: RRR with S1 S2 Abdomen: soft non -tender  Lower extremities:without edema or ischemic changes, no open wounds  Neuro: A & O  X 3. Moves all extremities spontaneously. Psych:  Responds to questions appropriately with a normal affect. Dialysis Access: LUE AVF +bruit   Labs: Basic Metabolic Panel: Recent Labs  Lab 07/05/20 1028 07/05/20 1046  NA 139 140  K 3.1* 3.4*  CL 102 99  CO2 29  --   GLUCOSE 98 98  BUN 19 24*  CREATININE 4.47* 4.50*  CALCIUM 8.7*  --    Liver Function Tests: Recent Labs  Lab 07/05/20 1028  AST 20  ALT 8  ALKPHOS 78  BILITOT 0.5  PROT 6.7  ALBUMIN 2.9*   No results for input(s): LIPASE, AMYLASE in the last 168 hours. No results for input(s): AMMONIA in the last 168 hours. CBC: Recent Labs  Lab 07/05/20 1028 07/05/20 1046  WBC 6.6  --   NEUTROABS 4.5  --   HGB 10.6* 11.6*  HCT 36.2* 34.0*  MCV 98.9  --   PLT 148*  --    Cardiac Enzymes: No results for input(s): CKTOTAL, CKMB, CKMBINDEX, TROPONINI in the last 168 hours. CBG: Recent Labs  Lab 07/05/20 1042  GLUCAP 93   Iron  Studies: No results for input(s): IRON, TIBC, TRANSFERRIN, FERRITIN in the last 72 hours. Studies/Results: CT HEAD WO CONTRAST  Result Date: 07/05/2020 CLINICAL DATA:  Increased falls at home for 4 days with confusion and expressive aphasia. Neuro deficit, acute. Stroke suspected. EXAM: CT HEAD WITHOUT CONTRAST TECHNIQUE: Contiguous axial images were obtained from the base of the skull through the vertex without intravenous contrast. COMPARISON:  CT head 04/29/2020. FINDINGS: Brain: There is new ill-defined density medially in the left temporal lobe, extending posteriorly into the left occipital lobe white matter. The overlying cortex appears intact. There is no evidence of acute intracranial hemorrhage, extra-axial fluid collection or midline shift. Patchy low-density in the periventricular white matter is otherwise stable, most consistent with chronic small vessel ischemic change. No hydrocephalus. Vascular: Intracranial vascular calcifications. No hyperdense vessel identified. Skull: Negative for fracture or focal lesion. Sinuses/Orbits: The visualized paranasal sinuses and mastoid air cells are clear. No orbital abnormalities are seen. Other: Previous bilateral lens surgery. IMPRESSION: 1. New ill-defined low-density medially in the left temporal and occipital lobes, most consistent with a subacute nonhemorrhagic stroke. Encephalitis considered less likely. Correlate clinically and consider MRI for further evaluation. 2. No evidence of acute intracranial hemorrhage, midline shift or hydrocephalus. 3. Otherwise stable chronic small vessel ischemic changes Electronically Signed   By: Richardean Sale M.D.   On: 07/05/2020 12:10   MR ANGIO HEAD WO CONTRAST  Addendum Date: 07/05/2020   ADDENDUM REPORT: 07/05/2020 16:05 ADDENDUM: These results were called by telephone at the time of interpretation on 07/05/2020 at 4:05 pm to provider Dr. Truman Hayward, who verbally acknowledged these results. Electronically Signed   By:  Kellie Simmering DO   On: 07/05/2020 16:05   Result Date: 07/05/2020 CLINICAL DATA:  Neuro deficit, acute, stroke suspected. EXAM: MRI HEAD WITHOUT CONTRAST MRA HEAD WITHOUT CONTRAST MRA NECK WITHOUT CONTRAST TECHNIQUE: Multiplanar, multi-echo pulse sequences of the brain and surrounding structures  were acquired without intravenous contrast. Angiographic images of the Circle of Willis were acquired using MRA technique without intravenous contrast. Angiographic images of the neck were acquired using MRA technique without intravenous contrast. Carotid stenosis measurements (when applicable) are obtained utilizing NASCET criteria, using the distal internal carotid diameter as the denominator. COMPARISON:  Noncontrast head CT performed earlier today 07/05/2020. FINDINGS: MRI HEAD FINDINGS Brain: Mild intermittent motion degradation. Mild cerebral and cerebellar atrophy. Large acute cortical/subcortical infarct within the medial left temporal and occipital lobes as well as dorsal lateral left thalamus (left PCA vascular territory). Small foci of petechial hemorrhage are present within the infarction territory. Additional patchy small acute infarcts are present within the inferior right cerebellar hemisphere (PICA territory). A subtle focus of apparent diffusion weighted hyperintensity within the medial left cerebellar hemisphere is favored artifactual. Mild-to-moderate multifocal T2/FLAIR hyperintensity within the cerebral white matter, nonspecific but compatible with chronic small vessel ischemic disease. No evidence of intracranial mass. No extra-axial fluid collection. No midline shift. Vascular: Separately reported on concurrently performed MRA head. Skull and upper cervical spine: No focal marrow lesion. Sinuses/Orbits: Visualized orbits show no acute finding. Bilateral lens replacements. Trace bilateral ethmoid sinus mucosal thickening. Small mucous retention cyst within the posterosuperior right maxillary sinus. MRA  HEAD FINDINGS Anterior circulation: The intracranial internal carotid arteries are patent. The M1 middle cerebral arteries are patent. No M2 proximal branch occlusion or high-grade proximal stenosis is identified. The anterior cerebral arteries are patent. No intracranial aneurysm is identified. Posterior circulation: The intracranial vertebral arteries are patent. Flow related signal is not definitively identified within the right PICA. The basilar artery is patent. The left posterior cerebral artery is poorly delineated beyond the P3/P4 junction, likely occluded. The right posterior cerebral artery is patent. Posterior communicating arteries are hypoplastic or absent bilaterally. Anatomic variants: As described MRA NECK FINDINGS Aortic arch: The aortic arch, common carotid artery origins, right innominate artery and very proximal subclavian arteries are excluded from the field of view. No hemodynamically significant stenosis within the visible subclavian arteries. Right carotid system: The visible common carotid and internal carotid arteries are patent within the neck without hemodynamically significant stenosis (50% or greater). Mild atherosclerotic plaque about the carotid bifurcation and within the proximal ICA. Left carotid system: The visible common carotid and internal carotid arteries are patent within the neck without hemodynamically significant stenosis (50% or greater). Mild atherosclerotic plaque about the carotid bifurcation and within the proximal ICA. Vertebral arteries: The vertebral arteries are codominant and patent with antegrade flow and without appreciable stenosis. IMPRESSION: MRI head: 1. Large acute cortical/subcortical infarct within the medial left temporal and occipital lobes, as well as dorsolateral left thalamus (left PCA vascular territory). Small foci of associated petechial hemorrhage. 2. Additional small patchy acute infarcts within the inferior right cerebellar hemisphere (PICA  territory). 3. Mild-to-moderate cerebral white matter chronic small vessel ischemic disease. 4. Mild generalized parenchymal atrophy. MRA head: 1. The left posterior cerebral artery is poorly delineated beyond the P3/P4 junction, likely occluded. 2. Flow related signal is not definitively identified within the right PICA. 3. Elsewhere, no intracranial large vessel occlusion or proximal high-grade arterial stenosis is identified. MRA neck: 1. The aortic arch, common carotid artery origins, right innominate artery and very proximal subclavian arteries are excluded from the field of view. 2. The visible common carotid and internal carotid arteries are patent within the neck without hemodynamically significant stenosis. Mild atherosclerotic plaque about the carotid bifurcations and within the proximal internal carotid arteries, bilaterally. 3. Vertebral  arteries patent within the neck with antegrade flow and without stenosis. Electronically Signed: By: Kellie Simmering DO On: 07/05/2020 15:53   MR ANGIO NECK WO CONTRAST  Addendum Date: 07/05/2020   ADDENDUM REPORT: 07/05/2020 16:05 ADDENDUM: These results were called by telephone at the time of interpretation on 07/05/2020 at 4:05 pm to provider Dr. Truman Hayward, who verbally acknowledged these results. Electronically Signed   By: Kellie Simmering DO   On: 07/05/2020 16:05   Result Date: 07/05/2020 CLINICAL DATA:  Neuro deficit, acute, stroke suspected. EXAM: MRI HEAD WITHOUT CONTRAST MRA HEAD WITHOUT CONTRAST MRA NECK WITHOUT CONTRAST TECHNIQUE: Multiplanar, multi-echo pulse sequences of the brain and surrounding structures were acquired without intravenous contrast. Angiographic images of the Circle of Willis were acquired using MRA technique without intravenous contrast. Angiographic images of the neck were acquired using MRA technique without intravenous contrast. Carotid stenosis measurements (when applicable) are obtained utilizing NASCET criteria, using the distal internal  carotid diameter as the denominator. COMPARISON:  Noncontrast head CT performed earlier today 07/05/2020. FINDINGS: MRI HEAD FINDINGS Brain: Mild intermittent motion degradation. Mild cerebral and cerebellar atrophy. Large acute cortical/subcortical infarct within the medial left temporal and occipital lobes as well as dorsal lateral left thalamus (left PCA vascular territory). Small foci of petechial hemorrhage are present within the infarction territory. Additional patchy small acute infarcts are present within the inferior right cerebellar hemisphere (PICA territory). A subtle focus of apparent diffusion weighted hyperintensity within the medial left cerebellar hemisphere is favored artifactual. Mild-to-moderate multifocal T2/FLAIR hyperintensity within the cerebral white matter, nonspecific but compatible with chronic small vessel ischemic disease. No evidence of intracranial mass. No extra-axial fluid collection. No midline shift. Vascular: Separately reported on concurrently performed MRA head. Skull and upper cervical spine: No focal marrow lesion. Sinuses/Orbits: Visualized orbits show no acute finding. Bilateral lens replacements. Trace bilateral ethmoid sinus mucosal thickening. Small mucous retention cyst within the posterosuperior right maxillary sinus. MRA HEAD FINDINGS Anterior circulation: The intracranial internal carotid arteries are patent. The M1 middle cerebral arteries are patent. No M2 proximal branch occlusion or high-grade proximal stenosis is identified. The anterior cerebral arteries are patent. No intracranial aneurysm is identified. Posterior circulation: The intracranial vertebral arteries are patent. Flow related signal is not definitively identified within the right PICA. The basilar artery is patent. The left posterior cerebral artery is poorly delineated beyond the P3/P4 junction, likely occluded. The right posterior cerebral artery is patent. Posterior communicating arteries are  hypoplastic or absent bilaterally. Anatomic variants: As described MRA NECK FINDINGS Aortic arch: The aortic arch, common carotid artery origins, right innominate artery and very proximal subclavian arteries are excluded from the field of view. No hemodynamically significant stenosis within the visible subclavian arteries. Right carotid system: The visible common carotid and internal carotid arteries are patent within the neck without hemodynamically significant stenosis (50% or greater). Mild atherosclerotic plaque about the carotid bifurcation and within the proximal ICA. Left carotid system: The visible common carotid and internal carotid arteries are patent within the neck without hemodynamically significant stenosis (50% or greater). Mild atherosclerotic plaque about the carotid bifurcation and within the proximal ICA. Vertebral arteries: The vertebral arteries are codominant and patent with antegrade flow and without appreciable stenosis. IMPRESSION: MRI head: 1. Large acute cortical/subcortical infarct within the medial left temporal and occipital lobes, as well as dorsolateral left thalamus (left PCA vascular territory). Small foci of associated petechial hemorrhage. 2. Additional small patchy acute infarcts within the inferior right cerebellar hemisphere (PICA territory). 3. Mild-to-moderate cerebral  white matter chronic small vessel ischemic disease. 4. Mild generalized parenchymal atrophy. MRA head: 1. The left posterior cerebral artery is poorly delineated beyond the P3/P4 junction, likely occluded. 2. Flow related signal is not definitively identified within the right PICA. 3. Elsewhere, no intracranial large vessel occlusion or proximal high-grade arterial stenosis is identified. MRA neck: 1. The aortic arch, common carotid artery origins, right innominate artery and very proximal subclavian arteries are excluded from the field of view. 2. The visible common carotid and internal carotid arteries are  patent within the neck without hemodynamically significant stenosis. Mild atherosclerotic plaque about the carotid bifurcations and within the proximal internal carotid arteries, bilaterally. 3. Vertebral arteries patent within the neck with antegrade flow and without stenosis. Electronically Signed: By: Kellie Simmering DO On: 07/05/2020 15:53   MR BRAIN WO CONTRAST  Addendum Date: 07/05/2020   ADDENDUM REPORT: 07/05/2020 16:05 ADDENDUM: These results were called by telephone at the time of interpretation on 07/05/2020 at 4:05 pm to provider Dr. Truman Hayward, who verbally acknowledged these results. Electronically Signed   By: Kellie Simmering DO   On: 07/05/2020 16:05   Result Date: 07/05/2020 CLINICAL DATA:  Neuro deficit, acute, stroke suspected. EXAM: MRI HEAD WITHOUT CONTRAST MRA HEAD WITHOUT CONTRAST MRA NECK WITHOUT CONTRAST TECHNIQUE: Multiplanar, multi-echo pulse sequences of the brain and surrounding structures were acquired without intravenous contrast. Angiographic images of the Circle of Willis were acquired using MRA technique without intravenous contrast. Angiographic images of the neck were acquired using MRA technique without intravenous contrast. Carotid stenosis measurements (when applicable) are obtained utilizing NASCET criteria, using the distal internal carotid diameter as the denominator. COMPARISON:  Noncontrast head CT performed earlier today 07/05/2020. FINDINGS: MRI HEAD FINDINGS Brain: Mild intermittent motion degradation. Mild cerebral and cerebellar atrophy. Large acute cortical/subcortical infarct within the medial left temporal and occipital lobes as well as dorsal lateral left thalamus (left PCA vascular territory). Small foci of petechial hemorrhage are present within the infarction territory. Additional patchy small acute infarcts are present within the inferior right cerebellar hemisphere (PICA territory). A subtle focus of apparent diffusion weighted hyperintensity within the medial left  cerebellar hemisphere is favored artifactual. Mild-to-moderate multifocal T2/FLAIR hyperintensity within the cerebral white matter, nonspecific but compatible with chronic small vessel ischemic disease. No evidence of intracranial mass. No extra-axial fluid collection. No midline shift. Vascular: Separately reported on concurrently performed MRA head. Skull and upper cervical spine: No focal marrow lesion. Sinuses/Orbits: Visualized orbits show no acute finding. Bilateral lens replacements. Trace bilateral ethmoid sinus mucosal thickening. Small mucous retention cyst within the posterosuperior right maxillary sinus. MRA HEAD FINDINGS Anterior circulation: The intracranial internal carotid arteries are patent. The M1 middle cerebral arteries are patent. No M2 proximal branch occlusion or high-grade proximal stenosis is identified. The anterior cerebral arteries are patent. No intracranial aneurysm is identified. Posterior circulation: The intracranial vertebral arteries are patent. Flow related signal is not definitively identified within the right PICA. The basilar artery is patent. The left posterior cerebral artery is poorly delineated beyond the P3/P4 junction, likely occluded. The right posterior cerebral artery is patent. Posterior communicating arteries are hypoplastic or absent bilaterally. Anatomic variants: As described MRA NECK FINDINGS Aortic arch: The aortic arch, common carotid artery origins, right innominate artery and very proximal subclavian arteries are excluded from the field of view. No hemodynamically significant stenosis within the visible subclavian arteries. Right carotid system: The visible common carotid and internal carotid arteries are patent within the neck without hemodynamically significant stenosis (50%  or greater). Mild atherosclerotic plaque about the carotid bifurcation and within the proximal ICA. Left carotid system: The visible common carotid and internal carotid arteries are  patent within the neck without hemodynamically significant stenosis (50% or greater). Mild atherosclerotic plaque about the carotid bifurcation and within the proximal ICA. Vertebral arteries: The vertebral arteries are codominant and patent with antegrade flow and without appreciable stenosis. IMPRESSION: MRI head: 1. Large acute cortical/subcortical infarct within the medial left temporal and occipital lobes, as well as dorsolateral left thalamus (left PCA vascular territory). Small foci of associated petechial hemorrhage. 2. Additional small patchy acute infarcts within the inferior right cerebellar hemisphere (PICA territory). 3. Mild-to-moderate cerebral white matter chronic small vessel ischemic disease. 4. Mild generalized parenchymal atrophy. MRA head: 1. The left posterior cerebral artery is poorly delineated beyond the P3/P4 junction, likely occluded. 2. Flow related signal is not definitively identified within the right PICA. 3. Elsewhere, no intracranial large vessel occlusion or proximal high-grade arterial stenosis is identified. MRA neck: 1. The aortic arch, common carotid artery origins, right innominate artery and very proximal subclavian arteries are excluded from the field of view. 2. The visible common carotid and internal carotid arteries are patent within the neck without hemodynamically significant stenosis. Mild atherosclerotic plaque about the carotid bifurcations and within the proximal internal carotid arteries, bilaterally. 3. Vertebral arteries patent within the neck with antegrade flow and without stenosis. Electronically Signed: By: Kellie Simmering DO On: 07/05/2020 15:53    Dialysis Orders: [facility] TTS. Unable to reach today.   Assessment/Plan: 1. Acute L PCA CVA - w/u per primary/neuro  2. New onset AFib  - Bradycardic on admission. Holding BB --per primary  3. ESRD -  HD TTS. Continue on schedule. Next HD 6/4. Added K+ bath  4. Hypertension/volume  - BP up. Permissive HTN  in the setting of CVA.  5. Anemia  - Hgb 11.6. No ESA needs  6. Metabolic bone disease -  Ca ok. Continue home binders, calcitriol TIW.  7. Nutrition - NPO at present. Liberlize diet when taking PO.   Lynnda Child PA-C [facility] 07/05/2020, 4:10 PM

## 2020-07-05 NOTE — ED Provider Notes (Signed)
I provided a substantive portion of the care of this patient.  I personally performed the entirety of the medical decision making for this encounter.  EKG Interpretation  Date/Time:  Friday July 05 2020 10:18:53 EDT Ventricular Rate:  54 PR Interval:    QRS Duration: 94 QT Interval:  466 QTC Calculation: 441 R Axis:   29 Text Interpretation: Atrial fibrillation with slow ventricular response ST & T wave abnormality, consider inferior ischemia ST & T wave abnormality, consider anterolateral ischemia Abnormal ECG Confirmed by Lacretia Leigh ([id number]) on 07/05/2020 11:63:76 AM  80 year old male presents due to increasing trouble with his gait as well as periods of confusion.  On exam here, he is alert and talkative.  Neuro exam shows good strength in all extremities.  Will order head CT and likely MRI and probably admit   Lacretia Leigh, MD 07/05/20 1129

## 2020-07-05 NOTE — H&P (Addendum)
Date: 07/05/2020               Patient Name:  Taylor Willis MRN: [id number]  DOB: 10-04-1940 Age / Sex: 80 y.o., male   PCP: [name], MD         Medical Service: Internal Medicine Teaching Service         Attending Physician: Dr. Lacretia Leigh, MD    First Contact: Dr. Armando Reichert Pager: [phone]  Second Contact: Dr. Court Joy Pager: [phone]       After Hours (After 5p/  First Contact Pager: [phone]  weekends / holidays): Second Contact Pager: [phone]   Chief Complaint: Leg weakness  History of Present Illness:  Mr.Prospero is a 80 yo M w/ PMH of ESRD on TuThSa dialysis, HTN, HLD prostate cancer s/p prostectomy 10 years prior, 1st degree AV block, and asthma presenting to [facility] w/ complaint of lower extremity weakness. He is evaluated with his wife present who provides additional history. He was in his usual state of health until this Monday when he fell due to acute onset leg weakness. As he feel he hit his face somewhere and next thing he knew, he was on the floor. His wife mentions that after the incident, he was noted to have difficulty with word finding that appeared to progressively worsen throughout the week so he was brought to ED for evaluation.  On review of systems, he denies any fevers, chills, nausea, vomiting, chest pain, palpitations, dyspnea. He denies any blurry vision, numbness, tingling.  On chart review, he was noted to have admission in March for encephalopathy where he was found to be uremic from ESRD and was started on dialysis. She mentions that he regularly attends dialysis sessions and has not missed any sessions. He is currently getting dialysis through tunneled cath. He recently had peritoneal access placed to transition to home dialysis.  Meds: Current Meds  Medication Sig  . acetaminophen (TYLENOL) 500 MG tablet Take 500 mg by mouth every 6 (six) hours as needed for moderate pain.  Marland Kitchen albuterol (PROVENTIL HFA;VENTOLIN HFA) 108 (90 BASE) MCG/ACT  inhaler Inhale 2 puffs into the lungs every 6 (six) hours as needed for wheezing.  Marland Kitchen allopurinol (ZYLOPRIM) 100 MG tablet Take 100 mg by mouth daily as needed (gout).  . calcitRIOL (ROCALTROL) 0.25 MCG capsule Take 0.25 mcg by mouth 3 (three) times a week. MWF  . calcium acetate (PHOSLO) 667 MG capsule Take 667 mg by mouth 2 (two) times daily with a meal.  . carvedilol (COREG) 3.125 MG tablet Take 1 tablet (3.125 mg total) by mouth See admin instructions. Take 1 tablet in the evenings on Dialysis days (Tues, Thurs, and Sat), On Non-dialysis days take 1 tablet twice a day.  . Cholecalciferol 25 MCG (1000 UT) tablet Take 1,000 Units by mouth daily.   . Cinnamon 500 MG capsule Take 500 mg by mouth daily.  . vitamin B-12 (CYANOCOBALAMIN) 1000 MCG tablet Take 1,000 mcg by mouth daily.   Allergies: Allergies as of 07/05/2020 - Review Complete 07/05/2020  Allergen Reaction Noted  . Aspirin Other (See Comments) 06/03/2017  . Hydrochlorothiazide  07/15/2018  . Ibuprofen Other (See Comments) 06/03/2017   Past Medical History:  Diagnosis Date  . Arthritis    gout  . Asthma    has rescue inhaler  . Cancer [facility])    prostate  . Chronic kidney disease   . GERD (gastroesophageal reflux disease)   . H/O peptic ulcer 1970's  .  Herpes 04/28/2020  . Hyperlipidemia   . Hypertension   . Ureteral stone   . Wears dentures   . Wears glasses    Family History:  Unknown. Parents and children all passed at early age from car accidents  Social History:  Lives with wife. Used to work in [employer]. Denies current smoking, alcohol, illicit substance use.  Review of Systems: A complete ROS was negative except as per HPI.  Physical Exam: Blood pressure (!) 157/73, pulse (!) 53, temperature 98.2 F (36.8 C), resp. rate 20, SpO2 100 %.  Gen: Well-developed, well nourished, NAD HEENT: NCAT head, hearing intact, EOMI, MMM Neck: supple, ROM intact CV: Bradycardic, S1s2 wnl Pulm: CTAB, No rales, no  wheezes Abd: Soft, BS+, NTND, peritoneal access cath intact Extm: ROM intact, Peripheral pulses intact, tunneled dialysis cath on R intact, Left AVF covered with bandaging Skin: Well-healing wound on nasal bridge Neurologic exam: Mental status: A&Ox3, + expressive aphasia Cranial Nerves:             II: PERRL             III, IV, VI: Extra-occular motions intact bilaterally             V, VII: Face symmetric, sensation intact in all 3 divisions               VIII: hearing normal to rubbing fingers bilaterally               IX, X: palate rises symmetrically             XI: Head turn and shoulder shrug normal bilaterally               XII: tongue midline    Motor: Strength 5/5 RUE, 5/5 LUE, 2/5 RLE, 3/5 LLE, bulk muscle and tone are normal Sensory: Light touch intact and symmetric bilaterally  Coordination: There is no dysmetria on finger-to-nose. Rapid alternating movement test normal. Psychiatric: Normal mood and affect  EKG: personally reviewed my interpretation is irregularly irregular, T wave inversions on multiple leads similar to prior EKG  CXR: N/A  Assessment & Plan by Problem: Active Problems:   * No active hospital problems. *  Mr.Pangallo is a 80 yo M w/ PMH of ESRD on TuThSa dialysis, HTN, HLD prostate cancer s/p prostectomy 10 years prior, 1st degree AV block, and asthma presenting with lower extremity weakness and expressive aphasia. Admission for stroke work-up. Incidentally also found to be in new onset A.fib which may suggest etiology of stroke.  Lower extremity weakness, expressive aphasia 2/2 subacute L temporal stroke vs mass CT head with ill-defined lesion noted on left temporal/occipital lobes. Neuro exam with focal neurologic deficits. No prior hx of stroke. Also incidental finding of A.fib on EKG. Other risk factors include prior hx of malignancy, ESRD. - F/u MRI Brain, MRA head/neck - Neurology consult if MRI confirm stroke, Neurosurgery consult if Mri shows  mass - Allow for permissive HTN in the setting of possible CVA (systolic < 790 and diastolic < 240) - Echocardiogram - A1C, Lipid panel  - Tele monitoring  - SLP/PT/OT - Hold off on anti-platelet therapy until stroke is confirmed  New Onset Atrial Fib 1st Degree AV block On carvedilol at home. Chart review shows prior EKG w/ A.fib but not addressed. Does not see cardiology. CHADSVASC2 score of 3. Will need anti-coagulation. Current HR 45. - Hold home carvedilol until bradycardia improves - F/u Echo - Start eliquis 2.5mg  BID if MRI does  not show any micro-hemorrhage - Telemetry  End Stage Renal Disease Recently diagnosed in 04/2020. Currently getting tu/thu/sa dialysis sessions. Currently not uremic. K 3.1.  - Consult to nephrology - Trend electrolytes  DVT prophx: subqheparin Diet: NPO til S&S Bowel: Senokot Code: Full  Prior to Admission Living Arrangement: Home Anticipated Discharge Location: SNF Barriers to Discharge: Medical work-up  Dispo: Admit patient to Inpatient with expected length of stay greater than 2 midnights.  Signed: Mosetta Anis, MD 07/05/2020, 2:20 PM Pager: [phone] After 5pm on weekdays and 1pm on weekends: On Call Pager: [phone]

## 2020-07-05 NOTE — Evaluation (Signed)
Occupational Therapy Evaluation Patient Details Name: Taylor Willis MRN: [id number] DOB: 1940-05-13 Today's Date: 07/05/2020    History of Present Illness 80 y.o. male with ESRD on HD. HD started during [facility] admission 04/04/20 PMH also significant for HTN, HLD, gout, h/o prostate cancer. He presented after a fall at home earlier this week. Fall was due to LE weakness. Family noted progressive weakness and aphasia after the fall.  MRI brain showing large acute cortical/subcortical infarct within the medial left temporal and occipital lobes.   Clinical Impression   Patient admitted for the diagnosis above.  PTA he lives with his spouse, who is able to assist him with meals, meds, and home management, but may struggle if he needs assist with transfers and mobility.  Barriers are listed below.  Currently, he is needing Min A for basic transfers and mobility, and up to mod A for ADL completion due to balance issues.  The patient and spouse wish to aim for home with continued home health.  OT will follow in the acute setting to maximize functional status.      Follow Up Recommendations  Home health OT    Equipment Recommendations  None recommended by OT    Recommendations for Other Services       Precautions / Restrictions Precautions Precautions: Fall Restrictions Weight Bearing Restrictions: No Other Position/Activity Restrictions: Skin tear from fall to R forearm.      Mobility Bed Mobility Overal bed mobility: Needs Assistance Bed Mobility: Supine to Sit     Supine to sit: Min assist       Patient Response: Cooperative  Transfers Overall transfer level: Needs assistance Equipment used: Rolling walker (2 wheeled) Transfers: Sit to/from [facility] Sit to Stand: Min guard Stand pivot transfers: Min assist            Balance Overall balance assessment: Needs assistance Sitting-balance support: Feet supported Sitting balance-Leahy Scale: Good      Standing balance support: Bilateral upper extremity supported Standing balance-Leahy Scale: Poor Standing balance comment: needs RW, noted posterior lean and difficulty with transitional turns.                           ADL either performed or assessed with clinical judgement   ADL Overall ADL's : Needs assistance/impaired     Grooming: Wash/dry hands;Minimal assistance;Standing           Upper Body Dressing : Minimal assistance;Sitting   Lower Body Dressing: Minimal assistance;Sit to/from stand   Toilet Transfer: Minimal assistance;RW           Functional mobility during ADLs: Minimal assistance;Rolling walker       Vision Baseline Vision/History: Wears glasses Wears Glasses: At all times Vision Assessment?: Vision impaired- to be further tested in functional context Additional Comments: patient is very inconsistent with visual field testing.  ? possible field cut to L, but struggled with testing to the R.  Is running into objects on the L, able to scan L and R in the halls for room numbers.     Perception     Praxis      Pertinent Vitals/Pain Pain Assessment: No/denies pain     Hand Dominance Right   Extremity/Trunk Assessment Upper Extremity Assessment Upper Extremity Assessment: Overall WFL for tasks assessed (MMT is pretty equal to both UE's, coordination is slowed, but intact.)   Lower Extremity Assessment Lower Extremity Assessment: Defer to PT evaluation (difficulty with dorsiflexion to B  LE's)   Cervical / Trunk Assessment Cervical / Trunk Assessment: Kyphotic   Communication Communication Communication: Expressive difficulties   Cognition Arousal/Alertness: Awake/alert Behavior During Therapy: WFL for tasks assessed/performed Overall Cognitive Status: Impaired/Different from baseline Area of Impairment: Following commands;Memory;Safety/judgement                     Memory: Decreased short-term memory Following Commands:  Follows one step commands consistently;Follows multi-step commands with increased time Safety/Judgement: Decreased awareness of deficits     General Comments: Word finding impacts answeres to orientation questions - airport for place, but knew he was at the hospital.   General Comments       Exercises     Shoulder Instructions      Home Living Family/patient expects to be discharged to:: Private residence Living Arrangements: Spouse/significant other Available Help at Discharge: Family;Available 24 hours/day Type of Home: House Home Access: Stairs to enter [employer] of Steps: 2 Entrance Stairs-Rails: None Home Layout: Multi-level;Bed/bath upstairs (Tri level home with finished basement down, and bedrooms up.  Kitchen, den on main level.  Enter from [place] on [place] level.) Alternate Level Stairs-Number of Steps: 6-7 Alternate Level Stairs-Rails: Right Bathroom Shower/Tub: Occupational psychologist: Standard     Home Equipment: Clinical cytogeneticist - 2 wheels          Prior Functioning/Environment Level of Independence: Needs assistance  Gait / Transfers Assistance Needed: Spouse walks beside him at all times with a RW ADL's / Homemaking Assistance Needed: patient able to bathe and dress himslef with dist supervision.  Sink baths. Communication / Swallowing Assistance Needed: Mild word finding noted Comments: Spouse assists with community mobility, meds, meals and IADL        OT Problem List: Impaired balance (sitting and/or standing);Impaired vision/perception      OT Treatment/Interventions: Self-care/ADL training;Balance training;DME and/or AE instruction;Visual/perceptual remediation/compensation;Therapeutic activities    OT Goals(Current goals can be found in the care plan section) Acute Rehab OT Goals Patient Stated Goal: To return home and restart HH OT and PT OT Goal Formulation: With patient Time For Goal Achievement: 07/19/20 Potential  to Achieve Goals: Fair ADL Goals Pt Will Perform Grooming: with set-up;sitting;standing Pt Will Perform Upper Body Bathing: with set-up;sitting Pt Will Perform Lower Body Bathing: with supervision;sit to/from stand Pt Will Perform Upper Body Dressing: with set-up;sitting Pt Will Perform Lower Body Dressing: with supervision;sit to/from stand Pt Will Transfer to Toilet: with supervision;ambulating;regular height toilet Pt Will Perform Toileting - Clothing Manipulation and hygiene: with supervision;sit to/from stand  OT Frequency: Min 2X/week   Barriers to D/C:    none noted       Co-evaluation              AM-PAC OT "6 Clicks" Daily Activity     Outcome Measure Help from another person eating meals?: None Help from another person taking care of personal grooming?: A Little Help from another person toileting, which includes using toliet, bedpan, or urinal?: A Little Help from another person bathing (including washing, rinsing, drying)?: A Lot Help from another person to put on and taking off regular upper body clothing?: A Little Help from another person to put on and taking off regular lower body clothing?: A Lot 6 Click Score: 17   End of Session Equipment Utilized During Treatment: Gait belt;Rolling walker Nurse Communication: Mobility status  Activity Tolerance: Patient tolerated treatment well Patient left: in chair;with call bell/phone within reach;with chair alarm set;with family/visitor present  OT Visit Diagnosis: Unsteadiness on feet (R26.81);Muscle weakness (generalized) (M62.81);History of falling (Z91.81)                Time: 5300-5110 OT Time Calculation (min): 28 min Charges:  OT General Charges $OT Visit: 1 Visit OT Evaluation $OT Eval Moderate Complexity: 1 Mod OT Treatments $Self Care/Home Management : 8-22 mins  07/05/2020  Rich, OTR/L  Acute Rehabilitation Services  Office:  [place] 07/05/2020, 5:16 PM

## 2020-07-05 NOTE — ED Triage Notes (Signed)
Pt bib family with increased falls for the last 4 days. Family reports pt with expressive aphasia and difficulty doing every day tasks without being told how to do them. Family reports this has been going on for several days, starting after the 2nd fall.

## 2020-07-05 NOTE — ED Provider Notes (Signed)
Taylor Willis Provider Note   CSN: [id number] Arrival date & time: 07/05/20  1001     History Chief Complaint  Patient presents with  . Stroke Symptoms    Taylor Willis is a 80 y.o. male.  80 year old male presents from home with complaint of word finding difficulty for the past 3 days. History is provided by wife, states patient went to get out of bed 4 days ago when he slid to the floor and had a minor abrasion to his nose, woke up the next morning and wife noticed difficulty walking, word finding difficulties.  On dialysis, attends dialysis Tuesday, Thursday, Saturday, did have full dialysis yesterday.  Wife states patient was admitted to the hospital pain to February when he started on dialysis, had 67-month stay in rehab after his discharge.  While in rehab, had an overdose of medication and feels like he has never been the same mentally since.\ Patient is alert to person, does not recall date of birth and unable to say where he is today. Level 5 caveat applies.         Past Medical History:  Diagnosis Date  . Arthritis    gout  . Asthma    has rescue inhaler  . Cancer [facility])    prostate  . Chronic kidney disease   . GERD (gastroesophageal reflux disease)   . H/O peptic ulcer 1970's  . Herpes 04/28/2020  . Hyperlipidemia   . Hypertension   . Ureteral stone   . Wears dentures   . Wears glasses     Patient Active Problem List   Diagnosis Date Noted  . CVA (cerebral vascular accident) ([place]) 07/05/2020  . 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  . Elevated troponin 04/29/2020  . Hypokalemia 04/29/2020  . Secondary hyperparathyroidism ([place]) 04/29/2020  . BMI 31.0-31.9,adult 04/23/2020  . 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 Acute metabolic encephalopathy 27/07/2374  . Hyponatremia 04/10/2020  . Acute respiratory failure with hypoxia ([place]) 04/04/2020  .  Pulmonary edema 04/04/2020  . GERD (gastroesophageal reflux disease) 04/04/2020  . Prostate cancer ([place]) 04/04/2020  . History of asthma 04/04/2020  . CKD (chronic kidney disease) stage 5, GFR less than 15 ml/min (HCC) 10/12/2019  . 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 Hyperkalemia 11/24/2018  . Age-related nuclear cataract of both eyes 03/04/2018  . Risk for falls 03/04/2018  . Benign essential hypertension 09/08/2017  . Disturbance in sleep behavior 09/08/2017  . History of adenomatous polyp of colon 09/08/2017  . Hyperuricemia 09/08/2017  . Mild intermittent asthma, uncomplicated 28/31/5176  . Personal history of malignant neoplasm of prostate 09/08/2017  . Urinary incontinence 09/08/2017  . Ureteral calculus 05/23/2012    Past Surgical History:  Procedure Laterality Date  . AV FISTULA PLACEMENT Left 02/15/2019   Procedure: ARTERIOVENOUS (AV) FISTULA CREATION LEFT ARM;  Surgeon: Rosetta Posner, MD;  Location: [place];  Service: Vascular;  Laterality: Left;  . CATARACT EXTRACTION W/ INTRAOCULAR LENS  IMPLANT, BILATERAL    . CYSTOSCOPY/RETROGRADE/URETEROSCOPY/STONE EXTRACTION WITH BASKET Right 05/23/2012   Procedure: CYSTOSCOPY/RETROGRADE/URETEROSCOPY/STONE EXTRACTION WITH BASKET;  Surgeon: Bernestine Amass, MD;  [facility];  Service: Urology;  Laterality: Right;  . FRACTURE SURGERY Right    as a child  . HERNIA REPAIR    . HOLMIUM LASER APPLICATION Right 1/60/7371   Procedure: HOLMIUM LASER APPLICATION;  Surgeon: Bernestine Amass, MD;  Location: [place]  [place];  Service: Urology;  Laterality: Right;  . INSERTION OF DIALYSIS CATHETER Right 04/08/2020   Procedure: INSERTION OF TUNNELED DIALYSIS CATHETER;  Surgeon: Virl Cagey, MD;  Location: [facility];  Service: General;  Laterality: Right;  . LIGATION OF ARTERIOVENOUS  FISTULA Left 09/12/2019   Procedure: SIDE BRANCH LIGATION  OF LEFT ARM ARTERIOVENOUS FISTULA;  Surgeon: Elam Dutch,  MD;  Location: [place];  Service: Vascular;  Laterality: Left;  Marland Kitchen MULTIPLE TOOTH EXTRACTIONS    . PERIPHERAL VASCULAR BALLOON ANGIOPLASTY  05/22/2020   Procedure: PERIPHERAL VASCULAR BALLOON ANGIOPLASTY;  Surgeon: Cherre Robins, MD;  Location: [facility];  Service: Cardiovascular;;  Left arm fistula  . REVISON OF ARTERIOVENOUS FISTULA Left 09/12/2019   Procedure: REVISON OF LEFT ARM ARTERIOVENOUS FISTULA;  Surgeon: Elam Dutch, MD;  Location: [place];  Service: Vascular;  Laterality: Left;  . ROBOT ASSISTED LAPAROSCOPIC RADICAL PROSTATECTOMY  2011  . TONSILLECTOMY         No family history on file.  Social History   Tobacco Use  . Smoking status: Former Smoker    Types: Cigarettes    Quit date: 05/18/1982    Years since quitting: 38.1  . Smokeless tobacco: Never Used  Vaping Use  . Vaping Use: Never used  Substance Use Topics  . Alcohol use: Not Currently    Alcohol/week: 1.0 standard drink    Types: 1 Standard drinks or equivalent per week  . Drug use: Never    Home Medications Prior to Admission medications   Medication Sig Start Date End Date Taking? Authorizing Provider  acetaminophen (TYLENOL) 500 MG tablet Take 500 mg by mouth every 6 (six) hours as needed for moderate pain.   Yes [provider]  albuterol (PROVENTIL HFA;VENTOLIN HFA) 108 (90 BASE) MCG/ACT inhaler Inhale 2 puffs into the lungs every 6 (six) hours as needed for wheezing.   Yes [provider]  allopurinol (ZYLOPRIM) 100 MG tablet Take 100 mg by mouth daily as needed (gout). 12/04/19  Yes [provider]  calcitRIOL (ROCALTROL) 0.25 MCG capsule Take 0.25 mcg by mouth 3 (three) times a week. MWF 03/21/20  Yes [provider]  calcium acetate (PHOSLO) 667 MG capsule Take 667 mg by mouth 2 (two) times daily with a meal.   Yes [provider]  carvedilol (COREG) 3.125 MG tablet Take 1 tablet (3.125 mg total) by mouth See admin instructions. Take 1 tablet in  the evenings on Dialysis days (Tues, Thurs, and Sat), On Non-dialysis days take 1 tablet twice a day. 04/29/20  Yes Roxan Hockey, MD  Cholecalciferol 25 MCG (1000 UT) tablet Take 1,000 Units by mouth daily.    Yes [provider]  Cinnamon 500 MG capsule Take 500 mg by mouth daily.   Yes [provider]  vitamin B-12 (CYANOCOBALAMIN) 1000 MCG tablet Take 1,000 mcg by mouth daily.   Yes [provider]    Allergies    Aspirin, Hydrochlorothiazide, and Ibuprofen  Review of Systems   Review of Systems  Unable to perform ROS: Mental status change    Physical Exam Updated Vital Signs BP (!) 161/71   Pulse 60   Temp 98.2 F (36.8 C)   Resp 20   SpO2 99%   Physical Exam Vitals and nursing note reviewed.  Constitutional:      General: He is not in acute distress.    Appearance: Normal appearance. He is not ill-appearing, toxic-appearing or diaphoretic.  Comments: Minor abrasion to bridge of nose  HENT:     Head: Normocephalic.     Nose: Nose normal.     Mouth/Throat:     Mouth: Mucous membranes are moist.  Eyes:     Extraocular Movements: Extraocular movements intact.     Conjunctiva/sclera: Conjunctivae normal.     Pupils: Pupils are equal, round, and reactive to light.  Cardiovascular:     Rate and Rhythm: Bradycardia present. Rhythm irregular.     Heart sounds: Murmur heard.    Pulmonary:     Effort: Pulmonary effort is normal.     Breath sounds: Normal breath sounds.  Abdominal:     Palpations: Abdomen is soft.     Tenderness: There is no abdominal tenderness.  Musculoskeletal:        General: No swelling, tenderness or deformity. Normal range of motion.     Cervical back: Neck supple.     Right lower leg: No edema.     Left lower leg: No edema.  Skin:    General: Skin is warm and dry.     Findings: No erythema or rash.     Comments: Bruising, minor wound right arm (not acute per wife)  Neurological:     Mental Status: He is  alert.     GCS: GCS eye subscore is 4. GCS verbal subscore is 4. GCS motor subscore is 6.     Cranial Nerves: No facial asymmetry.     Sensory: Sensation is intact. No sensory deficit.     Motor: No weakness, tremor or pronator drift.     Coordination: Finger-Nose-Finger Test abnormal. Heel to Shin Test normal.     Comments: Follows commands, unable to recall birth date or current location.   Psychiatric:        Behavior: Behavior normal.     ED Results / Procedures / Treatments   Labs (all labs ordered are listed, but only abnormal results are displayed) Labs Reviewed  PROTIME-INR - Abnormal; Notable for the following components:      Result Value   Prothrombin Time 15.5 (*)    All other components within normal limits  CBC - Abnormal; Notable for the following components:   RBC 3.66 (*)    Hemoglobin 10.6 (*)    HCT 36.2 (*)    MCHC 29.3 (*)    Platelets 148 (*)    All other components within normal limits  COMPREHENSIVE METABOLIC PANEL - Abnormal; Notable for the following components:   Potassium 3.1 (*)    Creatinine, Ser 4.47 (*)    Calcium 8.7 (*)    Albumin 2.9 (*)    GFR, Estimated 13 (*)    All other components within normal limits  I-STAT CHEM 8, ED - Abnormal; Notable for the following components:   Potassium 3.4 (*)    BUN 24 (*)    Creatinine, Ser 4.50 (*)    Calcium, Ion 1.13 (*)    Hemoglobin 11.6 (*)    HCT 34.0 (*)    All other components within normal limits  TROPONIN I (HIGH SENSITIVITY) - Abnormal; Notable for the following components:   Troponin I (High Sensitivity) 44 (*)    All other components within normal limits  RESP PANEL BY RT-PCR (FLU A&B, COVID) ARPGX2  APTT  DIFFERENTIAL  CBG MONITORING, ED    EKG EKG Interpretation  Date/Time:  Friday July 05 2020 10:18:53 EDT Ventricular Rate:  54 PR Interval:    QRS Duration: 94 QT  Interval:  466 QTC Calculation: 441 R Axis:   29 Text Interpretation: Atrial fibrillation with slow  ventricular response ST & T wave abnormality, consider inferior ischemia ST & T wave abnormality, consider anterolateral ischemia Abnormal ECG Confirmed by Lacretia Leigh ([id number]) on 07/05/2020 11:03:48 AM   Radiology CT HEAD WO CONTRAST  Result Date: 07/05/2020 CLINICAL DATA:  Increased falls at home for 4 days with confusion and expressive aphasia. Neuro deficit, acute. Stroke suspected. EXAM: CT HEAD WITHOUT CONTRAST TECHNIQUE: Contiguous axial images were obtained from the base of the skull through the vertex without intravenous contrast. COMPARISON:  CT head 04/29/2020. FINDINGS: Brain: There is new ill-defined density medially in the left temporal lobe, extending posteriorly into the left occipital lobe white matter. The overlying cortex appears intact. There is no evidence of acute intracranial hemorrhage, extra-axial fluid collection or midline shift. Patchy low-density in the periventricular white matter is otherwise stable, most consistent with chronic small vessel ischemic change. No hydrocephalus. Vascular: Intracranial vascular calcifications. No hyperdense vessel identified. Skull: Negative for fracture or focal lesion. Sinuses/Orbits: The visualized paranasal sinuses and mastoid air cells are clear. No orbital abnormalities are seen. Other: Previous bilateral lens surgery. IMPRESSION: 1. New ill-defined low-density medially in the left temporal and occipital lobes, most consistent with a subacute nonhemorrhagic stroke. Encephalitis considered less likely. Correlate clinically and consider MRI for further evaluation. 2. No evidence of acute intracranial hemorrhage, midline shift or hydrocephalus. 3. Otherwise stable chronic small vessel ischemic changes Electronically Signed   By: Richardean Sale M.D.   On: 07/05/2020 12:10    Procedures Procedures   Medications Ordered in ED Medications  sodium chloride flush (NS) 0.9 % injection 3 mL (has no administration in time range)  calcitRIOL  (ROCALTROL) capsule 0.25 mcg (has no administration in time range)  calcium acetate (PHOSLO) capsule 667 mg (has no administration in time range)  vitamin B-12 (CYANOCOBALAMIN) tablet 1,000 mcg (has no administration in time range)  cholecalciferol (VITAMIN D3) tablet 1,000 Units (has no administration in time range)  albuterol (PROVENTIL) (2.5 MG/3ML) 0.083% nebulizer solution 2.5 mg (has no administration in time range)   stroke: mapping our early stages of recovery book (has no administration in time range)  acetaminophen (TYLENOL) tablet 650 mg (has no administration in time range)    Or  acetaminophen (TYLENOL) 160 MG/5ML solution 650 mg (has no administration in time range)    Or  acetaminophen (TYLENOL) suppository 650 mg (has no administration in time range)  senna-docusate (Senokot-S) tablet 1 tablet (has no administration in time range)  heparin injection 5,000 Units (has no administration in time range)    ED Course  I have reviewed the triage vital signs and the nursing notes.  Pertinent labs & imaging results that were available during my care of the patient were reviewed by me and considered in my medical decision making (see chart for details).  Clinical Course as of 07/05/20 1350  Fri Jul 06, 9870  4749 80 year old male with complaint of word finding difficulty for the past 4 days, onset after sliding out of bed the night before onset of symptoms. Also difficulty ambulating without unilateral weakness.  Difficulty with finger to nose on assessment. Alert to person, not alert to place, DOB.  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  [  LM]  1238 CT shows subacute non hemorrhagic stroke. Discussed with Dr. Zenia Resides, ER physician who has seen the patient. Discussed results with patient and his wife at bedside.  Plan is to consult hospitalist for admission.  [LM]  9407 Discussed with internal medicine teaching service who requests consult to neurology, will consult for admission. [LM]  1259 Case discussed with Dr. Curly Shores, neuro hospitalist, requests MRA head and neck, MRI brain without contrast, may be a mass requiring neurosurgical evaluation versus CVA. [LM]    Clinical Course User Index [LM] Roque Lias   MDM Rules/Calculators/A&P                          Final Clinical Impression(s) / ED Diagnoses Final diagnoses:  Cerebrovascular accident (CVA) due to other mechanism [facility])    Rx / [place] Orders ED Discharge Orders    None       Roque Lias 07/05/20 1350    Lacretia Leigh, MD 07/07/20 1623

## 2020-07-05 NOTE — Progress Notes (Signed)
Echo attempted. Patient with OT and provider. Will attempt again later as time permits.

## 2020-07-05 NOTE — Progress Notes (Signed)
Notified by tele that patient's HR dropped to 39 and had a SVR pause of 2.07 seconds. Internal medicine made aware.

## 2020-07-05 NOTE — Consult Note (Signed)
Requesting Physician: Dr. Daryll Drown    Chief Complaint: L PCA + R PICA Stroke   History obtained from:  Patient and Chart   HPI:  Taylor Willis is a 80 y.o. 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 Neurology was consulted due to CT findings of new ill-defined low-density medially in the left temporal and occipital lobes, most consistent with a subacute nonhemorrhagic stroke.   Patients wife states that 2-3 days ago the patient had a fall and speech difficulty. As these symptoms continued throughout the week he was brought in to the ED today. Wife states that at the time of the fall and speech difficulty the patient did not have facial droop, slurred speech, double vision, visual deficits or difficulty with coordination.   Date last known well: Exact date not known, approximately 2-3 days ago  Time last known well: Unknown  tPA Given: No, due to being outside of the time window  Modified Rankin: Rankin Score=4  Past Medical History:  Diagnosis Date  . Arthritis    gout  . Asthma    has rescue inhaler  . Cancer [facility])    prostate  . Chronic kidney disease   . GERD (gastroesophageal reflux disease)   . H/O peptic ulcer 1970's  . Herpes 04/28/2020  . Hyperlipidemia   . Hypertension   . Ureteral stone   . Wears dentures   . Wears glasses      Past Surgical History:  Procedure Laterality Date  . AV FISTULA PLACEMENT Left 02/15/2019   Procedure: ARTERIOVENOUS (AV) FISTULA CREATION LEFT ARM;  Surgeon: Rosetta Posner, MD;  Location: [place];  Service: Vascular;  Laterality: Left;  . CATARACT EXTRACTION W/ INTRAOCULAR LENS  IMPLANT, BILATERAL    . CYSTOSCOPY/RETROGRADE/URETEROSCOPY/STONE EXTRACTION WITH BASKET Right 05/23/2012   Procedure: CYSTOSCOPY/RETROGRADE/URETEROSCOPY/STONE EXTRACTION WITH BASKET;  Surgeon: Bernestine Amass, MD;  Location: [facility];  Service: Urology;  Laterality: Right;  . FRACTURE SURGERY Right    as a child  . HERNIA REPAIR    . HOLMIUM LASER APPLICATION Right 04/25/5571   Procedure: HOLMIUM LASER APPLICATION;  Surgeon: Bernestine Amass, MD;  Location: [facility] [place];  Service: Urology;  Laterality: Right;  . INSERTION OF DIALYSIS CATHETER Right 04/08/2020   Procedure: INSERTION OF TUNNELED DIALYSIS CATHETER;  Surgeon: Virl Cagey, MD;  Location: [facility];  Service: General;  Laterality: Right;  . LIGATION OF ARTERIOVENOUS  FISTULA Left 09/12/2019   Procedure: SIDE BRANCH LIGATION  OF LEFT ARM ARTERIOVENOUS FISTULA;  Surgeon: Elam Dutch, MD;  Location: [place];  Service: Vascular;  Laterality: Left;  Marland Kitchen MULTIPLE TOOTH EXTRACTIONS    . PERIPHERAL VASCULAR BALLOON ANGIOPLASTY  05/22/2020   Procedure: PERIPHERAL VASCULAR BALLOON ANGIOPLASTY;  Surgeon: Cherre Robins, MD;  Location: [place] [facility];  Service: Cardiovascular;;  Left arm fistula  . REVISON OF ARTERIOVENOUS FISTULA Left 09/12/2019   Procedure: REVISON OF LEFT ARM ARTERIOVENOUS FISTULA;  Surgeon: Elam Dutch, MD;  Location: [place];  Service: Vascular;  Laterality: Left;  . ROBOT ASSISTED LAPAROSCOPIC RADICAL PROSTATECTOMY  2011  . TONSILLECTOMY      No family history on file.        Social History:  reports that he quit smoking about 38 years ago. His smoking use included cigarettes. He has never used smokeless tobacco. He reports  previous alcohol use of about 1.0 standard drink of alcohol per week. He reports that he does not use drugs.  Allergies:  Allergies  Allergen Reactions  . Aspirin Other (See Comments)    GI irritation   . Hydrochlorothiazide     Dizziness and unable to walk  . Ibuprofen Other (See Comments)    GI irritation     Home Medications: No current facility-administered medications on file prior to encounter.   Current Outpatient Medications on File Prior to Encounter  Medication  Sig Dispense Refill  . acetaminophen (TYLENOL) 500 MG tablet Take 500 mg by mouth every 6 (six) hours as needed for moderate pain.    Marland Kitchen albuterol (PROVENTIL HFA;VENTOLIN HFA) 108 (90 BASE) MCG/ACT inhaler Inhale 2 puffs into the lungs every 6 (six) hours as needed for wheezing.    Marland Kitchen allopurinol (ZYLOPRIM) 100 MG tablet Take 100 mg by mouth daily as needed (gout).    . calcitRIOL (ROCALTROL) 0.25 MCG capsule Take 0.25 mcg by mouth 3 (three) times a week. MWF    . calcium acetate (PHOSLO) 667 MG capsule Take 667 mg by mouth 2 (two) times daily with a meal.    . carvedilol (COREG) 3.125 MG tablet Take 1 tablet (3.125 mg total) by mouth See admin instructions. Take 1 tablet in the evenings on Dialysis days (Tues, Thurs, and Sat), On Non-dialysis days take 1 tablet twice a day. 60 tablet 5  . Cholecalciferol 25 MCG (1000 UT) tablet Take 1,000 Units by mouth daily.     . Cinnamon 500 MG capsule Take 500 mg by mouth daily.    . vitamin B-12 (CYANOCOBALAMIN) 1000 MCG tablet Take 1,000 mcg by mouth daily.      ROS:  History obtained from the patient  General ROS: negative for - chills, fatigue, fever, night sweats, weight gain or weight loss Psychological ROS: negative for - , hallucinations, memory difficulties, mood swings or  Ophthalmic ROS: negative for - blurry vision, double vision, eye pain or loss of vision ENT ROS: negative for - epistaxis, nasal discharge, oral lesions, sore throat, tinnitus or vertigo Respiratory ROS: negative for - cough,  shortness of breath or wheezing Cardiovascular ROS: negative for - chest pain, dyspnea on exertion,  Gastrointestinal ROS: negative for - abdominal pain, diarrhea,  nausea/vomiting or stool incontinence Genito-Urinary ROS: negative for - dysuria, hematuria, incontinence or urinary frequency/urgency Musculoskeletal ROS: negative for - joint swelling or muscular weakness Neurological ROS: as noted in HPI   General Examination:  Constitutional: Elderly  Caucasian male sitting in the chair, NAD  Cardiovascular: RRR Respiratory: Unlabored respirations   Neurological Examination Mental Status: Oriented to self and place only. Not oriented to time. Knows name but can not state how old he is. Follows commands (make fist/open, show two fingers)  Speech: Fluent with repetition and naming intact. No dysarthria  Cranial Nerves: EOMI, right homonymous hemianopsia, Face symmetric, Tongue midline  Motor: Normal bulk and tone. RUE seems to be 4+ to 5/5; did not extensively assess given arm pain in the setting of fall.  LUE 5/5.  RLE 5/5 aside from dorsi flexion which is 2/5.  LLE 5/5  Sensory: Sensation intact to light touch throughout  Deep Tendon Reflexes: 3+ and symmetric throughout. Withdraws with plantars Coordination: Tremor noted to right hand. FNF + HTS intact.  Gait: Deferred   NIHSS 2 for R [facility]    Pertinent Imaging and Labwork 07/05/20 CT Head WO Contrast 1. New ill-defined low-density medially in the left  temporal and occipital lobes, most consistent with a subacute nonhemorrhagic stroke. Encephalitis considered less likely. Correlate clinically and consider MRI for further evaluation. 2. No evidence of acute intracranial hemorrhage, midline shift or hydrocephalus. 3. Otherwise stable chronic small vessel ischemic changes  07/05/20 MRA Angio Head and Neck WO Contrast  MRA head: 1. The left posterior cerebral artery is poorly delineated beyond the P3/P4 junction, likely occluded. 2. Flow related signal is not definitively identified within the right PICA. 3. Elsewhere, no intracranial large vessel occlusion or proximal high-grade arterial stenosis is identified.  MRA neck: 1. The aortic arch, common carotid artery origins, right innominate artery and very proximal subclavian arteries are excluded from the field of view. 2. The visible common carotid and internal carotid arteries are patent within the neck without hemodynamically  significant stenosis. Mild atherosclerotic plaque about the carotid bifurcations and within the proximal internal carotid arteries, bilaterally. 3. Vertebral arteries patent within the neck with antegrade flow and without stenosis.  07/05/20 MRI Brain WO Contrast  Large acute cortical/subcortical infarct within the medial left temporal and occipital lobes as well as dorsal lateral left thalamus (left PCA vascular territory). Small foci of petechial hemorrhage are present within the infarction territory. Additional patchy small acute infarcts are present within the inferior right cerebellar hemisphere (PICA territory). A subtle focus of apparent diffusion weighted hyperintensity within the medial left cerebellar hemisphere is favored artifactual.  Labs:  Hemoglobin A1C pending Lipid panel pending   Assessment: 80 y.o. male w/pmh of ESRD on TuThSa dialysis, HTN, HLD prostate cancer s/p prostectomy 10 years prior, 1st degree AV block, and asthma who presents with speech difficulty and falls found to have a L PCA + R PICA stroke for which neurology was consulted.  1. Neurological examination is pertinent for R [facility] which correlates with L PCA stroke. His MRA Head and Neck was pertinent for a left PCA occlusion + patent vertebral arteries without stenosis. Stroke etiology is cardioembolic in the setting of atrial fibrillation- noted on EKG today. He will need anticoagulation. Given that he is 4 days out from stroke with minimal petechial hemorrhage in a portion of the infarction, it is safe and efficacious to start anticoagulation now based on overall risk/benefit profile.   2. MRI head: Large acute cortical/subcortical infarct within the medial left temporal and occipital lobes, as well as dorsolateral left thalamus (left PCA vascular territory). Small foci of associated petechial Hemorrhage. Additional small patchy acute infarcts within the inferior right cerebellar hemisphere (PICA territory).  Mild-to-moderate cerebral white matter chronic small vessel ischemic disease. Mild generalized parenchymal atrophy. 3. MRA head: The left posterior cerebral artery is poorly delineated beyond the P3/P4 junction, likely occluded. Flow related signal is not definitively identified within the right PICA. Elsewhere, no intracranial large vessel occlusion or proximal high-grade arterial stenosis is identified. 4. MRA neck: The aortic arch, common carotid artery origins, right innominate artery and very proximal subclavian arteries are excluded from the field of view. The visible common carotid and internal carotid arteries are patent within the neck without hemodynamically significant stenosis. Mild atherosclerotic plaque about the carotid bifurcations and within the proximal internal carotid arteries, bilaterally. Vertebral arteries patent within the neck with antegrade flow and without stenosis. 5. TTE: Left ventricular ejection fraction, by estimation, is 50 to 55%. The left ventricle has low normal function with no regional wall motion abnormalities. Left atrial size is moderately dilated. No mural thrombus or valvular vegetation described in the report.  6. Stroke Risk Factors -  Cancer history, CKD, HLD and HTN  Recommendations:  - Follow up hemoglobin A1C + Lipid panel + Echocardiogram  - Outside of permissive HTN window, can target normotensive blood pressures being careful to avoid acute drops when starting blood pressure agents  - Continue Atorvastatin 40 mg QD for secondary stroke prevention. -  Stroke etiology is cardioembolic in the setting of atrial fibrillation as noted on EKG today. He will need anticoagulation. Given that he is 4 days out from stroke with minimal petechial hemorrhage in a portion of the infarction, it is safe and efficacious to start anticoagulation now based on overall risk/benefit profile.  - Stop Plavix when initiating Eliquis  - PT/OT/Speech - Cardiac Telemetry - Stroke  Team to follow in AM.   Courtney Heard-NP  [facility] 07/05/2020, 4:29 PM   I have seen and examined the patient. I have reviewed the assessment and recommendations and made amendations as indicated. 80 year old male with a history of CKD, HLD and HTN presenting with subacute left PCA territory stroke. Exam reveals right homonymous hemianopsia. Stroke etiology is cardioembolic in the setting of atrial fibrillation as noted on EKG today. He will need anticoagulation. Given that he is 4 days out from stroke with minimal petechial hemorrhage in a portion of the infarction, it is safe and efficacious to start anticoagulation now based on overall risk/benefit profile. Stop Plavix when initiating Eliquis. Electronically signed: Dr. Kerney Elbe

## 2020-07-05 NOTE — ED Notes (Signed)
MD at bedside. 

## 2020-07-06 DIAGNOSIS — I6389 Other cerebral infarction: Secondary | ICD-10-CM | POA: Diagnosis not present

## 2020-07-06 DIAGNOSIS — I480 Paroxysmal atrial fibrillation: Secondary | ICD-10-CM

## 2020-07-06 LAB — CBC
HCT: 33.8 % — ABNORMAL LOW (ref 39.0–52.0)
Hemoglobin: 10.2 g/dL — ABNORMAL LOW (ref 13.0–17.0)
MCH: 29.1 pg (ref 26.0–34.0)
MCHC: 30.2 g/dL (ref 30.0–36.0)
MCV: 96.6 fL (ref 80.0–100.0)
Platelets: 135 10*3/uL — ABNORMAL LOW (ref 150–400)
RBC: 3.5 MIL/uL — ABNORMAL LOW (ref 4.22–5.81)
RDW: 15 % (ref 11.5–15.5)
WBC: 6.6 10*3/uL (ref 4.0–10.5)
nRBC: 0 % (ref 0.0–0.2)

## 2020-07-06 LAB — RENAL FUNCTION PANEL
Albumin: 2.5 g/dL — ABNORMAL LOW (ref 3.5–5.0)
Anion gap: 8 (ref 5–15)
BUN: 25 mg/dL — ABNORMAL HIGH (ref 8–23)
CO2: 30 mmol/L (ref 22–32)
Calcium: 8.5 mg/dL — ABNORMAL LOW (ref 8.9–10.3)
Chloride: 102 mmol/L (ref 98–111)
Creatinine, Ser: 5.01 mg/dL — ABNORMAL HIGH (ref 0.61–1.24)
GFR, Estimated: 11 mL/min — ABNORMAL LOW (ref >60)
Glucose, Bld: 142 mg/dL — ABNORMAL HIGH (ref 70–99)
Phosphorus: 3.4 mg/dL (ref 2.5–4.6)
Potassium: 3.2 mmol/L — ABNORMAL LOW (ref 3.5–5.1)
Sodium: 140 mmol/L (ref 135–145)

## 2020-07-06 LAB — MAGNESIUM: Magnesium: 1.8 mg/dL (ref 1.7–2.4)

## 2020-07-06 LAB — LIPID PANEL
Cholesterol: 118 mg/dL (ref 0–200)
HDL: 36 mg/dL — ABNORMAL LOW (ref >40)
LDL Cholesterol: 59 mg/dL (ref 0–99)
Total CHOL/HDL Ratio: 3.3 RATIO
Triglycerides: 114 mg/dL (ref <150)
VLDL: 23 mg/dL (ref 0–40)

## 2020-07-06 MED ORDER — HEPARIN SODIUM (PORCINE) 1000 UNIT/ML IJ SOLN
INTRAMUSCULAR | Status: AC
  Administered 2020-07-06: 1000 [IU]
  Filled 2020-07-06: qty 4

## 2020-07-06 MED ORDER — CHLORHEXIDINE GLUCONATE CLOTH 2 % EX PADS
6.0000 | MEDICATED_PAD | Freq: Every day | CUTANEOUS | Status: DC
  Administered 2020-07-06: 6 via TOPICAL

## 2020-07-06 NOTE — Progress Notes (Signed)
[facility] Progress Note   Subjective:  Seen in HD unit. UF goal 2L. Tolerating so far. No new complaints  Objective Vitals:   07/05/20 2206 07/05/20 2358 07/06/20 0423 07/06/20 0700  BP: (!) 143/64 (!) 152/82 (!) 167/72 (!) 160/70  Pulse: 62 60 (!) 58 60  Resp: 20 17 19 18   Temp: 98.6 F (37 C) 98.6 F (37 C) 97.9 F (36.6 C) 98 F (36.7 C)  TempSrc: Oral Oral Oral Oral  SpO2: 97% 96% 97% 97%  Weight:    81 kg     Additional Objective Labs: Basic Metabolic Panel: Recent Labs  Lab 07/05/20 1028 07/05/20 1046 07/06/20 0112  NA 139 140 140  K 3.1* 3.4* 3.2*  CL 102 99 102  CO2 29  --  30  GLUCOSE 98 98 142*  BUN 19 24* 25*  CREATININE 4.47* 4.50* 5.01*  CALCIUM 8.7*  --  8.5*  PHOS  --   --  3.4   CBC: Recent Labs  Lab 07/05/20 1028 07/05/20 1046 07/06/20 0112  WBC 6.6  --  6.6  NEUTROABS 4.5  --   --   HGB 10.6* 11.6* 10.2*  HCT 36.2* 34.0* 33.8*  MCV 98.9  --  96.6  PLT 148*  --  135*   Blood Culture    Component Value Date/Time   SDES  04/29/2020 0236    URINE, CLEAN CATCH Performed at [facility], [address] [place], [place] [address]    [facility]  04/29/2020 0236    NONE Performed at [facility], [address], [place], [place] [address]    CULT (A) 04/29/2020 0236    <10,000 COLONIES/mL INSIGNIFICANT GROWTH Performed at [facility] [address] [place], [place] [address]    REPTSTATUS 04/30/2020 FINAL 04/29/2020 0236     Physical Exam General: Elderly man, nad  Heart: RRR No m,r,g  Lungs: Clear bilaterally  Abdomen: soft; abd binder in place  Extremities: No sig LE edema  Dialysis Access: R IJ TDC in use; LUE AVF +bruit   Medications: . sodium chloride    . sodium chloride     . apixaban  5 mg Oral BID  . atorvastatin  40 mg Oral Daily  . [START ON 07/08/2020] calcitRIOL  0.25 mcg Oral Once per day on Mon Wed Fri  . calcium acetate  667 mg Oral BID WC  . Chlorhexidine Gluconate Cloth  6 each  Topical Daily  . cholecalciferol  1,000 Units Oral Daily  . vitamin B-12  1,000 mcg Oral Daily    Dialysis Orders: [facility] TTS. Unable to reach by phone.    Assessment/Plan: 1. 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 New onset AFib  - Bradycardic on admission. Holding BB. Eliquis started --per primary  3. ESRD -  HD TTS. Continue on schedule.  HD today. . Added K+ bath  4. Dialysis access -- Has TDC and LUE AVF which is mature per VVS notes. He says using both at his center. Also has recently placed PD catheter on 5/26.  5. Hypertension/volume  - BP up. Permissive HTN in the setting of CVA.  6. Anemia  - Hgb 11.6 >10.2 . No ESA currently. Follow trends.  7. Metabolic bone disease -  Ca/Phos ok.  Continue home binders, calcitriol TIW.  8. Nutrition -Renal diet/Fluid restrictions.    Lynnda Child PA-C [facility] 07/06/2020,9:42 AM

## 2020-07-06 NOTE — Progress Notes (Signed)
    HD#1 Subjective:  Overnight Events: No acute overnight events   Patient reports HD went well this morning. He has some skin tears on his right arm from adhesive tape. The RN is helping with dressing these. We discussed workup and findings. All questions and concerns addressed.   Objective:  Vital signs in last 24 hours: Vitals:   07/06/20 0930 07/06/20 1000 07/06/20 1047 07/06/20 1130  BP: (!) 170/54 (!) 160/60 (!) 150/78 (!) 142/79  Pulse:    (!) 58  Resp:   16 16  Temp:   98.4 F (36.9 C) 98.9 F (37.2 C)  TempSrc:   Oral Oral  SpO2:   97% 100%  Weight:   78.9 kg    Supplemental O2: Room Air SpO2: 100 %   Physical Exam:  Constitutional: Elderly man , normal weight Cardiovascular: irregular pulse, no murmurs Pulmonary/Chest: normal work of breathing on room air, lungs clear to auscultation bilaterally Skin: Skin tears on right arm, skin warm Neurological: Mental Status: Patient is awake, alert, oriented x3 No signs of aphasia or neglect Cranial Nerves: II: Pupils equal, round, and reactive to light.   III,IV, VI: EOMI . Right homonymous hemianopsia V: Facial sensation is symmetric to light touch and  Temperature. VII: Facial movement is symmetric.  VIII: hearing is intact to voice X: Uvula elevates symmetrically XI: Shoulder shrug is symmetric. XII: tongue is midline without atrophy or fasciculations.  Motor: normal effort thorughout, at Least 4/5 RUE and 5/5 LUE, 5/5 bilateral lower extremitiy  Sensory: Sensation is grossly intact bilateral UEs & LEs Cerebellar: Finger-Nose intact bilateral     Assessment/Plan:   Active Problems:   CVA (cerebral vascular accident) [facility])   Patient Summary: Taylor Willis is a 80 y.o. with a pertinent PMH of ESRD on Tuesday Thursday Saturday dialysis, hypertension, hyperlipidemia, prostate cancer status post prostatectomy 10 years ago, first-degree AV block, and asthma, who presented with lower extremity weakness and  expressive aphasia who was found to have subacute left PCA + R PICA stroke.  #Acute embolic stroke secondary to A. fib -Hemoglobin A1c pending -On Eliquis for atrial fibrillation -Plan for repeat CT head tomorrow to have stability in the setting of starting Eliquis -PT/OT/SLP eval  #A. fib with slow ventricular response - Continue telemetry - Continue Eliquis  #ESRD on HD, TTHS - Tolerated HD well today  Diet: Renal IVF: None,None VTE: NOAC Code: Full PT/OT recs: Outpatient neuro PT, none. TOC recs: n/a Family Update: Wife updated on rounds, she is bedside   Dispo: Anticipated discharge to Home in 1 days pending follow up CT for stability of stroke after starting Eliquis.   Tamsen Snider, MD PGY2 Internal Medicine [phone] Please contact the on call pager after 5 pm and on weekends at [phone].

## 2020-07-06 NOTE — Progress Notes (Signed)
PT Cancellation Note  Patient Details Name: Taylor Willis MRN: [id number] DOB: 1940/11/21   Cancelled Treatment:    Reason Eval/Treat Not Completed: (P) Patient at procedure or test/unavailable Pt is off floor for HD. PT will follow back as able for Evaluation this afternoon.  Kathrene Sinopoli B. Migdalia Dk PT, DPT Acute Rehabilitation Services Pager [phone] Office [phone]    [place] 07/06/2020, 7:45 AM

## 2020-07-06 NOTE — Progress Notes (Addendum)
Stroke Progress Note Interval History:  Pt seen in HD today. He is pleasantly confused to date/situation. Neuro exam stable. MRI confirmed acute infarcts in left temporal, occipital lobes, as well as patchy right cerebellar infarct.   ROS:  History obtained from the patient  General ROS: negative for - chills, fatigue, fever, night sweats, weight gain or weight loss Psychological ROS: negative for - , hallucinations, memory difficulties, mood swings or  Ophthalmic ROS: negative for - blurry vision, double vision, eye pain or loss of vision ENT ROS: negative for - epistaxis, nasal discharge, oral lesions, sore throat, tinnitus or vertigo Respiratory ROS: negative for - cough,  shortness of breath or wheezing Cardiovascular ROS: negative for - chest pain, dyspnea on exertion,  Gastrointestinal ROS: negative for - abdominal pain, diarrhea,  nausea/vomiting or stool incontinence Genito-Urinary ROS: negative for - dysuria, hematuria, incontinence or urinary frequency/urgency Musculoskeletal ROS: negative for - joint swelling or muscular weakness Neurological ROS: as noted in HPI   General Examination:  Constitutional: Elderly Caucasian man getting HD at this time and in no acute distress Cardiovascular: RRR Respiratory: Unlabored respirations   Neurological Examination Mental Status: Oriented to self and place only. Not oriented to time. Knows name & dob, but can not state how old he is. Follows commands. Speech: Fluent with repetition and naming intact. No dysarthria  Cranial Nerves: EOMI, right homonymous hemianopsia, Face symmetric, Tongue midline  Motor: Normal bulk and tone. RUE seems to be 4+ to 5/5; did not extensively assess given arm pain and bandages in the setting of fall.  LUE 5/5.  RLE 5/5 aside from dorsi flexion which is 2/5.  LLE 5/5  Sensory: Sensation intact to light touch throughout  Deep Tendon Reflexes: 3+ and symmetric throughout. Withdraws with  plantars Coordination: Tremor noted to right hand. FNF + HTS intact.  Gait: Deferred    Pertinent Imaging and Labwork 07/05/20 CT Head WO Contrast 1. New ill-defined low-density medially in the left temporal and occipital lobes, most consistent with a subacute nonhemorrhagic stroke. Encephalitis considered less likely. Correlate clinically and consider MRI for further evaluation. 2. No evidence of acute intracranial hemorrhage, midline shift or hydrocephalus. 3. Otherwise stable chronic small vessel ischemic changes  07/05/20 MRA Angio Head and Neck WO Contrast  MRA head: 1. The left posterior cerebral artery is poorly delineated beyond the P3/P4 junction, likely occluded. 2. Flow related signal is not definitively identified within the right PICA. 3. Elsewhere, no intracranial large vessel occlusion or proximal high-grade arterial stenosis is identified.  MRA neck: 1. The aortic arch, common carotid artery origins, right innominate artery and very proximal subclavian arteries are excluded from the field of view. 2. The visible common carotid and internal carotid arteries are patent within the neck without hemodynamically significant stenosis. Mild atherosclerotic plaque about the carotid bifurcations and within the proximal internal carotid arteries, bilaterally. 3. Vertebral arteries patent within the neck with antegrade flow and without stenosis.  07/05/20 MRI Brain WO Contrast  Large acute cortical/subcortical infarct within the medial left temporal and occipital lobes as well as dorsal lateral left thalamus (left PCA vascular territory). Small foci of petechial hemorrhage are present within the infarction territory. Additional patchy small acute infarcts are present within the inferior right cerebellar hemisphere (PICA territory). A subtle focus of apparent diffusion weighted hyperintensity within the medial left cerebellar hemisphere is favored artifactual.  Labs:  Hemoglobin A1C  pending Lipid panel pending   Assessment: 80 y.o. 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 Stroke Risk Factors - Cancer history, CKD, HLD and HTN  1. 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 Stroke etiology is cardioembolic  -MRI head: Large acute cortical/subcortical infarct within the medial left temporal and occipital lobes, as well as dorsolateral left thalamus (left PCA vascular territory). Small foci of associated petechial hemorrhage. Additional small patchy acute infarcts within the inferior right cerebellar hemisphere (PICA territory). Mild-to-moderate cerebral white matter chronic small vessel ischemic disease. Mild generalized parenchymal atrophy.  -TTE: Left ventricular ejection fraction, by estimation, is 50 to 55%. The left ventricle has low normal function with no regional wall motion abnormalities. Left atrial size is moderately dilated. No mural thrombus or valvular vegetation described in the report.   -LDL is 59, far under goal <70. Not on statin at home. No indication to start statin at this time.  -A1c pending. No h/o DM2 and previous A1c in March was 5.5  2. Afib- He was started on Eliquis 6/3, since he was 4 days out from stroke at that time, with minimal petechial hemorrhage in a portion of the infarction, it is safe and efficacious to start anticoagulation now based on overall risk/benefit profile.   Recommendations:  -DC high intensity statin -Continue Eliquis for secondary stroke prevention -Will check CTH in am for stability given start of Eliquis with  large stroke vol and risk for hemorrhagic conversion  - PT/OT/Speech   Desiree Metzger-Cihelka, ARNP-C, ANVP-BC Pager: [phone] 07/06/2020, 11:52 AM     Neurology Attending Attestation  I personally reviewed the chart and available images and discussed the diagnosis & management plan with the neurology APP.   I agree with the history, physical, assessment, and plan of care.   L PCA stroke in the setting of new diagnosis of afib. MRA with distal L PCA occlusion. Started on Eliquis yesterday. Will repeat CT head tomorrow to assess stability    Please feel free to contact me with any questions or concerns.  Sincerely, Josiah Lobo, MD Neurohospitalist

## 2020-07-06 NOTE — Progress Notes (Signed)
Pt transported to dialysis

## 2020-07-06 NOTE — Progress Notes (Signed)
SLP Cancellation Note  Patient Details Name: Taylor Willis MRN: [id number] DOB: 28-Aug-1940   Cancelled treatment:       Reason Eval/Treat Not Completed: Patient at procedure or test/unavailable. Pt is currently in HD.  SLP will f/u as schedule allows.    [name] 07/06/2020, 8:25 AM

## 2020-07-06 NOTE — Evaluation (Signed)
Physical Therapy Evaluation Patient Details Name: Taylor Willis MRN: [id number] DOB: 1940/08/07 Today's Date: 07/06/2020   History of Present Illness  80 y.o. male with ESRD on HD. HD started during [facility] admission 04/04/20 PMH also significant for HTN, HLD, gout, h/o prostate cancer. He presented after a fall at home earlier this week. Fall was due to LE weakness. Family noted progressive weakness and aphasia after the fall.  MRI brain showing large acute cortical/subcortical infarct within the medial left temporal and occipital lobes.  Clinical Impression  PTA pt living with wife in multistory home with 2 steps to enter and bed and bath upstairs. Pt with increasing use of RW for mobilization at home, independent with bADLs, wife provides for iADLs. Pt is currently limited in safe mobility by decreased L sided attention, decreased knowledge of DME, decreased safety awareness in presence in generalized weakness and decreased endurance. Pt is currently min guard for bed mobility and transfers and min A for short distance ambulation with RW. PT recommending outpatient Neuro PT at discharge. PT will continue to follow acutely.     Follow Up Recommendations Outpatient PT;Supervision/Assistance - 24 hour (OP Neuro PT)    Equipment Recommendations  None recommended by PT (pt has necessary equipment)    Recommendations for Other Services       Precautions / Restrictions Precautions Precautions: Fall Restrictions Other Position/Activity Restrictions: Skin tear from fall to R forearm.      Mobility  Bed Mobility Overal bed mobility: Needs Assistance Bed Mobility: Supine to Sit     Supine to sit: Min guard     General bed mobility comments: with increased time and effort pt able to come to seated    Transfers Overall transfer level: Needs assistance Equipment used: Rolling walker (2 wheeled) Transfers: Sit to/from [facility] Sit to Stand: Min guard         General  transfer comment: good power up and self steadying in RW  Ambulation/Gait Ambulation/Gait assistance: Min assist Gait Distance (Feet): 30 Feet Assistive device: Rolling walker (2 wheeled) Gait Pattern/deviations: Step-through pattern;Decreased dorsiflexion - right;Decreased dorsiflexion - left;Shuffle;Trunk flexed Gait velocity: slowed Gait velocity interpretation: <1.31 ft/sec, indicative of household ambulator General Gait Details: min A for RW management and steadying with last 10 feet of gait, increased multimodal cuing with turning   Modified Rankin (Stroke Patients Only) Modified Rankin (Stroke Patients Only) Pre-Morbid Rankin Score: No symptoms Modified Rankin: Moderately severe disability     Balance Overall balance assessment: Needs assistance Sitting-balance support: Feet supported Sitting balance-Leahy Scale: Good     Standing balance support: Bilateral upper extremity supported Standing balance-Leahy Scale: Poor Standing balance comment: needs RW support                             Pertinent Vitals/Pain Pain Assessment: No/denies pain    Home Living Family/patient expects to be discharged to:: Private residence Living Arrangements: Spouse/significant other Available Help at Discharge: Family;Available 24 hours/day Type of Home: House Home Access: Stairs to enter Entrance Stairs-Rails: None Entrance Stairs-Number of Steps: 2 Home Layout: Multi-level;Bed/bath upstairs (Tri level home with finished basement down, and bedrooms up.  Kitchen, den on main level.  Enter from [employer] on [employer] level.) Home Equipment: Clinical cytogeneticist - 2 wheels      Prior Function Level of Independence: Needs assistance   Gait / Transfers Assistance Needed: Spouse walks beside him at all times with a RW  ADL's /  Homemaking Assistance Needed: patient able to bathe and dress himslef with dist supervision.  Sink baths.  Comments: Spouse assists with community mobility,  meds, meals and IADL     Hand Dominance   Dominant Hand: Right    Extremity/Trunk Assessment   Upper Extremity Assessment Upper Extremity Assessment: Defer to OT evaluation    Lower Extremity Assessment Lower Extremity Assessment: RLE deficits/detail;LLE deficits/detail RLE Deficits / Details: ROM WFL, hip and knee strength grossly 4/5,ankle dorsiflexion 2/5 RLE Sensation: WNL RLE Coordination: decreased fine motor LLE Deficits / Details: ROM WFL, hip and knee strength grossly 4/5,ankle dorsiflexion 2/5 LLE Sensation: WNL LLE Coordination: decreased fine motor    Cervical / Trunk Assessment Cervical / Trunk Assessment: Kyphotic  Communication   Communication: Expressive difficulties  Cognition Arousal/Alertness: Awake/alert Behavior During Therapy: Flat affect Overall Cognitive Status: Impaired/Different from baseline Area of Impairment: Following commands;Memory;Safety/judgement;Orientation;Problem solving                 Orientation Level: Disoriented to;Place;Situation   Memory: Decreased short-term memory Following Commands: Follows one step commands consistently;Follows multi-step commands with increased time;Follows multi-step commands inconsistently;Follows one step commands with increased time Safety/Judgement: Decreased awareness of deficits   Problem Solving: Slow processing;Requires verbal cues;Requires tactile cues;Difficulty sequencing General Comments: difficulty determining he is in the hospital for CVA, difficulty with following commands for RW usage with very little carry over, when told at door that we were going to go sit in the recliner, forgot what he was doing by the time he got to the chair      General Comments General comments (skin integrity, edema, etc.): wife present throughout session, pt with some L sided neglect, set up wife in chair on L side to increase L attention    Exercises     Assessment/Plan    PT Assessment Patient needs  continued PT services  PT Problem List Decreased balance;Decreased mobility;Decreased coordination;Decreased cognition;Decreased safety awareness;Decreased knowledge of use of DME;Decreased activity tolerance       PT Treatment Interventions DME instruction;Gait training;Functional mobility training;Therapeutic activities;Therapeutic exercise;Balance training;Cognitive remediation;Patient/family education    PT Goals (Current goals can be found in the Care Plan section)  Acute Rehab PT Goals Patient Stated Goal: To return home and restart HH OT and PT PT Goal Formulation: With patient/family Time For Goal Achievement: 07/20/20 Potential to Achieve Goals: Fair    Frequency Min 4X/week   Barriers to discharge        Co-evaluation               AM-PAC PT "6 Clicks" Mobility  Outcome Measure Help needed turning from your back to your side while in a flat bed without using bedrails?: None Help needed moving from lying on your back to sitting on the side of a flat bed without using bedrails?: A Little Help needed moving to and from a bed to a chair (including a wheelchair)?: A Little Help needed standing up from a chair using your arms (e.g., wheelchair or bedside chair)?: A Little Help needed to walk in hospital room?: A Little Help needed climbing 3-5 steps with a railing? : A Lot 6 Click Score: 18    End of Session Equipment Utilized During Treatment: Gait belt Activity Tolerance: Patient tolerated treatment well Patient left: in chair;with call bell/phone within reach;with chair alarm set Nurse Communication: Mobility status PT Visit Diagnosis: Unsteadiness on feet (R26.81);Other abnormalities of gait and mobility (R26.89);Muscle weakness (generalized) (M62.81);Difficulty in walking, not elsewhere classified (R26.2);Other symptoms and signs  involving the nervous system (R29.898);Hemiplegia and hemiparesis Hemiplegia - Right/Left: Left Hemiplegia - caused by: Cerebral  infarction    Time: 2440-1027 PT Time Calculation (min) (ACUTE ONLY): 24 min   Charges:   PT Evaluation $PT Eval Moderate Complexity: 1 Mod PT Treatments $Therapeutic Activity: 8-22 mins        Wyona Neils B. Migdalia Dk PT, DPT Acute Rehabilitation Services Pager [phone] Office [phone]   [place] 07/06/2020, 12:54 PM

## 2020-07-06 NOTE — Care Management (Signed)
Benefit check submitted for eliquis, will result Monday. Patient will be eligible for 30 day free card

## 2020-07-06 NOTE — Progress Notes (Signed)
  Date: 07/06/2020  Patient name: Taylor Willis  Medical record number: [id number]  Date of birth: 02/01/1941   I have seen and evaluated Taylor Willis and discussed their care with the Residency Team. Briefly, Mr. Chachere is a 80 year old man with PMH of ESRD, HTN, HLD, prostate cancer s/p prostatectomy, 1st degree AV block and asthma who presented with LE weakness.  This began Monday prior to admissions when he fell and hit his face due to weakness.  He also had issues with word finding that worsened and he was brought to the ED on Friday.  He is attending his dialysis as scheduled.  He is receiving HD through a tunneled catheter.    PMHx, Fam Hx, and/or Soc Hx : He does not know his family history.  Denies tobacco use, ETOH use.   Vitals:   07/06/20 1047 07/06/20 1130  BP: (!) 150/78 (!) 142/79  Pulse:  (!) 58  Resp: 16 16  Temp: 98.4 F (36.9 C) 98.9 F (37.2 C)  SpO2: 97% 100%   Gen: Lying in bed after completing dialysis, awake, alert Eyes: Anicteric sclerae, EOMI, right sided homonymous hemianopsia  CV: Mild bradycardia, normal S1 and S2, no murmur, irregular Pulm: Breathing comfortably, no wheezing Abd: Peritoneal access in place, no pain around site Neuro: He has some slowed expressions of speech.  Naming intact.  He has weakness of the right and left LE which are unchanged from admission.    MRI/MRA brain with large acute cortical/subcortical infarct within the medial left temporal and occipital lobes and dorsolateral left thalamus (PCA territory).  Patchy acute infarcts in the inferior right CBL hemisphere.  Likely occluded left posterior cerebral artery.    Assessment and Plan: I have seen and evaluated the patient as outlined above. I agree with the formulated Assessment and Plan as detailed in the residents' note, with the following changes:   1. Acute embolic stroke related to Afib  - TTE without mural thrombus or vegetation noted - A1C pending - Eliquis started given Afib  noted on Telemetry, EKG - Repeat CT head tomorrow to assess stability in the setting of starting eliquis - PT/OT/SLP evaluation  2. ESRD on HD - Nephrology has seen today - Continue HD as planned - Transition to peritoneal dialysis when appropriate in the OP setting.   3. Afib - HR was low on admission, Carvedilol held - Monitor for further elevated heart rates - Eliquis as noted above.   Other issues per Resident team's daily note.   Sid Falcon, MD 6/4/202212:16 PM

## 2020-07-07 ENCOUNTER — Inpatient Hospital Stay (HOSPITAL_COMMUNITY): Payer: Medicare Other

## 2020-07-07 DIAGNOSIS — I6389 Other cerebral infarction: Secondary | ICD-10-CM | POA: Diagnosis not present

## 2020-07-07 DIAGNOSIS — I4891 Unspecified atrial fibrillation: Secondary | ICD-10-CM

## 2020-07-07 LAB — RENAL FUNCTION PANEL
Albumin: 2.7 g/dL — ABNORMAL LOW (ref 3.5–5.0)
Anion gap: 9 (ref 5–15)
BUN: 14 mg/dL (ref 8–23)
CO2: 28 mmol/L (ref 22–32)
Calcium: 8.5 mg/dL — ABNORMAL LOW (ref 8.9–10.3)
Chloride: 99 mmol/L (ref 98–111)
Creatinine, Ser: 3.76 mg/dL — ABNORMAL HIGH (ref 0.61–1.24)
GFR, Estimated: 16 mL/min — ABNORMAL LOW (ref >60)
Glucose, Bld: 86 mg/dL (ref 70–99)
Phosphorus: 2.6 mg/dL (ref 2.5–4.6)
Potassium: 3.3 mmol/L — ABNORMAL LOW (ref 3.5–5.1)
Sodium: 136 mmol/L (ref 135–145)

## 2020-07-07 LAB — MAGNESIUM: Magnesium: 1.8 mg/dL (ref 1.7–2.4)

## 2020-07-07 MED ORDER — APIXABAN 5 MG PO TABS: 5.0000 mg | ORAL_TABLET | Freq: Two times a day (BID) | ORAL | 0 refills | Status: DC

## 2020-07-07 MED ORDER — PROSOURCE PLUS PO LIQD: 30.0000 mL | Freq: Two times a day (BID) | ORAL | Status: DC

## 2020-07-07 MED ORDER — PROSOURCE PLUS PO LIQD: 30.0000 mL | Freq: Two times a day (BID) | ORAL | 0 refills | Status: AC

## 2020-07-07 NOTE — Discharge Instructions (Signed)
Dear Taylor Willis,   Thank you for letting us participate in your care! In this section, you will find a brief hospital admission summary of why you were admitted to the hospital, what happened during your admission, your diagnosis/diagnoses, and recommended follow up.   You were admitted because you were experiencing weakness in lower extrimities .   Your testing revealed stroke and Atrial fibrillation.   You were diagnosed with stroke.  You were started on Eliquis.   You were also seen by Neurology. They recommended Eliquis.   You were seen by PT and OT and recommended to continue Home health OT and PT.   Your symptoms improved and you were discharged from the hospital for meeting this goal.    POST-HOSPITAL & CARE INSTRUCTIONS 1.  Please take Eliquis twice daily as advised. It can increase risk for bleeding. Please go to the ER if you notice any bleeding.  2. Follow up with [facility] Neurology in 4 weeks.  3. Please let PCP/Specialists know of any changes in medications that were made.  4. Please see medications section of this packet for any medication changes.  5. Continue HH PT and OT.   DOCTOR'S APPOINTMENTS & FOLLOW UP No future appointments.   Thank you for choosing [facility]! Take care and be well!  [facility]; [facility]  [place], [place] [address] [phone]   Information on my medicine - ELIQUIS (apixaban)  This medication education was reviewed with me or my healthcare representative as part of my discharge preparation.   Why was Eliquis prescribed for you? Eliquis was prescribed for you to reduce the risk of forming blood clots that can cause a stroke if you have a medical condition called atrial fibrillation (a type of irregular heartbeat) OR to reduce the risk of a blood clots forming after orthopedic surgery.  What do You need to know about Eliquis  ? Take your Eliquis TWICE DAILY - one tablet in the morning and one tablet in the evening with or without food.  It would be best to take the doses about the same time each day.  If you have difficulty swallowing the tablet whole please discuss with your pharmacist how to take the medication safely.  Take Eliquis exactly as prescribed by your doctor and DO NOT stop taking Eliquis without talking to the doctor who prescribed the medication.  Stopping may increase your risk of developing a new clot or stroke.  Refill your prescription before you run out.  After discharge, you should have regular check-up appointments with your healthcare provider that is prescribing your Eliquis.  In the future your dose may need to be changed if your kidney function or weight changes by a significant amount or as you get older.  What do you do if you miss a dose? If you miss a dose, take it as soon as you remember on the same day and resume taking twice daily.  Do not take more than one dose of ELIQUIS at the same time.  Important Safety Information A possible side effect of Eliquis is bleeding. You should call your healthcare provider right away if you experience any of the following: ? Bleeding from an injury or your nose that does not stop. ? Unusual colored urine (red or dark brown) or unusual colored stools (red or black). ? Unusual bruising for unknown reasons. ? A serious fall or if you hit  your head (even if there is no bleeding).  Some medicines may interact with Eliquis and might increase your risk of bleeding or clotting while on Eliquis. To help avoid this, consult your healthcare provider or pharmacist prior to using any new prescription or non-prescription medications, including herbals, vitamins, non-steroidal anti-inflammatory drugs (NSAIDs) and supplements.  This website has more information on Eliquis (apixaban): [email or web address].[email or web address].

## 2020-07-07 NOTE — Discharge Summary (Signed)
Name: Taylor Willis MRN: [id number] DOB: Jul 01, 1940 80 y.o. PCP: Taylor Rio, MD  Date of Admission: 07/05/2020 10:12 AM Date of Discharge:  07/07/20 Attending Physician: Sid Falcon, MD   Discharge Diagnosis: 1. 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 Atrial Fibrillation 3. ESRD On Dialysis  Discharge Medications: Allergies as of 07/07/2020      Reactions   Aspirin Other (See Comments)   GI irritation   Hydrochlorothiazide    Dizziness and unable to walk   Ibuprofen Other (See Comments)   GI irritation      Medication List    STOP taking these medications   Cinnamon 500 MG capsule     TAKE these medications   (feeding supplement) PROSource Plus liquid Take 30 mLs by mouth 2 (two) times daily between meals.   acetaminophen 500 MG tablet Commonly known as: TYLENOL Take 500 mg by mouth every 6 (six) hours as needed for moderate pain.   albuterol 108 (90 Base) MCG/ACT inhaler Commonly known as: VENTOLIN HFA Inhale 2 puffs into the lungs every 6 (six) hours as needed for wheezing.   allopurinol 100 MG tablet Commonly known as: ZYLOPRIM Take 100 mg by mouth daily as needed (gout).   apixaban 5 MG Tabs tablet Commonly known as: ELIQUIS Take 1 tablet (5 mg total) by mouth 2 (two) times daily.   calcitRIOL 0.25 MCG capsule Commonly known as: ROCALTROL Take 0.25 mcg by mouth 3 (three) times a week. MWF   calcium acetate 667 MG capsule Commonly known as: PHOSLO Take 667 mg by mouth 2 (two) times daily with a meal.   carvedilol 3.125 MG tablet Commonly known as: COREG Take 1 tablet (3.125 mg total) by mouth See admin instructions. Take 1 tablet in the evenings on Dialysis days (Tues, Thurs, and Sat), On Non-dialysis days take 1 tablet twice a day.   Cholecalciferol 25 MCG (1000 UT) tablet Take 1,000 Units by mouth daily.   vitamin B-12 1000 MCG  tablet Commonly known as: CYANOCOBALAMIN Take 1,000 mcg by mouth daily.            Discharge Care Instructions  (From admission, onward)         Start     Ordered   07/07/20 0000  Leave dressing on - Keep it clean, dry, and intact until clinic visit        07/07/20 1201          Disposition and follow-up:   Mr.Taylor Willis was discharged from [facility] in Stable condition.  At the hospital follow up visit please address:  1.  Follow up: Taylor Willis neurology in 4 weeks.  . With PCP in 1 week . Continue Home Health OT and PT . Follow up with Nephrology for your Dialysis needs.  2.  Labs / imaging needed at time of follow-up: CBC, BMP, PT, INR  3.  Pending labs/ test needing follow-up: HbA1c  Follow-up Appointments:  Follow-up Information    [facility] Follow up.   Why: for resumption of home health services Contact information: [place] [address] [phone]        [facility] Follow up in 4 week(s).   Specialty: Neurology Contact information: [address] [place] [phone]       Taylor Rio, MD. Schedule an appointment as soon as  possible for a visit in 1 week(s).   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 Pt came in with lower extremity weakness and expressive aphasia x 4 days.  CT head revealed ill-defined lesion noted on left temporal/occipital lobes. Neuro exam with focal neurologic deficits.  Neurology consulted.  MRI brain showed large acute cortical/subcortical infarct within the medial left temporal and occipital lobes as well as dorsal lateral left thalamus (left PCA territory).  Additional small patchy acute infarcts are present within the inferior right  cerebellar hemisphere.  MRA angio head showed  left posterior cerebral artery is poorly delineated beyond the P3/P4 junction, likely occluded an flow related signal is not definitively identified within the right PICA.  MRA neck showed Mild atherosclerotic plaque about the carotid bifurcations and within the proximal internal carotid arteries, bilaterally. Echo showed LVEF 50 to 55% Left atrial size was moderately dilated.  Incidental finding of atrial fibrillation noted on EKG.  Stroke etiology likely cardioembolic in the setting of atrial fibrillation.  Patient was started on Eliquis 5 mg twice daily for secondary prevention. Repeat Head CT shows stable infarcts. No Hemorrhage was seen. LDL 59,  PT/OT recommends Outpatient Neuro PT-24 hr assistance. OT recommended [facility]. Pt already doing Home health OT and PT. Recommended to continue [facility] OT/PT.  Patient with follow-up with neurology and his PCP.  # A fibrillation with Slow Ventricular response. Incidental finding of atrial fibrillation on EKG. Bradycardic to normal heart rate.   Pt was started on Eliquis 5 mg BID. Repeat CT head showed stable previous  infarcts with no new hemorrhage.   #ESRD on HD (TTS) Patient reported that he has been getting dialysis through tunneled catheter.  Recently had peritoneal access placed to transition to home dialysis. Dialysis continued per his normal schedule during inpatient.   Subjective on day of discharge: Pt seen at rounds. Pt states he is doing good. Denies any new weakness or complaints. Wife stated that he had difficulty sleeping last night and woke up several times at night. Explained that he has to take Eliquis regularly without missing any dose and he will at risk for bleeding.   Discharge Exam:   BP (!) 141/72 (BP Location: Right Arm)   Pulse (!) 57   Temp 98.4 F (36.9 C) (Oral)   Resp 18   Wt 78.9 kg   SpO2 99%   BMI 24.26 kg/m  Discharge exam: Physical Exam Vitals and nursing note reviewed.   Constitutional:      Appearance: Normal appearance.  HENT:     Head: Normocephalic.     Mouth/Throat:     Mouth: Mucous membranes are moist.  Eyes:     Pupils: Pupils are equal, round, and reactive to light.  Cardiovascular:     Rate and Rhythm: Bradycardia present. Rhythm irregular.  Pulmonary:     Effort: Pulmonary effort is normal.  Musculoskeletal:        General: No swelling.     Cervical back: Normal range of motion.     Right lower leg: No edema.     Left lower leg: No edema.  Skin:    General: Skin is warm and dry.     Coloration: Skin is not jaundiced.     Comments: Bandage covered in blood placed on Rt hand.  Nurse to change bandage.   Neurological:     Mental Status: He is alert.     Cranial Nerves: No cranial nerve deficit.     Comments:  Mental Status: Awake and Alert. Oriented to self only. Not able to tell month, year or current president.  Language: speech is slow  with some word finding difficulty and slow to respond to questions. comprehension intact. Cranial Nerves:   CN II-XII Intact.  Motor: Mild Drift in Rt UE., No Drift in LE's. Strength 5/5 in Rt UE, 5/5 in Lt UE, Strength in RT LE- 4/5 in Ankle otherwise 5/5, Lt LE 5/5 Tone: is normal and bulk is normal Sensation- Intact to light touch bilaterally Coordination: FTN Mildly Assymetrical on Rt UE likely secondary  to weakness. Gait- deferred    Pertinent Labs, Studies, and Procedures:  CT HEAD WO CONTRAST  Result Date: 07/05/2020 CLINICAL DATA:  Increased falls at home for 4 days with confusion and expressive aphasia. Neuro deficit, acute. Stroke suspected. EXAM: CT HEAD WITHOUT CONTRAST TECHNIQUE: Contiguous axial images were obtained from the base of the skull through the vertex without intravenous contrast. COMPARISON:  CT head 04/29/2020. FINDINGS: Brain: There is new ill-defined density medially in the left temporal lobe, extending posteriorly into the left occipital lobe white matter. The overlying  cortex appears intact. There is no evidence of acute intracranial hemorrhage, extra-axial fluid collection or midline shift. Patchy low-density in the periventricular white matter is otherwise stable, most consistent with chronic small vessel ischemic change. No hydrocephalus. Vascular: Intracranial vascular calcifications. No hyperdense vessel identified. Skull: Negative for fracture or focal lesion. Sinuses/Orbits: The visualized paranasal sinuses and mastoid air cells are clear. No orbital abnormalities are seen. Other: Previous bilateral lens surgery. IMPRESSION: 1. New ill-defined low-density medially in the left temporal and occipital lobes, most consistent with a subacute nonhemorrhagic stroke. Encephalitis considered less likely. Correlate clinically and consider MRI for further evaluation. 2. No evidence of acute intracranial hemorrhage, midline shift or hydrocephalus. 3. Otherwise stable chronic small vessel ischemic changes Electronically Signed   By: Richardean Sale M.D.   On: 07/05/2020 12:10   MR ANGIO HEAD WO CONTRAST  Addendum Date: 07/05/2020   ADDENDUM REPORT: 07/05/2020 16:05 ADDENDUM: These results were called by telephone at the time of interpretation on 07/05/2020 at 4:05 pm to provider Dr. Truman Hayward, who verbally acknowledged these results. Electronically Signed   By: Kellie Simmering DO   On: 07/05/2020 16:05   Result Date: 07/05/2020 CLINICAL DATA:  Neuro deficit, acute, stroke suspected. EXAM: MRI HEAD WITHOUT CONTRAST MRA HEAD WITHOUT CONTRAST MRA NECK WITHOUT CONTRAST TECHNIQUE: Multiplanar, multi-echo pulse sequences of the brain and surrounding structures were acquired without intravenous contrast. Angiographic images of the Circle of Willis were acquired using MRA technique without intravenous contrast. Angiographic images of the neck were acquired using MRA technique without intravenous contrast. Carotid stenosis measurements (when applicable) are obtained utilizing NASCET criteria, using the  distal internal carotid diameter as the denominator. COMPARISON:  Noncontrast head CT performed earlier today 07/05/2020. FINDINGS: MRI HEAD FINDINGS Brain: Mild intermittent motion degradation. Mild cerebral and cerebellar atrophy. Large acute cortical/subcortical infarct within the medial left temporal and occipital lobes as well as dorsal lateral left thalamus (left PCA vascular territory). Small foci of petechial hemorrhage are present within the infarction territory. Additional patchy small acute infarcts are present within the inferior right cerebellar hemisphere (PICA territory). A subtle focus of apparent diffusion weighted hyperintensity within the medial left cerebellar hemisphere is favored artifactual. Mild-to-moderate  multifocal T2/FLAIR hyperintensity within the cerebral white matter, nonspecific but compatible with chronic small vessel ischemic disease. No evidence of intracranial mass. No extra-axial fluid collection. No midline shift. Vascular: Separately reported on concurrently performed MRA head. Skull and upper cervical spine: No focal marrow lesion. Sinuses/Orbits: Visualized orbits show no acute finding. Bilateral lens replacements. Trace bilateral ethmoid sinus mucosal thickening. Small mucous retention cyst within the posterosuperior right maxillary sinus. MRA HEAD FINDINGS Anterior circulation: The intracranial internal carotid arteries are patent. The M1 middle cerebral arteries are patent. No M2 proximal branch occlusion or high-grade proximal stenosis is identified. The anterior cerebral arteries are patent. No intracranial aneurysm is identified. Posterior circulation: The intracranial vertebral arteries are patent. Flow related signal is not definitively identified within the right PICA. The basilar artery is patent. The left posterior cerebral artery is poorly delineated beyond the P3/P4 junction, likely occluded. The right posterior cerebral artery is patent. Posterior communicating  arteries are hypoplastic or absent bilaterally. Anatomic variants: As described MRA NECK FINDINGS Aortic arch: The aortic arch, common carotid artery origins, right innominate artery and very proximal subclavian arteries are excluded from the field of view. No hemodynamically significant stenosis within the visible subclavian arteries. Right carotid system: The visible common carotid and internal carotid arteries are patent within the neck without hemodynamically significant stenosis (50% or greater). Mild atherosclerotic plaque about the carotid bifurcation and within the proximal ICA. Left carotid system: The visible common carotid and internal carotid arteries are patent within the neck without hemodynamically significant stenosis (50% or greater). Mild atherosclerotic plaque about the carotid bifurcation and within the proximal ICA. Vertebral arteries: The vertebral arteries are codominant and patent with antegrade flow and without appreciable stenosis. IMPRESSION: MRI head: 1. Large acute cortical/subcortical infarct within the medial left temporal and occipital lobes, as well as dorsolateral left thalamus (left PCA vascular territory). Small foci of associated petechial hemorrhage. 2. Additional small patchy acute infarcts within the inferior right cerebellar hemisphere (PICA territory). 3. Mild-to-moderate cerebral white matter chronic small vessel ischemic disease. 4. Mild generalized parenchymal atrophy. MRA head: 1. The left posterior cerebral artery is poorly delineated beyond the P3/P4 junction, likely occluded. 2. Flow related signal is not definitively identified within the right PICA. 3. Elsewhere, no intracranial large vessel occlusion or proximal high-grade arterial stenosis is identified. MRA neck: 1. The aortic arch, common carotid artery origins, right innominate artery and very proximal subclavian arteries are excluded from the field of view. 2. The visible common carotid and internal carotid  arteries are patent within the neck without hemodynamically significant stenosis. Mild atherosclerotic plaque about the carotid bifurcations and within the proximal internal carotid arteries, bilaterally. 3. Vertebral arteries patent within the neck with antegrade flow and without stenosis. Electronically Signed: By: Kellie Simmering DO On: 07/05/2020 15:53   MR ANGIO NECK WO CONTRAST  Addendum Date: 07/05/2020   ADDENDUM REPORT: 07/05/2020 16:05 ADDENDUM: These results were called by telephone at the time of interpretation on 07/05/2020 at 4:05 pm to provider Dr. Truman Hayward, who verbally acknowledged these results. Electronically Signed   By: Kellie Simmering DO   On: 07/05/2020 16:05   Result Date: 07/05/2020 CLINICAL DATA:  Neuro deficit, acute, stroke suspected. EXAM: MRI HEAD WITHOUT CONTRAST MRA HEAD WITHOUT CONTRAST MRA NECK WITHOUT CONTRAST TECHNIQUE: Multiplanar, multi-echo pulse sequences of the brain and surrounding structures were acquired without intravenous contrast. Angiographic images of the Circle of Willis were acquired using MRA technique without intravenous contrast. Angiographic images of the neck were acquired  using MRA technique without intravenous contrast. Carotid stenosis measurements (when applicable) are obtained utilizing NASCET criteria, using the distal internal carotid diameter as the denominator. COMPARISON:  Noncontrast head CT performed earlier today 07/05/2020. FINDINGS: MRI HEAD FINDINGS Brain: Mild intermittent motion degradation. Mild cerebral and cerebellar atrophy. Large acute cortical/subcortical infarct within the medial left temporal and occipital lobes as well as dorsal lateral left thalamus (left PCA vascular territory). Small foci of petechial hemorrhage are present within the infarction territory. Additional patchy small acute infarcts are present within the inferior right cerebellar hemisphere (PICA territory). A subtle focus of apparent diffusion weighted hyperintensity within  the medial left cerebellar hemisphere is favored artifactual. Mild-to-moderate multifocal T2/FLAIR hyperintensity within the cerebral white matter, nonspecific but compatible with chronic small vessel ischemic disease. No evidence of intracranial mass. No extra-axial fluid collection. No midline shift. Vascular: Separately reported on concurrently performed MRA head. Skull and upper cervical spine: No focal marrow lesion. Sinuses/Orbits: Visualized orbits show no acute finding. Bilateral lens replacements. Trace bilateral ethmoid sinus mucosal thickening. Small mucous retention cyst within the posterosuperior right maxillary sinus. MRA HEAD FINDINGS Anterior circulation: The intracranial internal carotid arteries are patent. The M1 middle cerebral arteries are patent. No M2 proximal branch occlusion or high-grade proximal stenosis is identified. The anterior cerebral arteries are patent. No intracranial aneurysm is identified. Posterior circulation: The intracranial vertebral arteries are patent. Flow related signal is not definitively identified within the right PICA. The basilar artery is patent. The left posterior cerebral artery is poorly delineated beyond the P3/P4 junction, likely occluded. The right posterior cerebral artery is patent. Posterior communicating arteries are hypoplastic or absent bilaterally. Anatomic variants: As described MRA NECK FINDINGS Aortic arch: The aortic arch, common carotid artery origins, right innominate artery and very proximal subclavian arteries are excluded from the field of view. No hemodynamically significant stenosis within the visible subclavian arteries. Right carotid system: The visible common carotid and internal carotid arteries are patent within the neck without hemodynamically significant stenosis (50% or greater). Mild atherosclerotic plaque about the carotid bifurcation and within the proximal ICA. Left carotid system: The visible common carotid and internal carotid  arteries are patent within the neck without hemodynamically significant stenosis (50% or greater). Mild atherosclerotic plaque about the carotid bifurcation and within the proximal ICA. Vertebral arteries: The vertebral arteries are codominant and patent with antegrade flow and without appreciable stenosis. IMPRESSION: MRI head: 1. Large acute cortical/subcortical infarct within the medial left temporal and occipital lobes, as well as dorsolateral left thalamus (left PCA vascular territory). Small foci of associated petechial hemorrhage. 2. Additional small patchy acute infarcts within the inferior right cerebellar hemisphere (PICA territory). 3. Mild-to-moderate cerebral white matter chronic small vessel ischemic disease. 4. Mild generalized parenchymal atrophy. MRA head: 1. The left posterior cerebral artery is poorly delineated beyond the P3/P4 junction, likely occluded. 2. Flow related signal is not definitively identified within the right PICA. 3. Elsewhere, no intracranial large vessel occlusion or proximal high-grade arterial stenosis is identified. MRA neck: 1. The aortic arch, common carotid artery origins, right innominate artery and very proximal subclavian arteries are excluded from the field of view. 2. The visible common carotid and internal carotid arteries are patent within the neck without hemodynamically significant stenosis. Mild atherosclerotic plaque about the carotid bifurcations and within the proximal internal carotid arteries, bilaterally. 3. Vertebral arteries patent within the neck with antegrade flow and without stenosis. Electronically Signed: By: Kellie Simmering DO On: 07/05/2020 15:53   MR BRAIN WO CONTRAST  Addendum Date: 07/05/2020   ADDENDUM REPORT: 07/05/2020 16:05 ADDENDUM: These results were called by telephone at the time of interpretation on 07/05/2020 at 4:05 pm to provider Dr. Truman Hayward, who verbally acknowledged these results. Electronically Signed   By: Kellie Simmering DO   On:  07/05/2020 16:05   Result Date: 07/05/2020 CLINICAL DATA:  Neuro deficit, acute, stroke suspected. EXAM: MRI HEAD WITHOUT CONTRAST MRA HEAD WITHOUT CONTRAST MRA NECK WITHOUT CONTRAST TECHNIQUE: Multiplanar, multi-echo pulse sequences of the brain and surrounding structures were acquired without intravenous contrast. Angiographic images of the Circle of Willis were acquired using MRA technique without intravenous contrast. Angiographic images of the neck were acquired using MRA technique without intravenous contrast. Carotid stenosis measurements (when applicable) are obtained utilizing NASCET criteria, using the distal internal carotid diameter as the denominator. COMPARISON:  Noncontrast head CT performed earlier today 07/05/2020. FINDINGS: MRI HEAD FINDINGS Brain: Mild intermittent motion degradation. Mild cerebral and cerebellar atrophy. Large acute cortical/subcortical infarct within the medial left temporal and occipital lobes as well as dorsal lateral left thalamus (left PCA vascular territory). Small foci of petechial hemorrhage are present within the infarction territory. Additional patchy small acute infarcts are present within the inferior right cerebellar hemisphere (PICA territory). A subtle focus of apparent diffusion weighted hyperintensity within the medial left cerebellar hemisphere is favored artifactual. Mild-to-moderate multifocal T2/FLAIR hyperintensity within the cerebral white matter, nonspecific but compatible with chronic small vessel ischemic disease. No evidence of intracranial mass. No extra-axial fluid collection. No midline shift. Vascular: Separately reported on concurrently performed MRA head. Skull and upper cervical spine: No focal marrow lesion. Sinuses/Orbits: Visualized orbits show no acute finding. Bilateral lens replacements. Trace bilateral ethmoid sinus mucosal thickening. Small mucous retention cyst within the posterosuperior right maxillary sinus. MRA HEAD FINDINGS Anterior  circulation: The intracranial internal carotid arteries are patent. The M1 middle cerebral arteries are patent. No M2 proximal branch occlusion or high-grade proximal stenosis is identified. The anterior cerebral arteries are patent. No intracranial aneurysm is identified. Posterior circulation: The intracranial vertebral arteries are patent. Flow related signal is not definitively identified within the right PICA. The basilar artery is patent. The left posterior cerebral artery is poorly delineated beyond the P3/P4 junction, likely occluded. The right posterior cerebral artery is patent. Posterior communicating arteries are hypoplastic or absent bilaterally. Anatomic variants: As described MRA NECK FINDINGS Aortic arch: The aortic arch, common carotid artery origins, right innominate artery and very proximal subclavian arteries are excluded from the field of view. No hemodynamically significant stenosis within the visible subclavian arteries. Right carotid system: The visible common carotid and internal carotid arteries are patent within the neck without hemodynamically significant stenosis (50% or greater). Mild atherosclerotic plaque about the carotid bifurcation and within the proximal ICA. Left carotid system: The visible common carotid and internal carotid arteries are patent within the neck without hemodynamically significant stenosis (50% or greater). Mild atherosclerotic plaque about the carotid bifurcation and within the proximal ICA. Vertebral arteries: The vertebral arteries are codominant and patent with antegrade flow and without appreciable stenosis. IMPRESSION: MRI head: 1. Large acute cortical/subcortical infarct within the medial left temporal and occipital lobes, as well as dorsolateral left thalamus (left PCA vascular territory). Small foci of associated petechial hemorrhage. 2. Additional small patchy acute infarcts within the inferior right cerebellar hemisphere (PICA territory). 3.  Mild-to-moderate cerebral white matter chronic small vessel ischemic disease. 4. Mild generalized parenchymal atrophy. MRA head: 1. The left posterior cerebral artery is poorly delineated beyond the P3/P4 junction, likely  occluded. 2. Flow related signal is not definitively identified within the right PICA. 3. Elsewhere, no intracranial large vessel occlusion or proximal high-grade arterial stenosis is identified. MRA neck: 1. The aortic arch, common carotid artery origins, right innominate artery and very proximal subclavian arteries are excluded from the field of view. 2. The visible common carotid and internal carotid arteries are patent within the neck without hemodynamically significant stenosis. Mild atherosclerotic plaque about the carotid bifurcations and within the proximal internal carotid arteries, bilaterally. 3. Vertebral arteries patent within the neck with antegrade flow and without stenosis. Electronically Signed: By: Kellie Simmering DO On: 07/05/2020 15:53   ECHOCARDIOGRAM COMPLETE  Result Date: 07/05/2020    ECHOCARDIOGRAM REPORT   Patient Name:   TABARI VOLKERT [facility] Date of Exam: 07/05/2020 Medical Rec #:  [id number]     Height:       71.0 in Accession #:    [id number]    Weight:       180.4 lb Date of Birth:  December 28, 1940     BSA:          2.018 m Patient Age:    36 years      BP:           161/71 mmHg Patient Gender: M             HR:           50 bpm. Exam Location:  Inpatient Procedure: 2D Echo, Cardiac Doppler and Color Doppler Indications:    I48.91* Unspeicified atrial fibrillation  History:        Patient has no prior history of Echocardiogram examinations.                 Stroke; Risk Factors:Former Smoker, Hypertension and                 Dyslipidemia.  Sonographer:    Merrie Roof RDCS Referring Phys: Graham  1. Left ventricular ejection fraction, by estimation, is 50 to 55%. The left ventricle has low normal function. The left ventricle has no regional wall motion  abnormalities. Left ventricular diastolic function could not be evaluated.  2. Right ventricular systolic function is low normal. The right ventricular size is normal.  3. Left atrial size was moderately dilated.  4. The mitral valve is grossly normal. Mild mitral valve regurgitation. No evidence of mitral stenosis.  5. The aortic valve is tricuspid. Aortic valve regurgitation is trivial. No aortic stenosis is present. FINDINGS  Left Ventricle: Left ventricular ejection fraction, by estimation, is 50 to 55%. The left ventricle has low normal function. The left ventricle has no regional wall motion abnormalities. The left ventricular internal cavity size was normal in size. There is no left ventricular hypertrophy. Left ventricular diastolic function could not be evaluated due to atrial fibrillation. Left ventricular diastolic function could not be evaluated. Right Ventricle: The right ventricular size is normal. No increase in right ventricular wall thickness. Right ventricular systolic function is low normal. Left Atrium: Left atrial size was moderately dilated. Right Atrium: Right atrial size was normal in size. Pericardium: Trivial pericardial effusion is present. Presence of pericardial fat pad. Mitral Valve: The mitral valve is grossly normal. Mild mitral valve regurgitation. No evidence of mitral valve stenosis. Tricuspid Valve: The tricuspid valve is grossly normal. Tricuspid valve regurgitation is mild . No evidence of tricuspid stenosis. Aortic Valve: The aortic valve is tricuspid. Aortic valve regurgitation is trivial. No aortic stenosis is present. Pulmonic Valve:  The pulmonic valve was grossly normal. Pulmonic valve regurgitation is trivial. No evidence of pulmonic stenosis. Aorta: The aortic root and ascending aorta are structurally normal, with no evidence of dilitation. Venous: The inferior vena cava was not well visualized. IAS/Shunts: The atrial septum is grossly normal.  LEFT VENTRICLE PLAX 2D  LVIDd:         5.50 cm LVIDs:         4.10 cm LV PW:         1.10 cm LV IVS:        1.10 cm LVOT diam:     2.20 cm LV SV:         59 LV SV Index:   29 LVOT Area:     3.80 cm  RIGHT VENTRICLE RV Basal diam:  3.80 cm RV S prime:     8.27 cm/s TAPSE (M-mode): 1.7 cm LEFT ATRIUM              Index       RIGHT ATRIUM           Index LA diam:        4.80 cm  2.38 cm/m  RA Area:     23.00 cm LA Vol (A2C):   140.0 ml 69.37 ml/m RA Volume:   64.60 ml  32.01 ml/m LA Vol (A4C):   89.2 ml  44.20 ml/m LA Biplane Vol: 121.0 ml 59.96 ml/m  AORTIC VALVE LVOT Vmax:   76.40 cm/s LVOT Vmean:  52.000 cm/s LVOT VTI:    0.155 m  AORTA Ao Root diam: 3.90 cm Ao Asc diam:  3.70 cm TRICUSPID VALVE TR Peak grad:   43.6 mmHg TR Vmax:        330.00 cm/s  SHUNTS Systemic VTI:  0.16 m Systemic Diam: 2.20 cm Eleonore Chiquito MD Electronically signed by Eleonore Chiquito MD Signature Date/Time: 07/05/2020/6:50:38 PM    Final     Discharge Instructions: Discharge Instructions    Call MD for:  difficulty breathing, headache or visual disturbances   Complete by: As directed    Call MD for:  persistant dizziness or light-headedness   Complete by: As directed    Call MD for:  severe uncontrolled pain   Complete by: As directed    Call MD for:  temperature >100.4   Complete by: As directed    Diet - low sodium heart healthy   Complete by: As directed    Face-to-face encounter (required for Medicare/Medicaid patients)   Complete by: As directed    I Armando Reichert certify that this patient is under my care and that I, or a nurse practitioner or physician's assistant working with me, had a face-to-face encounter that meets the physician face-to-face encounter requirements with this patient on 07/07/2020. The encounter with the patient was in whole, or in part for the following medical condition(s) which is the primary reason for home health care (List medical condition): Recent stroke, H/O fall, Balance issues.   The encounter with the patient  was in whole, or in part, for the following medical condition, which is the primary reason for home health care: Stroke   I certify that, based on my findings, the following services are medically necessary home health services: Physical therapy   Reason for Medically Necessary Home Health Services: Therapy- Personnel officer, Training and development officer and Stair Training   My clinical findings support the need for the above services: Unsafe ambulation due to balance issues   Further, I certify  that my clinical findings support that this patient is homebound due to: Unsafe ambulation due to balance issues   Home Health   Complete by: As directed    To provide the following care/treatments:  PT OT     Increase activity slowly   Complete by: As directed    Leave dressing on - Keep it clean, dry, and intact until clinic visit   Complete by: As directed      Dear Remo Lipps,   Thank you for letting us participate in your care! In this section, you will find a brief hospital admission summary of why you were admitted to the hospital, what happened during your admission, your diagnosis/diagnoses, and recommended follow up.   You were admitted because you were experiencing weakness in lower extrimities .   Your testing revealed stroke and Atrial fibrillation.   You were diagnosed with stroke.  You were started on Eliquis.   You were also seen by Neurology. They recommended Eliquis.   You were also seen by PT and OT. They recommended Home health OT and Outpatient Neuro PT.  Your symptoms improved and you were discharged from the hospital for meeting this goal.   POST-HOSPITAL & CARE INSTRUCTIONS 1.  Please take Eliquis twice daily as advised. It can increase risk for bleeding. Please go to the ER if you notice any bleeding.  2. Follow up with [facility] Neurology in 4 weeks.  3. Please let PCP/Specialists know of any changes in medications that were made.  4. Please see medications section of this  packet for any medication changes.  5. Follow up with Nephrology for your Dialysis needs.  DOCTOR'S APPOINTMENTS & FOLLOW UP No future appointments.  Thank you for choosing [facility]! Take care and be well!  [place]; [facility]  [place], [place] [address] [phone]   Signed: Armando Reichert, MD 07/07/2020, 12:01 PM   Pager: [phone] After 5 pm on weekdays and 1 pm on weekends/holidays- Please call on call pager at [phone]

## 2020-07-07 NOTE — Progress Notes (Signed)
[facility] Progress Note   Subjective:  Seen in room. Wife at bedside. Tolerated dialysis yesterday without issue. No complaints this am.   Objective Vitals:   07/06/20 2352 07/07/20 0617 07/07/20 0640 07/07/20 0815  BP: (!) 132/41 (!) 170/69 (!) 154/74 (!) 141/72  Pulse: 65 (!) 52 70 (!) 57  Resp: 17 18  18   Temp: 98.6 F (37 C) 98 F (36.7 C)  98.4 F (36.9 C)  TempSrc: Oral Oral  Oral  SpO2: 98% 98%  99%  Weight:         Additional Objective Labs: Basic Metabolic Panel: Recent Labs  Lab 07/05/20 1028 07/05/20 1046 07/06/20 0112 07/07/20 0311  NA 139 140 140 136  K 3.1* 3.4* 3.2* 3.3*  CL 102 99 102 99  CO2 29  --  30 28  GLUCOSE 98 98 142* 86  BUN 19 24* 25* 14  CREATININE 4.47* 4.50* 5.01* 3.76*  CALCIUM 8.7*  --  8.5* 8.5*  PHOS  --   --  3.4 2.6   CBC: Recent Labs  Lab 07/05/20 1028 07/05/20 1046 07/06/20 0112  WBC 6.6  --  6.6  NEUTROABS 4.5  --   --   HGB 10.6* 11.6* 10.2*  HCT 36.2* 34.0* 33.8*  MCV 98.9  --  96.6  PLT 148*  --  135*   Blood Culture    Component Value Date/Time   SDES  04/29/2020 0236    URINE, CLEAN CATCH Performed at [facility], [address] [place], [place] [address]    [facility]  04/29/2020 0236    NONE Performed at [facility], [address], [place], [place] [address]    CULT (A) 04/29/2020 0236    <10,000 COLONIES/mL INSIGNIFICANT GROWTH Performed at [facility], [place] [address], [place], [place] [address]    REPTSTATUS 04/30/2020 FINAL 04/29/2020 0236     Physical Exam General: Elderly man, nad  Heart: RRR No m,r,g  Lungs: Clear bilaterally  Abdomen: soft; abd binder in place  Extremities: No sig LE edema  Dialysis Access: R IJ TDC in use; LUE AVF +bruit   Medications:  . apixaban  5 mg Oral BID  . [START ON 07/08/2020] calcitRIOL  0.25 mcg Oral Once per day on Mon Wed Fri  . calcium acetate  667 mg Oral BID WC  . Chlorhexidine Gluconate Cloth  6 each Topical Daily  .  cholecalciferol  1,000 Units Oral Daily  . vitamin B-12  1,000 mcg Oral Daily    Dialysis Orders: [facility] TTS. Unable to reach   Assessment/Plan: 1. 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 New onset AFib  - Bradycardic on admission. Holding BB. Eliquis started --per primary  3. ESRD -  HD TTS. Continue on schedule.  Next HD 6/7 . Added K+ bath  4. Dialysis access -- Has TDC and LUE AVF which is mature per VVS notes. He says using both at his center. Also has recently placed PD catheter on 5/26 - will ask dialysis RN to flush while here.  5. Hypertension/volume  - BP improving.  Permissive HTN in the setting of CVA.  6. Anemia  - Hgb 11.6 >10.2 . No ESA currently. Follow trends.  7. Metabolic bone disease -  Ca/Phos ok.  Continue home binders, calcitriol TIW.  8. Nutrition -On renal diet/Fluid restrictions. Add prot supp. Consider liberalizing diet. Watch K/Phos trends.    Thomos Lemons  [employer] PA-C [employer] 07/07/2020,9:54 AM

## 2020-07-07 NOTE — Progress Notes (Addendum)
Stroke Progress Note Interval History:  Pt seen in room today with wife at bedside. He is not as confused today and more interactive. Neuro exam stable. F/u CTH stable this am. Reviewed dx and tx plan and stroke education with wife at bedside.    ROS:  History obtained from the patient  General ROS: negative for - chills, fatigue, fever, night sweats, weight gain or weight loss Psychological ROS: negative for - , hallucinations, memory difficulties, mood swings or  Ophthalmic ROS: negative for - blurry vision, double vision, eye pain or loss of vision ENT ROS: negative for - epistaxis, nasal discharge, oral lesions, sore throat, tinnitus or vertigo Respiratory ROS: negative for - cough,  shortness of breath or wheezing Cardiovascular ROS: negative for - chest pain, dyspnea on exertion,  Gastrointestinal ROS: negative for - abdominal pain, diarrhea,  nausea/vomiting or stool incontinence Genito-Urinary ROS: negative for - dysuria, hematuria, incontinence or urinary frequency/urgency Musculoskeletal ROS: negative for - joint swelling or muscular weakness Neurological ROS: as noted in HPI   General Examination:  Constitutional: Elderly Caucasian man in no acute distress Cardiovascular: RRR Respiratory: Unlabored respirations   Neurological Examination Mental Status: Oriented to self and place, gets month/year correct with clues looking at board in room. Gives his dob, but says his age is 80. Follows commands. Speech: Fluent with repetition and naming intact. No dysarthria  Cranial Nerves: EOMI, right homonymous hemianopsia, Face symmetric, Tongue midline  Motor: Normal bulk and tone. RUE seems to be 4+ to 5/5; did not extensively assess given arm pain and bandages in the setting of fall.  LUE 5/5.  RLE 5/5 aside from dorsi flexion which is 2/5.  LLE 5/5  Sensory: Sensation intact to light touch throughout  Deep Tendon Reflexes: 3+ and symmetric throughout. Withdraws with  plantars Coordination: Tremor noted to right hand. FNF + HTS intact.  Gait: Deferred    Pertinent Imaging and Labwork 07/05/20 CT Head WO Contrast 1. New ill-defined low-density medially in the left temporal and occipital lobes, most consistent with a subacute nonhemorrhagic stroke. Encephalitis considered less likely. Correlate clinically and consider MRI for further evaluation. 2. No evidence of acute intracranial hemorrhage, midline shift or hydrocephalus. 3. Otherwise stable chronic small vessel ischemic changes  07/05/20 MRA Angio Head and Neck WO Contrast  MRA head: 1. The left posterior cerebral artery is poorly delineated beyond the P3/P4 junction, likely occluded. 2. Flow related signal is not definitively identified within the right PICA. 3. Elsewhere, no intracranial large vessel occlusion or proximal high-grade arterial stenosis is identified.  MRA neck: 1. The aortic arch, common carotid artery origins, right innominate artery and very proximal subclavian arteries are excluded from the field of view. 2. The visible common carotid and internal carotid arteries are patent within the neck without hemodynamically significant stenosis. Mild atherosclerotic plaque about the carotid bifurcations and within the proximal internal carotid arteries, bilaterally. 3. Vertebral arteries patent within the neck with antegrade flow and without stenosis.  07/05/20 MRI Brain WO Contrast  Large acute cortical/subcortical infarct within the medial left temporal and occipital lobes as well as dorsal lateral left thalamus (left PCA vascular territory). Small foci of petechial hemorrhage are present within the infarction territory. Additional patchy small acute infarcts are present within the inferior right cerebellar hemisphere (PICA territory). A subtle focus of apparent diffusion weighted hyperintensity within the medial left cerebellar hemisphere is favored artifactual.  Assessment: 80 y.o. male  w/pmh of ESRD on TuThSa dialysis, HTN, HLD prostate cancer s/p prostectomy  10 years prior, 1st degree AV block, and asthma who presents with speech difficulty and falls found to have a L PCA + R PICA stroke for which neurology was consulted. Stroke Risk Factors - Cancer history, CKD, HLD and HTN  1. 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 Stroke etiology is cardioembolic  -MRI head: Large acute cortical/subcortical infarct within the medial left temporal and occipital lobes, as well as dorsolateral left thalamus (left PCA vascular territory). Small foci of associated petechial hemorrhage. Additional small patchy acute infarcts within the inferior right cerebellar hemisphere (PICA territory). Mild-to-moderate cerebral white matter chronic small vessel ischemic disease. Mild generalized parenchymal atrophy.  -TTE: Left ventricular ejection fraction, by estimation, is 50 to 55%. The left ventricle has low normal function with no regional wall motion abnormalities. Left atrial size is moderately dilated. No mural thrombus or valvular vegetation described in the report.   -LDL is 59, far under goal <70. Not on statin at home. No indication to start statin at this time.  -No h/o DM2 and previous A1C was 5.5  2. Afib- He was started on Eliquis 6/3, since he was 4 days out from stroke at that time, with minimal petechial hemorrhage in a portion of the infarction, it is safe and efficacious to start anticoagulation now based on overall risk/benefit profile. F/U CTH this am stable and no hemorrhage.   Recommendations:  -Stroke education with pt/wife at bedsdie -Continue Eliquis for secondary stroke prevention -Rehab as able -Out pt f/u with [facility] placed in depart. [place] for [employer] from Pharmacist, hospital.  We will s/o. Please call back if needed.   I have spent 61min in discussion with pt/family in review of imaging, stroke dx, tx plan and education and f/u plan.   Natacia Chaisson Metzger-Cihelka, ARNP-C, ANVP-BC Pager: [phone] 07/07/2020, 11:05 AM

## 2020-07-07 NOTE — TOC Transition Note (Signed)
Transition of Care [facility]) - CM/SW Discharge Note   Patient Details  Name: Taylor Willis MRN: [id number] Date of Birth: [birth year]  Transition of Care [facility]) CM/SW Contact:  Carles Collet, RN Phone Number: 07/07/2020, 9:32 AM   Clinical Narrative:   Spoke w patient and spouse at the bedside. Provided with 30 day eliquis card. Discussed OP PT, and they inform me that he is active w a [place] for PT OT and would like to continue. They could not remember name of company. Ping indicates [employer]. Will verify w liaison. [place] PT OT orders added for resumption of services    Final next level of care: Home w Home Health Services Barriers to Discharge: No Barriers Identified   Patient Goals and CMS Choice Patient states their goals for this hospitalization and ongoing recovery are:: to go home CMS Medicare.gov Compare Post Acute Care list provided to:: Patient Choice offered to / list presented to : [facility]  Discharge Placement                       Discharge Plan and Services                DME Arranged: N/A         HH Arranged: PT,OT [place] Agency: [place] Date [facility] Agency Contacted: 07/07/20 Time HH Agency Contacted: 0930 Representative spoke with at [place]: [place] Interventions     Readmission Risk Interventions No flowsheet data found.

## 2020-07-07 NOTE — Progress Notes (Signed)
Discharge instructions reviewed and given to pt and pt wife, pt acknowledge some understanding/pt wife acknowledge full understanding. Instructed wife to call primary md office with daily bp readings and when to restart bp meds. Discussed benefits of HHN for right arm skin tears and bp assessments. Encouraged wife to consider HHN in addition to PT. Pt wife observed this rn perform dressing change, wife states HD nurses have been changing dressing during HD. Wife will notify primary md/HH agency if she needs nursing assistance

## 2020-07-08 LAB — HEMOGLOBIN A1C
Hgb A1c MFr Bld: 5.4 % (ref 4.8–5.6)
Mean Plasma Glucose: 108 mg/dL

## 2020-07-08 NOTE — TOC Benefit Eligibility Note (Signed)
Transition of Care [facility]) Benefit Eligibility Note    Patient Details  Name: Taylor Willis MRN: [id number] Date of Birth: September 24, 1940   Medication/Dose: Eliquis 5mg . bid for 30 day supply  Covered?: No  Tier:  (tier 4)  Prescription Coverage Preferred Pharmacy: [facility] with Person/Company/Phone Number:: Cristina Gong. [place] PH# [phone]  Co-Pay: $254.94  Prior Approval: Yes [phone])  Deductible: Unmet       Shelda Altes Phone Number: 07/08/2020, 3:51 PM

## 2020-08-19 ENCOUNTER — Encounter: Payer: Self-pay | Admitting: Neurology

## 2020-08-19 ENCOUNTER — Ambulatory Visit (INDEPENDENT_AMBULATORY_CARE_PROVIDER_SITE_OTHER): Payer: Medicare Other | Admitting: Neurology

## 2020-08-19 VITALS — BP 142/68 | HR 51 | Ht 69.0 in

## 2020-08-19 DIAGNOSIS — I63432 Cerebral infarction due to embolism of left posterior cerebral artery: Secondary | ICD-10-CM | POA: Diagnosis not present

## 2020-08-19 DIAGNOSIS — I6310 Cerebral infarction due to embolism of unspecified precerebral artery: Secondary | ICD-10-CM

## 2020-08-19 NOTE — Progress Notes (Signed)
Reason for visit: Stroke, atrial fibrillation  Referring physician: [facility]  Taylor Willis is a 80 y.o. male  History of present illness:  Taylor Willis is a 80 year old right-handed white male with a history of atrial fibrillation documented in March 2022.  The patient has a history of end-stage renal disease, he went on hemodialysis towards the end of February 2022.  He will be converting to peritoneal dialysis in the near future.  Unfortunately, he sustained a stroke event that occurred about a week prior to his hospitalization on 05 July 2020.  A week prior, he had tried to get up out of bed to go to the bathroom and fell and could not get up, EMS was called.  Since that time, he was having troubles with slight right-sided weakness and some language changes and memory changes.  He had a work-up in the hospital that revealed a left posterior cerebral artery distribution stroke, he was in atrial fibrillation.  Initially he was placed on Eliquis but this was switched over to Xarelto.  He was seen by cardiology.  He has continued to have some mild word finding problems, he has some mild right-sided weakness, he does have some gait instability but he is getting physical therapy at home.  He denies any headache or dizziness.  He reports no numbness or weakness of the extremities.  He does report some vision changes since the stroke.  He comes here for further evaluation.  He does not smoke cigarettes.  Past Medical History:  Diagnosis Date   Arthritis    gout   Asthma    has rescue inhaler   Cancer ([place])    prostate   Chronic kidney disease    GERD (gastroesophageal reflux disease)    H/O peptic ulcer 1970's   Herpes 04/28/2020   Hyperlipidemia    Hypertension    Ureteral stone    Wears dentures    Wears glasses     Past Surgical History:  Procedure Laterality Date   AV FISTULA PLACEMENT Left 02/15/2019   Procedure: ARTERIOVENOUS (AV) FISTULA CREATION LEFT ARM;  Surgeon: Rosetta Posner, MD;  [facility];  Service: Vascular;  Laterality: Left;   CATARACT EXTRACTION W/ INTRAOCULAR LENS  IMPLANT, BILATERAL     CYSTOSCOPY/RETROGRADE/URETEROSCOPY/STONE EXTRACTION WITH BASKET Right 05/23/2012   Procedure: CYSTOSCOPY/RETROGRADE/URETEROSCOPY/STONE EXTRACTION WITH BASKET;  Surgeon: Bernestine Amass, MD;  Location: [facility];  Service: Urology;  Laterality: Right;   FRACTURE SURGERY Right    as a child   HERNIA REPAIR     HOLMIUM LASER APPLICATION Right 5/40/9811   Procedure: HOLMIUM LASER APPLICATION;  Surgeon: Bernestine Amass, MD;  Location: [facility];  Service: Urology;  Laterality: Right;   INSERTION OF DIALYSIS CATHETER Right 04/08/2020   Procedure: INSERTION OF TUNNELED DIALYSIS CATHETER;  Surgeon: Virl Cagey, MD;  Location: [facility];  Service: General;  Laterality: Right;   LIGATION OF ARTERIOVENOUS  FISTULA Left 09/12/2019   Procedure: SIDE BRANCH LIGATION  OF LEFT ARM ARTERIOVENOUS FISTULA;  Surgeon: Elam Dutch, MD;  Location: [facility];  Service: Vascular;  Laterality: Left;   MULTIPLE TOOTH EXTRACTIONS     PERIPHERAL VASCULAR BALLOON ANGIOPLASTY  05/22/2020   Procedure: PERIPHERAL VASCULAR BALLOON ANGIOPLASTY;  Surgeon: Cherre Robins, MD;  Location: [facility];  Service: Cardiovascular;;  Left arm fistula   REVISON OF ARTERIOVENOUS FISTULA Left 09/12/2019   Procedure: REVISON OF LEFT ARM ARTERIOVENOUS FISTULA;  Surgeon:  Elam Dutch, MD;  Location: [place];  Service: Vascular;  Laterality: Left;   ROBOT ASSISTED LAPAROSCOPIC RADICAL PROSTATECTOMY  2011   TONSILLECTOMY      No family history on file.  Social history:  reports that he quit smoking about 38 years ago. His smoking use included cigarettes. He has never used smokeless tobacco. He reports previous alcohol use of about 1.0 standard drink of alcohol per week. He reports that he does not use drugs.  Medications:  Prior to Admission medications    Medication Sig Start Date End Date Taking? Authorizing Provider  acetaminophen (TYLENOL) 500 MG tablet Take 500 mg by mouth every 6 (six) hours as needed for moderate pain.   Yes [provider]  albuterol (PROVENTIL HFA;VENTOLIN HFA) 108 (90 BASE) MCG/ACT inhaler Inhale 2 puffs into the lungs every 6 (six) hours as needed for wheezing.   Yes [provider]  allopurinol (ZYLOPRIM) 100 MG tablet Take 100 mg by mouth daily as needed (gout). 12/04/19  Yes [provider]  apixaban (ELIQUIS) 5 MG TABS tablet Take 1 tablet (5 mg total) by mouth 2 (two) times daily. 07/07/20  Yes Armando Reichert, MD  calcitRIOL (ROCALTROL) 0.25 MCG capsule Take 0.25 mcg by mouth 3 (three) times a week. MWF 03/21/20  Yes [provider]  calcium acetate (PHOSLO) 667 MG capsule Take 667 mg by mouth 2 (two) times daily with a meal.   Yes [provider]  carvedilol (COREG) 3.125 MG tablet Take 1 tablet (3.125 mg total) by mouth See admin instructions. Take 1 tablet in the evenings on Dialysis days (Tues, Thurs, and Sat), On Non-dialysis days take 1 tablet twice a day. 04/29/20  Yes Roxan Hockey, MD  Cholecalciferol 25 MCG (1000 UT) tablet Take 1,000 Units by mouth daily.    Yes [provider]  vitamin B-12 (CYANOCOBALAMIN) 1000 MCG tablet Take 1,000 mcg by mouth daily.   Yes [provider]      Allergies  Allergen Reactions   Aspirin Other (See Comments)    GI irritation    Hydrochlorothiazide     Dizziness and unable to walk   Ibuprofen Other (See Comments)    GI irritation     ROS:  Out of a complete 14 system review of symptoms, the patient complains only of the following symptoms, and all other reviewed systems are negative.  Walking difficulty Vision change Memory problem  Blood pressure (!) 142/68, pulse (!) 51, height 5\' 9"  (1.753 m).  Physical Exam  General: The patient is alert and cooperative at the time of the  examination.  Eyes: Pupils are equal, round, and reactive to light. Discs are flat bilaterally.  Neck: The neck is supple, no carotid bruits are noted.  Respiratory: The respiratory examination is clear.  Cardiovascular: The cardiovascular examination reveals an irregular rate and rhythm, no obvious murmurs or rubs are noted.  Skin: Extremities are with 1-2+ edema below the knees bilaterally.  Neurologic Exam  Mental status: The patient is alert and oriented x 3 at the time of the examination. The patient has apparent normal recent and remote memory, with an apparently normal attention span and concentration ability.  Cranial nerves: Facial symmetry is present. There is good sensation of the face to pinprick and soft touch bilaterally. The strength of the facial muscles and the muscles to head turning and shoulder shrug are normal bilaterally. Speech is well enunciated, no aphasia or dysarthria is noted. Extraocular movements are full. Visual fields  are notable for a very dense right homonymous visual field deficit.  The tongue is midline, and the patient has symmetric elevation of the soft palate. No obvious hearing deficits are noted.  Motor: The motor testing reveals 5 over 5 strength of all 4 extremities, with exception of a foot drop noted on the right and possibly some mild weakness of intrinsic muscles of the right hand. Good symmetric motor tone is noted throughout.  Sensory: Sensory testing is intact to pinprick, soft touch, vibration sensation, and position sense on all 4 extremities, with exception of decreased position sense in the right foot. No evidence of extinction is noted.  Coordination: Cerebellar testing reveals good finger-nose-finger bilaterally.  With heel-to-shin, the patient can perform on the right leg, unable to perform on the left with severe apraxia.  Gait and station: Gait is wide-based, the patient can walk with a examiner.  Tandem gait was not attempted.   Romberg is positive, the patient goes backwards.  Reflexes: Deep tendon reflexes are symmetric, but are decreased bilaterally. Toes are downgoing bilaterally.   MRI brain/ MRA head and neck 07/05/20:  IMPRESSION: MRI head:   1. Large acute cortical/subcortical infarct within the medial left temporal and occipital lobes, as well as dorsolateral left thalamus (left PCA vascular territory). Small foci of associated petechial hemorrhage. 2. Additional small patchy acute infarcts within the inferior right cerebellar hemisphere (PICA territory). 3. Mild-to-moderate cerebral white matter chronic small vessel ischemic disease. 4. Mild generalized parenchymal atrophy.   MRA head:   1. The left posterior cerebral artery is poorly delineated beyond the P3/P4 junction, likely occluded. 2. Flow related signal is not definitively identified within the right PICA. 3. Elsewhere, no intracranial large vessel occlusion or proximal high-grade arterial stenosis is identified.   MRA neck:   1. The aortic arch, common carotid artery origins, right innominate artery and very proximal subclavian arteries are excluded from the field of view. 2. The visible common carotid and internal carotid arteries are patent within the neck without hemodynamically significant stenosis. Mild atherosclerotic plaque about the carotid bifurcations and within the proximal internal carotid arteries, bilaterally. 3. Vertebral arteries patent within the neck with antegrade flow and without stenosis.  * MRI scan images were reviewed online. I agree with the written report.   2D echo 07/05/20:  IMPRESSIONS     1. Left ventricular ejection fraction, by estimation, is 50 to 55%. The  left ventricle has low normal function. The left ventricle has no regional  wall motion abnormalities. Left ventricular diastolic function could not  be evaluated.   2. Right ventricular systolic function is low normal. The right   ventricular size is normal.   3. Left atrial size was moderately dilated.   4. The mitral valve is grossly normal. Mild mitral valve regurgitation.  No evidence of mitral stenosis.   5. The aortic valve is tricuspid. Aortic valve regurgitation is trivial.  No aortic stenosis is present.     Assessment/Plan:  1.  Left posterior cerebral artery distribution stroke  2.  Atrial fibrillation  3.  Gait disorder  The patient has been left with a severe right visual field deficit, some mild right-sided weakness, and a change in memory.  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  The patient is getting physical therapy currently.  He is to remain on Xarelto, we will follow-up in about 3 to 4 months with this patient, he may  be followed by Dr. Leonie Man in the future.  Jill Alexanders MD 08/19/2020 2:59 PM  [facility] [address] [place], [place] [address]  Phone [phone] Fax [phone]

## 2020-12-30 ENCOUNTER — Ambulatory Visit: Payer: Medicare Other | Admitting: Neurology

## 2021-04-17 ENCOUNTER — Ambulatory Visit: Payer: Medicare Other | Admitting: Neurology

## 2021-06-16 ENCOUNTER — Ambulatory Visit: Payer: Medicare Other | Admitting: Neurology

## 2021-07-09 ENCOUNTER — Encounter: Payer: Self-pay | Admitting: *Deleted

## 2021-07-30 ENCOUNTER — Ambulatory Visit (INDEPENDENT_AMBULATORY_CARE_PROVIDER_SITE_OTHER): Payer: Medicare Other | Admitting: Urology

## 2021-07-30 ENCOUNTER — Encounter: Payer: Self-pay | Admitting: Urology

## 2021-07-30 ENCOUNTER — Telehealth: Payer: Self-pay

## 2021-07-30 VITALS — BP 168/75 | HR 56

## 2021-07-30 DIAGNOSIS — N281 Cyst of kidney, acquired: Secondary | ICD-10-CM | POA: Diagnosis not present

## 2021-07-30 NOTE — Telephone Encounter (Signed)
At checkout patients son and wife advised that they would call to set up follow up appt.

## 2021-07-30 NOTE — Patient Instructions (Signed)

## 2021-07-30 NOTE — Progress Notes (Signed)
07/30/2021 2:15 PM   Taylor Willis [birth year] [id number]  Referring provider: [name], MD [place],  [place] [address]  Renal Cysts   HPI: Mr Taylor Willis is a 81yo here for evaluation of renal cysts. He has a hx of ESRD on hemodialysis for 18 months. He underwent Spine MRI which showed bilateral cysts. He makes 3-4oz of urine daily. No hematuria. No other complaints today   PMH: Past Medical History:  Diagnosis Date   Arthritis    gout   Asthma    has rescue inhaler   Cancer ([place])    prostate   Chronic kidney disease    GERD (gastroesophageal reflux disease)    H/O peptic ulcer 1970's   Herpes 04/28/2020   Hyperlipidemia    Hypertension    Ureteral stone    Wears dentures    Wears glasses     Surgical History: Past Surgical History:  Procedure Laterality Date   AV FISTULA PLACEMENT Left 02/15/2019   Procedure: ARTERIOVENOUS (AV) FISTULA CREATION LEFT ARM;  Surgeon: Taylor Posner, MD;  [facility];  Service: Vascular;  Laterality: Left;   CATARACT EXTRACTION W/ INTRAOCULAR LENS  IMPLANT, BILATERAL     CYSTOSCOPY/RETROGRADE/URETEROSCOPY/STONE EXTRACTION WITH BASKET Right 05/23/2012   Procedure: CYSTOSCOPY/RETROGRADE/URETEROSCOPY/STONE EXTRACTION WITH BASKET;  Surgeon: Taylor Amass, MD;  Location: [facility];  Service: Urology;  Laterality: Right;   FRACTURE SURGERY Right    as a child   HERNIA REPAIR     HOLMIUM LASER APPLICATION Right 5/63/8756   Procedure: HOLMIUM LASER APPLICATION;  Surgeon: Taylor Amass, MD;  Location: [facility];  Service: Urology;  Laterality: Right;   INSERTION OF DIALYSIS CATHETER Right 04/08/2020   Procedure: INSERTION OF TUNNELED DIALYSIS CATHETER;  Surgeon: Taylor Cagey, MD;  Location: [facility];  Service: General;  Laterality: Right;   LIGATION OF ARTERIOVENOUS  FISTULA Left 09/12/2019   Procedure: SIDE BRANCH LIGATION  OF LEFT ARM ARTERIOVENOUS FISTULA;  Surgeon: Taylor Dutch,  MD;  Location: [place];  Service: Vascular;  Laterality: Left;   MULTIPLE TOOTH EXTRACTIONS     PERIPHERAL VASCULAR BALLOON ANGIOPLASTY  05/22/2020   Procedure: PERIPHERAL VASCULAR BALLOON ANGIOPLASTY;  Surgeon: Taylor Robins, MD;  Location: [facility];  Service: Cardiovascular;;  Left arm fistula   REVISON OF ARTERIOVENOUS FISTULA Left 09/12/2019   Procedure: REVISON OF LEFT ARM ARTERIOVENOUS FISTULA;  Surgeon: Taylor Dutch, MD;  Location: [place];  Service: Vascular;  Laterality: Left;   ROBOT ASSISTED LAPAROSCOPIC RADICAL PROSTATECTOMY  2011   TONSILLECTOMY      Home Medications:  Allergies as of 07/30/2021       Reactions   Aspirin Other (See Comments)   GI irritation   Hydrochlorothiazide    Dizziness and unable to walk   Thiazide-type Diuretics    Other reaction(s): Dizziness (intolerance) Dizziness and unable to walk Dizziness and unable to walk Dizziness and unable to walk Dizziness and unable to walk Dizziness and unable to walk Dizziness and unable to walk   Ibuprofen Other (See Comments)   GI irritation        Medication List        Accurate as of July 30, 2021  2:15 PM. If you have any questions, ask your nurse or doctor.          acetaminophen 500 MG tablet Commonly known as: TYLENOL Take 500 mg by mouth every 6 (six) hours as  needed for moderate pain.   albuterol 108 (90 Base) MCG/ACT inhaler Commonly known as: VENTOLIN HFA Inhale 2 puffs into the lungs every 6 (six) hours as needed for wheezing.   allopurinol 100 MG tablet Commonly known as: ZYLOPRIM Take 100 mg by mouth daily as needed (gout).   amLODipine 2.5 MG tablet Commonly known as: NORVASC Take 2.5 mg by mouth daily.   apixaban 5 MG Tabs tablet Commonly known as: ELIQUIS Take 1 tablet (5 mg total) by mouth 2 (two) times daily.   calcitRIOL 0.25 MCG capsule Commonly known as: ROCALTROL Take 0.25 mcg by mouth 3 (three) times a week. MWF   calcium acetate 667 MG  capsule Commonly known as: PHOSLO Take 667 mg by mouth 2 (two) times daily with a meal.   carvedilol 3.125 MG tablet Commonly known as: COREG Take 1 tablet (3.125 mg total) by mouth See admin instructions. Take 1 tablet in the evenings on Dialysis days (Tues, Thurs, and Sat), On Non-dialysis days take 1 tablet twice a day.   Cholecalciferol 25 MCG (1000 UT) tablet Take 1,000 Units by mouth daily.   furosemide 40 MG tablet Commonly known as: LASIX Take 120 mg by mouth daily.   multivitamin Tabs tablet Take 1 tablet by mouth daily.   sertraline 25 MG tablet Commonly known as: ZOLOFT Take 25 mg by mouth daily.   vitamin B-12 1000 MCG tablet Commonly known as: CYANOCOBALAMIN Take 1,000 mcg by mouth daily.        Allergies:  Allergies  Allergen Reactions   Aspirin Other (See Comments)    GI irritation    Hydrochlorothiazide     Dizziness and unable to walk   Thiazide-Type Diuretics     Other reaction(s): Dizziness (intolerance) Dizziness and unable to walk Dizziness and unable to walk Dizziness and unable to walk Dizziness and unable to walk Dizziness and unable to walk Dizziness and unable to walk    Ibuprofen Other (See Comments)    GI irritation     Family History: No family history on file.  Social History:  reports that he quit smoking about 39 years ago. His smoking use included cigarettes. He has never used smokeless tobacco. He reports that he does not currently use alcohol after a past usage of about 1.0 standard drink of alcohol per week. He reports that he does not use drugs.  ROS: All other review of systems were reviewed and are negative except what is noted above in HPI  Physical Exam: BP (!) 168/75   Pulse (!) 56   Constitutional:  Alert and oriented, No acute distress. HEENT: [place] AT, moist mucus membranes.  Trachea midline, no masses. Cardiovascular: No clubbing, cyanosis, or edema. Respiratory: Normal respiratory effort, no increased work  of breathing. GI: Abdomen is soft, nontender, nondistended, no abdominal masses GU: No CVA tenderness.  Lymph: No cervical or inguinal lymphadenopathy. Skin: No rashes, bruises or suspicious lesions. Neurologic: Grossly intact, no focal deficits, moving all 4 extremities. Psychiatric: Normal mood and affect.  Laboratory Data: Lab Results  Component Value Date   WBC 6.6 07/06/2020   HGB 10.2 (L) 07/06/2020   HCT 33.8 (L) 07/06/2020   MCV 96.6 07/06/2020   PLT 135 (L) 07/06/2020    Lab Results  Component Value Date   CREATININE 3.76 (H) 07/07/2020    No results found for: "PSA"  No results found for: "TESTOSTERONE"  Lab Results  Component Value Date   HGBA1C 5.4 07/06/2020    Urinalysis  Component Value Date/Time   COLORURINE YELLOW 04/10/2020 1116   APPEARANCEUR HAZY (A) 04/10/2020 1116   LABSPEC 1.011 04/10/2020 1116   PHURINE 5.0 04/10/2020 1116   GLUCOSEU NEGATIVE 04/10/2020 1116   HGBUR MODERATE (A) 04/10/2020 1116   BILIRUBINUR NEGATIVE 04/10/2020 1116   [place] 04/10/2020 1116   PROTEINUR 100 (A) 04/10/2020 1116   NITRITE NEGATIVE 04/10/2020 1116   LEUKOCYTESUR NEGATIVE 04/10/2020 1116    Lab Results  Component Value Date   BACTERIA FEW (A) 04/10/2020    Pertinent Imaging:  No results found for this or any previous visit.  No results found for this or any previous visit.  No results found for this or any previous visit.  No results found for this or any previous visit.  No results found for this or any previous visit.  No results found for this or any previous visit.  No results found for this or any previous visit.  No results found for this or any previous visit.   Assessment & Plan:    1. Renal cyst -Renal US. RTC 4-6 weeks - Urinalysis, Routine w reflex microscopic   No follow-ups on file.  Nicolette Bang, MD  [facility] [place]

## 2021-08-06 ENCOUNTER — Other Ambulatory Visit (HOSPITAL_COMMUNITY): Payer: Medicare Other

## 2021-08-15 ENCOUNTER — Ambulatory Visit (HOSPITAL_COMMUNITY)
Admission: RE | Admit: 2021-08-15 | Discharge: 2021-08-15 | Disposition: A | Discharge status: Home / Self Care | Payer: Medicare Other | Source: Ambulatory Visit | Attending: Urology | Admitting: Urology

## 2021-08-15 DIAGNOSIS — N281 Cyst of kidney, acquired: Secondary | ICD-10-CM | POA: Diagnosis present

## 2021-10-11 ENCOUNTER — Emergency Department (HOSPITAL_COMMUNITY)
Admission: EM | Admit: 2021-10-11 | Discharge: 2021-10-11 | Disposition: A | Discharge status: Home / Self Care | Payer: Medicare Other | Attending: Emergency Medicine | Admitting: Emergency Medicine

## 2021-10-11 ENCOUNTER — Other Ambulatory Visit: Payer: Self-pay

## 2021-10-11 ENCOUNTER — Encounter (HOSPITAL_COMMUNITY): Payer: Self-pay | Admitting: *Deleted

## 2021-10-11 DIAGNOSIS — M25522 Pain in left elbow: Secondary | ICD-10-CM

## 2021-10-11 DIAGNOSIS — Z79899 Other long term (current) drug therapy: Secondary | ICD-10-CM | POA: Insufficient documentation

## 2021-10-11 DIAGNOSIS — M79622 Pain in left upper arm: Secondary | ICD-10-CM | POA: Insufficient documentation

## 2021-10-11 DIAGNOSIS — Z7901 Long term (current) use of anticoagulants: Secondary | ICD-10-CM | POA: Insufficient documentation

## 2021-10-11 NOTE — Discharge Instructions (Signed)
Keep bandage on arm until tomorrow.  Follow-up as needed

## 2021-10-11 NOTE — ED Triage Notes (Signed)
Pt brought in by [facility] from [facility] with c/o not being able to get his fistula in left upper arm to stop bleeding after dialysis today. Dialysis ended around 1515 today. C-clamp on pt's left upper arm preventing any further bleeding at this time.

## 2021-10-11 NOTE — ED Provider Notes (Signed)
[facility] EMERGENCY DEPARTMENT Provider Note   CSN: [id number] Arrival date & time: 10/11/21  1701     History {Add pertinent medical, surgical, social history, OB history to HPI:1} Chief Complaint  Patient presents with   Vascular Access Problem    Taylor Willis is a 81 y.o. male.  Patient has a history of renal failure and is bleeding from his dialysis catheter and he was sent over here.   Arm Injury      Home Medications Prior to Admission medications   Medication Sig Start Date End Date Taking? Authorizing Provider  acetaminophen (TYLENOL) 500 MG tablet Take 500 mg by mouth every 6 (six) hours as needed for moderate pain.    [provider]  albuterol (PROVENTIL HFA;VENTOLIN HFA) 108 (90 BASE) MCG/ACT inhaler Inhale 2 puffs into the lungs every 6 (six) hours as needed for wheezing.    [provider]  allopurinol (ZYLOPRIM) 100 MG tablet Take 100 mg by mouth daily as needed (gout). 12/04/19   [provider]  amLODipine (NORVASC) 2.5 MG tablet Take 2.5 mg by mouth daily. 05/15/21   [provider]  apixaban (ELIQUIS) 5 MG TABS tablet Take 1 tablet (5 mg total) by mouth 2 (two) times daily. 07/07/20   Armando Reichert, MD  calcitRIOL (ROCALTROL) 0.25 MCG capsule Take 0.25 mcg by mouth 3 (three) times a week. MWF 03/21/20   [provider]  calcium acetate (PHOSLO) 667 MG capsule Take 667 mg by mouth 2 (two) times daily with a meal.    [provider]  carvedilol (COREG) 3.125 MG tablet Take 1 tablet (3.125 mg total) by mouth See admin instructions. Take 1 tablet in the evenings on Dialysis days (Tues, Thurs, and Sat), On Non-dialysis days take 1 tablet twice a day. 04/29/20   Roxan Hockey, MD  Cholecalciferol 25 MCG (1000 UT) tablet Take 1,000 Units by mouth daily.  Patient not taking: Reported on 07/30/2021    [provider]  furosemide (LASIX) 40 MG tablet Take 120 mg by mouth daily. 07/17/21   [provider]  multivitamin (RENA-VIT) TABS tablet Take 1 tablet by mouth daily. 07/25/21   [provider]  sertraline (ZOLOFT) 25 MG tablet Take 25 mg by mouth daily. 07/29/21   [provider]  vitamin B-12 (CYANOCOBALAMIN) 1000 MCG tablet Take 1,000 mcg by mouth daily. Patient not taking: Reported on 07/30/2021    [provider]      Allergies    Aspirin, Hydrochlorothiazide, Thiazide-type diuretics, and Ibuprofen    Review of Systems   Review of Systems  Physical Exam Updated Vital Signs BP 137/73   Pulse (!) 54   Temp (!) 97.5 F (36.4 C) (Oral)   Resp 13   Ht 6' (1.829 m)   Wt 72.6 kg   SpO2 99%   BMI 21.70 kg/m  Physical Exam  ED Results / Procedures / Treatments   Labs (all labs ordered are listed, but only abnormal results are displayed) Labs Reviewed - No data to display  EKG None  Radiology No results found.  Procedures Procedures  {Document cardiac monitor, telemetry assessment procedure when appropriate:1}  Medications Ordered in ED Medications - No data to display  ED Course/ Medical Decision Making/ A&P  Patient had no bleeding when he got here we observed him for an hour and he continued to have no bleeding.  Medical Decision Making Bleeding from dialysis catheter that has improved.  He will follow-up as needed  {Document critical care time when appropriate:1} {Document review of labs and clinical decision tools ie heart score, Chads2Vasc2 etc:1}  {Document your independent review of radiology images, and any outside records:1} {Document your discussion with family members, caretakers, and with consultants:1} {Document social determinants of health affecting pt's care:1} {Document your decision making why or why not admission, treatments were needed:1} Final Clinical Impression(s) / ED Diagnoses Final diagnoses:  Arthralgia of left upper arm    Rx / DC Orders ED Discharge Orders     None

## 2021-10-23 ENCOUNTER — Other Ambulatory Visit: Payer: Self-pay

## 2021-10-23 ENCOUNTER — Emergency Department (HOSPITAL_COMMUNITY)
Admission: EM | Admit: 2021-10-23 | Discharge: 2021-10-23 | Disposition: A | Discharge status: Home / Self Care | Payer: Medicare Other | Attending: Emergency Medicine | Admitting: Emergency Medicine

## 2021-10-23 ENCOUNTER — Encounter (HOSPITAL_COMMUNITY): Payer: Self-pay | Admitting: *Deleted

## 2021-10-23 DIAGNOSIS — T82590A Other mechanical complication of surgically created arteriovenous fistula, initial encounter: Secondary | ICD-10-CM | POA: Insufficient documentation

## 2021-10-23 DIAGNOSIS — Z8546 Personal history of malignant neoplasm of prostate: Secondary | ICD-10-CM | POA: Diagnosis not present

## 2021-10-23 DIAGNOSIS — Z992 Dependence on renal dialysis: Secondary | ICD-10-CM | POA: Diagnosis not present

## 2021-10-23 DIAGNOSIS — Z7901 Long term (current) use of anticoagulants: Secondary | ICD-10-CM | POA: Diagnosis not present

## 2021-10-23 DIAGNOSIS — N186 End stage renal disease: Secondary | ICD-10-CM | POA: Insufficient documentation

## 2021-10-23 DIAGNOSIS — I120 Hypertensive chronic kidney disease with stage 5 chronic kidney disease or end stage renal disease: Secondary | ICD-10-CM | POA: Insufficient documentation

## 2021-10-23 DIAGNOSIS — Z7951 Long term (current) use of inhaled steroids: Secondary | ICD-10-CM | POA: Insufficient documentation

## 2021-10-23 DIAGNOSIS — J45909 Unspecified asthma, uncomplicated: Secondary | ICD-10-CM | POA: Insufficient documentation

## 2021-10-23 DIAGNOSIS — Z79899 Other long term (current) drug therapy: Secondary | ICD-10-CM | POA: Diagnosis not present

## 2021-10-23 DIAGNOSIS — T829XXA Unspecified complication of cardiac and vascular prosthetic device, implant and graft, initial encounter: Secondary | ICD-10-CM

## 2021-10-23 DIAGNOSIS — I4891 Unspecified atrial fibrillation: Secondary | ICD-10-CM | POA: Diagnosis not present

## 2021-10-23 LAB — CBC WITH DIFFERENTIAL/PLATELET
Abs Immature Granulocytes: 0.01 10*3/uL (ref 0.00–0.07)
Basophils Absolute: 0 10*3/uL (ref 0.0–0.1)
Basophils Relative: 1 %
Eosinophils Absolute: 0.1 10*3/uL (ref 0.0–0.5)
Eosinophils Relative: 2 %
HCT: 39.5 % (ref 39.0–52.0)
Hemoglobin: 12.7 g/dL — ABNORMAL LOW (ref 13.0–17.0)
Immature Granulocytes: 0 %
Lymphocytes Relative: 33 %
Lymphs Abs: 1.5 10*3/uL (ref 0.7–4.0)
MCH: 32.8 pg (ref 26.0–34.0)
MCHC: 32.2 g/dL (ref 30.0–36.0)
MCV: 102.1 fL — ABNORMAL HIGH (ref 80.0–100.0)
Monocytes Absolute: 0.5 10*3/uL (ref 0.1–1.0)
Monocytes Relative: 11 %
Neutro Abs: 2.4 10*3/uL (ref 1.7–7.7)
Neutrophils Relative %: 53 %
Platelets: 123 10*3/uL — ABNORMAL LOW (ref 150–400)
RBC: 3.87 MIL/uL — ABNORMAL LOW (ref 4.22–5.81)
RDW: 15.8 % — ABNORMAL HIGH (ref 11.5–15.5)
WBC: 4.6 10*3/uL (ref 4.0–10.5)
nRBC: 0 % (ref 0.0–0.2)

## 2021-10-23 LAB — PROTIME-INR
INR: 1.2 (ref 0.8–1.2)
Prothrombin Time: 14.8 seconds (ref 11.4–15.2)

## 2021-10-23 NOTE — ED Triage Notes (Signed)
Pt brought in by [facility] for c/o left arm bleeding; pt is from [facility] in [place]; pt has bleeding to both his dialysis ports  Pt alert and oriented

## 2021-10-23 NOTE — ED Provider Notes (Signed)
[facility] EMERGENCY DEPARTMENT Provider Note   CSN: [id number] Arrival date & time: 10/23/21  1636     History  Chief Complaint  Patient presents with   Coagulation Disorder    Taylor Willis is a 81 y.o. male.  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  Pt had issues with his AV fistula bleeding on 9/9 and was seen in the ED.  However, bleeding had stopped and no intervention needed.  Pt had bleeding again today after the dialysis needles were removed.  He was sent to the ED when dialysis could not get it stopped.       Home Medications Prior to Admission medications   Medication Sig Start Date End Date Taking? Authorizing Provider  acetaminophen (TYLENOL) 500 MG tablet Take 500 mg by mouth every 6 (six) hours as needed for moderate pain.    [provider]  albuterol (PROVENTIL HFA;VENTOLIN HFA) 108 (90 BASE) MCG/ACT inhaler Inhale 2 puffs into the lungs every 6 (six) hours as needed for wheezing.    [provider]  allopurinol (ZYLOPRIM) 100 MG tablet Take 100 mg by mouth daily as needed (gout). 12/04/19   [provider]  amLODipine (NORVASC) 2.5 MG tablet Take 2.5 mg by mouth daily. 05/15/21   [provider]  apixaban (ELIQUIS) 5 MG TABS tablet Take 1 tablet (5 mg total) by mouth 2 (two) times daily. 07/07/20   Armando Reichert, MD  calcitRIOL (ROCALTROL) 0.25 MCG capsule Take 0.25 mcg by mouth 3 (three) times a week. MWF 03/21/20   [provider]  calcium acetate (PHOSLO) 667 MG capsule Take 667 mg by mouth 2 (two) times daily with a meal.    [provider]  carvedilol (COREG) 3.125 MG tablet Take 1 tablet (3.125 mg total) by mouth See admin instructions. Take 1 tablet in the evenings on Dialysis days (Tues, Thurs, and Sat), On Non-dialysis days take 1 tablet twice a day. 04/29/20   Roxan Hockey, MD  Cholecalciferol 25  MCG (1000 UT) tablet Take 1,000 Units by mouth daily.  Patient not taking: Reported on 07/30/2021    [provider]  furosemide (LASIX) 40 MG tablet Take 120 mg by mouth daily. 07/17/21   [provider]  multivitamin (RENA-VIT) TABS tablet Take 1 tablet by mouth daily. 07/25/21   [provider]  sertraline (ZOLOFT) 25 MG tablet Take 25 mg by mouth daily. 07/29/21   [provider]  vitamin B-12 (CYANOCOBALAMIN) 1000 MCG tablet Take 1,000 mcg by mouth daily. Patient not taking: Reported on 07/30/2021    [provider]      Allergies    Aspirin, Hydrochlorothiazide, Thiazide-type diuretics, and Ibuprofen    Review of Systems   Review of Systems  Skin:        Bleeding from AVF  All other systems reviewed and are negative.   Physical Exam Updated Vital Signs BP (!) 156/80   Pulse (!) 58   Temp 98 F (36.7 C)   Resp 19   Ht 6' (1.829 m)   Wt 72.5 kg   SpO2 100%   BMI 21.68 kg/m  Physical Exam Vitals and nursing note reviewed.  Constitutional:      Appearance: Normal appearance.  HENT:     Head: Normocephalic and atraumatic.     Right Ear: External ear normal.     Left  Ear: External ear normal.     Nose: Nose normal.     Mouth/Throat:     Mouth: Mucous membranes are moist.     Pharynx: Oropharynx is clear.  Eyes:     Extraocular Movements: Extraocular movements intact.     Conjunctiva/sclera: Conjunctivae normal.     Pupils: Pupils are equal, round, and reactive to light.  Cardiovascular:     Rate and Rhythm: Normal rate. Rhythm irregular.     Pulses: Normal pulses.     Heart sounds: Normal heart sounds.  Pulmonary:     Effort: Pulmonary effort is normal.     Breath sounds: Normal breath sounds.  Abdominal:     General: Abdomen is flat. Bowel sounds are normal.     Palpations: Abdomen is soft.  Musculoskeletal:        General: Normal range of motion.     Cervical back: Normal range of motion and neck supple.      Comments: LUE with 2 small puncture wounds which are both spurting blood.  Skin:    General: Skin is warm.     Capillary Refill: Capillary refill takes less than 2 seconds.  Neurological:     General: No focal deficit present.     Mental Status: He is alert and oriented to person, place, and time.  Psychiatric:        Mood and Affect: Mood normal.        Behavior: Behavior normal.     ED Results / Procedures / Treatments   Labs (all labs ordered are listed, but only abnormal results are displayed) Labs Reviewed  CBC WITH DIFFERENTIAL/PLATELET - Abnormal; Notable for the following components:      Result Value   RBC 3.87 (*)    Hemoglobin 12.7 (*)    MCV 102.1 (*)    RDW 15.8 (*)    Platelets 123 (*)    All other components within normal limits  PROTIME-INR    EKG None  Radiology No results found.  Procedures Procedures    Medications Ordered in ED Medications - No data to display  ED Course/ Medical Decision Making/ A&P                           Medical Decision Making Amount and/or Complexity of Data Reviewed Labs: ordered.   This patient presents to the ED for concern of bleeding, this involves an extensive number of treatment options, and is a complaint that carries with it a high risk of complications and morbidity.  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  The pertinent results include:  cbc with hgb 12.7 (hgb 10.2 1 year ago); inr 1.2   Critical Interventions:  Stopping bleeding   Problem List / ED Course:  Arterial bleeding:  pressure  held for several minutes over the bleeding sites.  The bleeding slowed enough to try quick clot with clamps.  Eventually surgicell was applied with clamps which has stopped the bleeding.  Clamps removed.  Pt observed and ambulated with walker.  No further bleeding.  Pt is stable for  d/c.  Pt is to hold Eliquis tonight and tomorrow.   Reevaluation:  After the interventions noted above, I reevaluated the patient and found that they have :improved   Social Determinants of Health:  Lives at home with wife   Dispostion:  After consideration of the diagnostic results and the patients response to treatment, I feel that the patent would benefit from discharge with outpatient f/u.          Final Clinical Impression(s) / ED Diagnoses Final diagnoses:  Complication of arteriovenous dialysis fistula, initial encounter  On apixaban therapy    Rx / DC Orders ED Discharge Orders     None         Isla Pence, MD 10/23/21 2010

## 2021-10-23 NOTE — Discharge Instructions (Addendum)
Hold Eliquis tonight and tomorrow morning.    If it starts to bleed:  hold direct pressure for 10 minutes straight.

## 2021-12-24 ENCOUNTER — Encounter (HOSPITAL_COMMUNITY): Payer: Self-pay | Admitting: Emergency Medicine

## 2021-12-24 ENCOUNTER — Other Ambulatory Visit: Payer: Self-pay

## 2021-12-24 ENCOUNTER — Emergency Department (HOSPITAL_COMMUNITY)
Admission: EM | Admit: 2021-12-24 | Discharge: 2021-12-24 | Disposition: A | Discharge status: Home / Self Care | Payer: Medicare Other | Attending: Emergency Medicine | Admitting: Emergency Medicine

## 2021-12-24 DIAGNOSIS — Z7901 Long term (current) use of anticoagulants: Secondary | ICD-10-CM | POA: Diagnosis not present

## 2021-12-24 DIAGNOSIS — Z992 Dependence on renal dialysis: Secondary | ICD-10-CM | POA: Diagnosis not present

## 2021-12-24 DIAGNOSIS — N186 End stage renal disease: Secondary | ICD-10-CM | POA: Diagnosis not present

## 2021-12-24 DIAGNOSIS — Z452 Encounter for adjustment and management of vascular access device: Secondary | ICD-10-CM | POA: Insufficient documentation

## 2021-12-24 DIAGNOSIS — Z789 Other specified health status: Secondary | ICD-10-CM

## 2021-12-24 NOTE — ED Triage Notes (Addendum)
Pt BIB [facility] from [place] for bleeding from left arm dialysis port, bleeding controlled at this time, dialysis treatment today was completed   V/s en route: HR65, 130/54, 99% RA, RR 16

## 2021-12-24 NOTE — ED Notes (Signed)
Pts family refused to wait for discharge paperwork.

## 2021-12-24 NOTE — ED Provider Notes (Signed)
[facility] EMERGENCY DEPARTMENT Provider Note   CSN: [id number] Arrival date & time: 12/24/21  1810     History  Chief Complaint  Patient presents with   Vascular Access Problem    Taylor Willis is a 81 y.o. male.  HPI   This patient is an 81 year old male, he has a history of end-stage renal disease and he takes Eliquis, he was having his dialysis session today and his left upper extremity in the fistula, when dialysis ended the bleeding would not stop at the access site, a clamp was placed and he was sent to the ER.  He has no other symptoms at this time.  Home Medications Prior to Admission medications   Medication Sig Start Date End Date Taking? Authorizing Provider  acetaminophen (TYLENOL) 500 MG tablet Take 500 mg by mouth every 6 (six) hours as needed for moderate pain.    [provider]  albuterol (PROVENTIL HFA;VENTOLIN HFA) 108 (90 BASE) MCG/ACT inhaler Inhale 2 puffs into the lungs every 6 (six) hours as needed for wheezing.    [provider]  allopurinol (ZYLOPRIM) 100 MG tablet Take 100 mg by mouth daily as needed (gout). 12/04/19   [provider]  amLODipine (NORVASC) 2.5 MG tablet Take 2.5 mg by mouth daily. 05/15/21   [provider]  apixaban (ELIQUIS) 5 MG TABS tablet Take 1 tablet (5 mg total) by mouth 2 (two) times daily. 07/07/20   Armando Reichert, MD  calcitRIOL (ROCALTROL) 0.25 MCG capsule Take 0.25 mcg by mouth 3 (three) times a week. MWF 03/21/20   [provider]  calcium acetate (PHOSLO) 667 MG capsule Take 667 mg by mouth 2 (two) times daily with a meal.    [provider]  carvedilol (COREG) 3.125 MG tablet Take 1 tablet (3.125 mg total) by mouth See admin instructions. Take 1 tablet in the evenings on Dialysis days (Tues, Thurs, and Sat), On Non-dialysis days take 1 tablet twice a day. 04/29/20   Roxan Hockey, MD  Cholecalciferol 25 MCG (1000 UT) tablet Take 1,000 Units by mouth daily.  Patient not  taking: Reported on 07/30/2021    [provider]  furosemide (LASIX) 40 MG tablet Take 120 mg by mouth daily. 07/17/21   [provider]  multivitamin (RENA-VIT) TABS tablet Take 1 tablet by mouth daily. 07/25/21   [provider]  sertraline (ZOLOFT) 25 MG tablet Take 25 mg by mouth daily. 07/29/21   [provider]  vitamin B-12 (CYANOCOBALAMIN) 1000 MCG tablet Take 1,000 mcg by mouth daily. Patient not taking: Reported on 07/30/2021    [provider]      Allergies    Aspirin, Hydrochlorothiazide, Thiazide-type diuretics, and Ibuprofen    Review of Systems   Review of Systems  All other systems reviewed and are negative.   Physical Exam Updated Vital Signs BP 119/63   Pulse 81   Temp (!) 97.4 F (36.3 C) (Oral)   Resp 17   Ht 1.829 m (6')   Wt 72.1 kg   SpO2 98%   BMI 21.56 kg/m  Physical Exam Vitals and nursing note reviewed.  Constitutional:      General: He is not in acute distress.    Appearance: He is well-developed.  HENT:     Head: Normocephalic and atraumatic.     Mouth/Throat:     Pharynx: No oropharyngeal exudate.  Eyes:     General: No scleral icterus.       Right eye:  No discharge.        Left eye: No discharge.     Conjunctiva/sclera: Conjunctivae normal.     Pupils: Pupils are equal, round, and reactive to light.  Neck:     Thyroid: No thyromegaly.     Vascular: No JVD.  Cardiovascular:     Rate and Rhythm: Normal rate and regular rhythm.     Heart sounds: Normal heart sounds. No murmur heard.    No friction rub. No gallop.  Pulmonary:     Effort: Pulmonary effort is normal. No respiratory distress.     Breath sounds: Normal breath sounds. No wheezing or rales.  Abdominal:     General: Bowel sounds are normal. There is no distension.     Palpations: Abdomen is soft. There is no mass.     Tenderness: There is no abdominal tenderness.  Musculoskeletal:        General: No tenderness. Normal range of  motion.     Cervical back: Normal range of motion and neck supple.     Right lower leg: No edema.     Left lower leg: No edema.     Comments: Fistula site seems to be under control with clamp in place  Lymphadenopathy:     Cervical: No cervical adenopathy.  Skin:    General: Skin is warm and dry.     Findings: No erythema or rash.  Neurological:     Mental Status: He is alert.     Coordination: Coordination normal.  Psychiatric:        Behavior: Behavior normal.     ED Results / Procedures / Treatments   Labs (all labs ordered are listed, but only abnormal results are displayed) Labs Reviewed - No data to display  EKG None  Radiology No results found.  Procedures Procedures    Medications Ordered in ED Medications - No data to display  ED Course/ Medical Decision Making/ A&P                           Medical Decision Making  This patient is in no distress, will takedown clamp shortly to make sure bleeding has completely stopped.  He is otherwise hemodynamically stable and well-appearing  After short observation the patient was reevaluated, no ongoing bleeding, dressing placed, compression removed, vital signs unremarkable, patient agreeable to return should symptoms worsen        Final Clinical Impression(s) / ED Diagnoses Final diagnoses:  Problem with vascular access    Rx / DC Orders ED Discharge Orders     None         Noemi Chapel, MD 12/24/21 2144

## 2021-12-24 NOTE — Discharge Instructions (Signed)
The bleeding has stopped.  If it starts bleeding again hold pressure and come back to the hospital immediately.  Call 911 if this occurs.

## 2022-04-21 ENCOUNTER — Emergency Department (HOSPITAL_COMMUNITY)
Admission: EM | Admit: 2022-04-21 | Discharge: 2022-04-21 | Disposition: A | Discharge status: Home / Self Care | Payer: Medicare Other | Attending: Emergency Medicine | Admitting: Emergency Medicine

## 2022-04-21 ENCOUNTER — Other Ambulatory Visit: Payer: Self-pay

## 2022-04-21 ENCOUNTER — Encounter (HOSPITAL_COMMUNITY): Payer: Self-pay

## 2022-04-21 DIAGNOSIS — Z7901 Long term (current) use of anticoagulants: Secondary | ICD-10-CM | POA: Diagnosis not present

## 2022-04-21 DIAGNOSIS — Y733 Surgical instruments, materials and gastroenterology and urology devices (including sutures) associated with adverse incidents: Secondary | ICD-10-CM | POA: Insufficient documentation

## 2022-04-21 DIAGNOSIS — Z992 Dependence on renal dialysis: Secondary | ICD-10-CM | POA: Diagnosis not present

## 2022-04-21 DIAGNOSIS — Z789 Other specified health status: Secondary | ICD-10-CM

## 2022-04-21 DIAGNOSIS — T82838A Hemorrhage of vascular prosthetic devices, implants and grafts, initial encounter: Secondary | ICD-10-CM | POA: Insufficient documentation

## 2022-04-21 LAB — COMPREHENSIVE METABOLIC PANEL
ALT: 17 U/L (ref 0–44)
AST: 30 U/L (ref 15–41)
Albumin: 3.8 g/dL (ref 3.5–5.0)
Alkaline Phosphatase: 78 U/L (ref 38–126)
Anion gap: 13 (ref 5–15)
BUN: 35 mg/dL — ABNORMAL HIGH (ref 8–23)
CO2: 22 mmol/L (ref 22–32)
Calcium: 8.5 mg/dL — ABNORMAL LOW (ref 8.9–10.3)
Chloride: 98 mmol/L (ref 98–111)
Creatinine, Ser: 5.11 mg/dL — ABNORMAL HIGH (ref 0.61–1.24)
GFR, Estimated: 11 mL/min — ABNORMAL LOW (ref >60)
Glucose, Bld: 185 mg/dL — ABNORMAL HIGH (ref 70–99)
Potassium: 4 mmol/L (ref 3.5–5.1)
Sodium: 133 mmol/L — ABNORMAL LOW (ref 135–145)
Total Bilirubin: 0.5 mg/dL (ref 0.3–1.2)
Total Protein: 7.5 g/dL (ref 6.5–8.1)

## 2022-04-21 LAB — CBC WITH DIFFERENTIAL/PLATELET
Abs Immature Granulocytes: 0.02 10*3/uL (ref 0.00–0.07)
Basophils Absolute: 0 10*3/uL (ref 0.0–0.1)
Basophils Relative: 1 %
Eosinophils Absolute: 0.1 10*3/uL (ref 0.0–0.5)
Eosinophils Relative: 2 %
HCT: 33.4 % — ABNORMAL LOW (ref 39.0–52.0)
Hemoglobin: 10.8 g/dL — ABNORMAL LOW (ref 13.0–17.0)
Immature Granulocytes: 0 %
Lymphocytes Relative: 15 %
Lymphs Abs: 0.9 10*3/uL (ref 0.7–4.0)
MCH: 33.8 pg (ref 26.0–34.0)
MCHC: 32.3 g/dL (ref 30.0–36.0)
MCV: 104.4 fL — ABNORMAL HIGH (ref 80.0–100.0)
Monocytes Absolute: 0.6 10*3/uL (ref 0.1–1.0)
Monocytes Relative: 10 %
Neutro Abs: 4.3 10*3/uL (ref 1.7–7.7)
Neutrophils Relative %: 72 %
Platelets: 108 10*3/uL — ABNORMAL LOW (ref 150–400)
RBC: 3.2 MIL/uL — ABNORMAL LOW (ref 4.22–5.81)
RDW: 14.8 % (ref 11.5–15.5)
WBC: 5.9 10*3/uL (ref 4.0–10.5)
nRBC: 0 % (ref 0.0–0.2)

## 2022-04-21 LAB — PROTIME-INR
INR: 1.2 (ref 0.8–1.2)
Prothrombin Time: 15.1 seconds (ref 11.4–15.2)

## 2022-04-21 MED ORDER — LIDOCAINE-EPINEPHRINE (PF) 2 %-1:200000 IJ SOLN
10.0000 mL | Freq: Once | INTRAMUSCULAR | Status: DC
  Filled 2022-04-21: qty 20

## 2022-04-21 NOTE — ED Notes (Signed)
Removed dsg to L fistula to check for bleeding. No bleeding noted. Light pressure dsg applied. Dr Doren Custard notified.

## 2022-04-21 NOTE — ED Notes (Signed)
Suture cart and lidocaine at bedside.  

## 2022-04-21 NOTE — ED Provider Notes (Signed)
[facility] Provider Note   CSN: [id number] Arrival date & time: 04/21/22  1727     History  Chief Complaint  Patient presents with   Vascular Access Problem    Taylor Willis is a 82 y.o. male.  HPI Patient presents for bleeding fistula.  This occurred following completion of his dialysis session today at 330.  He is on Eliquis and does state that he took his dose this morning.  Following his HD session, patient had persistent bleeding from his fistula.  After 1 hour of persistent bleeding, EMS was called to dialysis center.  When EMS checked fistula just prior to being bedded in the ED, he continued to have pulsatile bleeding.  Clamp and gauze were replaced.  Patient denies any current physical symptoms.  Per chart review, this has been a recurrent issue for him.  Typically, this resolves without further intervention.    Home Medications Prior to Admission medications   Medication Sig Start Date End Date Taking? Authorizing Provider  acetaminophen (TYLENOL) 500 MG tablet Take 500 mg by mouth every 6 (six) hours as needed for moderate pain.    [provider]  albuterol (PROVENTIL HFA;VENTOLIN HFA) 108 (90 BASE) MCG/ACT inhaler Inhale 2 puffs into the lungs every 6 (six) hours as needed for wheezing.    [provider]  allopurinol (ZYLOPRIM) 100 MG tablet Take 100 mg by mouth daily as needed (gout). 12/04/19   [provider]  amLODipine (NORVASC) 2.5 MG tablet Take 2.5 mg by mouth daily. 05/15/21   [provider]  apixaban (ELIQUIS) 5 MG TABS tablet Take 1 tablet (5 mg total) by mouth 2 (two) times daily. 07/07/20   Armando Reichert, MD  calcitRIOL (ROCALTROL) 0.25 MCG capsule Take 0.25 mcg by mouth 3 (three) times a week. MWF 03/21/20   [provider]  calcium acetate (PHOSLO) 667 MG capsule Take 667 mg by mouth 2 (two) times daily with a meal.    [provider]  carvedilol (COREG) 3.125 MG  tablet Take 1 tablet (3.125 mg total) by mouth See admin instructions. Take 1 tablet in the evenings on Dialysis days (Tues, Thurs, and Sat), On Non-dialysis days take 1 tablet twice a day. 04/29/20   Roxan Hockey, MD  Cholecalciferol 25 MCG (1000 UT) tablet Take 1,000 Units by mouth daily.  Patient not taking: Reported on 07/30/2021    [provider]  furosemide (LASIX) 40 MG tablet Take 120 mg by mouth daily. 07/17/21   [provider]  multivitamin (RENA-VIT) TABS tablet Take 1 tablet by mouth daily. 07/25/21   [provider]  sertraline (ZOLOFT) 25 MG tablet Take 25 mg by mouth daily. 07/29/21   [provider]  vitamin B-12 (CYANOCOBALAMIN) 1000 MCG tablet Take 1,000 mcg by mouth daily. Patient not taking: Reported on 07/30/2021    [provider]      Allergies    Aspirin, Hydrochlorothiazide, Thiazide-type diuretics, and Ibuprofen    Review of Systems   Review of Systems  Skin:  Positive for wound.  All other systems reviewed and are negative.   Physical Exam Updated Vital Signs BP (!) 162/73   Pulse 90   Temp 98.2 F (36.8 C) (Oral)   Resp 18   Ht 5\' 10"  (1.778 m)   Wt 70.8 kg   SpO2 95%   BMI 22.38 kg/m  Physical Exam Vitals and nursing note reviewed.  Constitutional:      General:  He is not in acute distress.    Appearance: Normal appearance. He is well-developed. He is not toxic-appearing or diaphoretic.  HENT:     Head: Normocephalic and atraumatic.     Right Ear: External ear normal.     Left Ear: External ear normal.     Nose: Nose normal.     Mouth/Throat:     Mouth: Mucous membranes are moist.  Eyes:     Extraocular Movements: Extraocular movements intact.     Conjunctiva/sclera: Conjunctivae normal.  Cardiovascular:     Rate and Rhythm: Normal rate and regular rhythm.     Heart sounds: No murmur heard.    Comments: Left upper arm fistula with gauze and clamp overlying. Pulmonary:     Effort: Pulmonary  effort is normal. No respiratory distress.  Abdominal:     General: There is no distension.     Palpations: Abdomen is soft.  Musculoskeletal:        General: Normal range of motion.     Cervical back: Normal range of motion and neck supple.  Skin:    General: Skin is warm and dry.     Coloration: Skin is not jaundiced or pale.  Neurological:     General: No focal deficit present.     Mental Status: He is alert. Mental status is at baseline.  Psychiatric:        Mood and Affect: Mood normal.        Behavior: Behavior normal.     ED Results / Procedures / Treatments   Labs (all labs ordered are listed, but only abnormal results are displayed) Labs Reviewed  CBC WITH DIFFERENTIAL/PLATELET - Abnormal; Notable for the following components:      Result Value   RBC 3.20 (*)    Hemoglobin 10.8 (*)    HCT 33.4 (*)    MCV 104.4 (*)    Platelets 108 (*)    All other components within normal limits  COMPREHENSIVE METABOLIC PANEL - Abnormal; Notable for the following components:   Sodium 133 (*)    Glucose, Bld 185 (*)    BUN 35 (*)    Creatinine, Ser 5.11 (*)    Calcium 8.5 (*)    GFR, Estimated 11 (*)    All other components within normal limits  PROTIME-INR    EKG None  Radiology No results found.  Procedures Procedures    Medications Ordered in ED Medications  lidocaine-EPINEPHrine (XYLOCAINE W/EPI) 2 %-1:200000 (PF) injection 10 mL (has no administration in time range)    ED Course/ Medical Decision Making/ A&P                             Medical Decision Making Amount and/or Complexity of Data Reviewed Labs: ordered.  Risk Prescription drug management.   This patient presents to the ED for concern of bleeding fistula, this involves an extensive number of treatment options, and is a complaint that carries with it a high risk of complications and morbidity.  The differential diagnosis includes blood loss anemia, medication side effect   Co morbidities  that complicate the patient evaluation  Prostate cancer, asthma, ESRD, GERD, HTN, anemia, CVA   Additional history obtained:  Additional history obtained from EMS External records from outside source obtained and reviewed including EMR   Lab Tests:  I Ordered, and personally interpreted labs.  The pertinent results include: Baseline anemia, elevated creatinine consistent with ESRD.  No leukocytosis  is present.  Consultations Obtained:  I requested consultation with the vascular surgeon, Dr. Scot Dock,  and discussed lab and imaging findings as well as pertinent plan - they recommend: Attempt to control bleeding here in the ED.  If unsuccessful, transfer to [facility].  The patient goes home, he should follow-up with vascular surgery in the office.   Problem List / ED Course / Critical interventions / Medication management  Patient presents with bleeding fistula.  This is a recurrent issue for him.  Onset was after his dialysis session today at 330.  He is on Eliquis.  Vital signs on arrival are normal.  Patient is overall well-appearing on exam.  Gauze and dialysis clamp are in place.  EMS states that they rechecked the fistula shortly prior to being bedded in the ED and did note continued pulsatile bleeding.  Laboratory workup was initiated.  I reached out to vascular surgeon on-call, Dr. Scot Dock, who recommends placement of a box stitch.  If it is able to be controlled, patient be discharged home.  He would benefit from outpatient vascular surgery follow-up.  If it is unable to be controlled here, patient to be transferred to [facility] for vascular surgery evaluation.  Patient's clamp and dressing were taken down and he has now achieved hemostasis of the area of his fistula.  Lab work is unremarkable.  Patient was continued to undergo monitoring here in the ED and had no recurrence of bleeding.  He was advised to follow-up with vascular surgery in clinic.  He was discharged in stable  condition.   Social Determinants of Health:  Has access to outpatient care         Final Clinical Impression(s) / ED Diagnoses Final diagnoses:  Problem with vascular access    Rx / DC Orders ED Discharge Orders     None         Godfrey Pick, MD 04/21/22 2057

## 2022-04-21 NOTE — Discharge Instructions (Signed)
You should follow-up with the vascular surgeons in the office due to your recurrent episodes of fistula bleeding.  Return to the emergency department at anytime for any further bleeding or any other new symptoms of concern.

## 2022-04-21 NOTE — ED Notes (Signed)
Dsg and clamp to L fistula removed to check for further bleeding per orders of Dr Doren Custard. No bleeding noted. Re-dressed with abd dsg and wrapped with Coban. Dr Doren Custard made aware.

## 2022-04-21 NOTE — ED Triage Notes (Signed)
Pt had dialysis today and his fistula has been bleeding for over an hour. Right now he has a clamp on the fistula and bleeding is controlled.

## 2022-07-14 ENCOUNTER — Other Ambulatory Visit: Payer: Self-pay

## 2022-07-14 ENCOUNTER — Encounter (HOSPITAL_COMMUNITY): Payer: Self-pay | Admitting: Emergency Medicine

## 2022-07-14 ENCOUNTER — Emergency Department (HOSPITAL_COMMUNITY)
Admission: EM | Admit: 2022-07-14 | Discharge: 2022-07-14 | Disposition: A | Discharge status: Home / Self Care | Payer: Medicare Other | Attending: Emergency Medicine | Admitting: Emergency Medicine

## 2022-07-14 DIAGNOSIS — R58 Hemorrhage, not elsewhere classified: Secondary | ICD-10-CM

## 2022-07-14 DIAGNOSIS — I10 Essential (primary) hypertension: Secondary | ICD-10-CM | POA: Insufficient documentation

## 2022-07-14 DIAGNOSIS — Z7901 Long term (current) use of anticoagulants: Secondary | ICD-10-CM | POA: Diagnosis not present

## 2022-07-14 DIAGNOSIS — Z79899 Other long term (current) drug therapy: Secondary | ICD-10-CM | POA: Diagnosis not present

## 2022-07-14 DIAGNOSIS — T82838A Hemorrhage of vascular prosthetic devices, implants and grafts, initial encounter: Secondary | ICD-10-CM | POA: Insufficient documentation

## 2022-07-14 DIAGNOSIS — Y69 Unspecified misadventure during surgical and medical care: Secondary | ICD-10-CM | POA: Insufficient documentation

## 2022-07-14 NOTE — Discharge Instructions (Signed)
Do not take your Eliquis tonight.  Take the bandage off tomorrow morning and if you are having any problems then you can return.  You can take the bandages off sooner if you develop any pain or numbness in that left arm.

## 2022-07-14 NOTE — ED Triage Notes (Addendum)
Pt sent over from dialysis for fistula bleeding. Per ems pt is hypertensive. Per ems pt has been bleeding for about 1.5 hours.

## 2022-07-14 NOTE — ED Provider Notes (Signed)
[name] Provider Note   CSN: [id number] Arrival date & time: 07/14/22  1720     History  Chief Complaint  Patient presents with   Coagulation Disorder    Taylor Willis is a 82 y.o. male.  Patient having bleeding from his dialysis fistula in his left arm.  He is on Eliquis.  Patient has a history of hypertension and renal failure  The history is provided by the patient and medical records. No language interpreter was used.  Arm Injury Location:  Arm Arm location:  L arm Pain details:    Quality:  Aching   Radiates to:  Does not radiate   Severity:  Mild   Onset quality:  Sudden   Timing:  Constant   Progression:  Worsening Dislocation: no   Associated symptoms: no back pain and no fatigue        Home Medications Prior to Admission medications   Medication Sig Start Date End Date Taking? Authorizing Provider  acetaminophen (TYLENOL) 500 MG tablet Take 500 mg by mouth every 6 (six) hours as needed for moderate pain.    [provider]  albuterol (PROVENTIL HFA;VENTOLIN HFA) 108 (90 BASE) MCG/ACT inhaler Inhale 2 puffs into the lungs every 6 (six) hours as needed for wheezing.    [provider]  allopurinol (ZYLOPRIM) 100 MG tablet Take 100 mg by mouth daily as needed (gout). 12/04/19   [provider]  amLODipine (NORVASC) 2.5 MG tablet Take 2.5 mg by mouth daily. 05/15/21   [provider]  apixaban (ELIQUIS) 5 MG TABS tablet Take 1 tablet (5 mg total) by mouth 2 (two) times daily. 07/07/20   Karsten Ro, MD  calcitRIOL (ROCALTROL) 0.25 MCG capsule Take 0.25 mcg by mouth 3 (three) times a week. MWF 03/21/20   [provider]  calcium acetate (PHOSLO) 667 MG capsule Take 667 mg by mouth 2 (two) times daily with a meal.    [provider]  carvedilol (COREG) 3.125 MG tablet Take 1 tablet (3.125 mg total) by mouth See admin instructions. Take 1 tablet in the evenings on  Dialysis days (Tues, Thurs, and Sat), On Non-dialysis days take 1 tablet twice a day. 04/29/20   Shon Hale, MD  Cholecalciferol 25 MCG (1000 UT) tablet Take 1,000 Units by mouth daily.  Patient not taking: Reported on 07/30/2021    [provider]  furosemide (LASIX) 40 MG tablet Take 120 mg by mouth daily. 07/17/21   [provider]  multivitamin (RENA-VIT) TABS tablet Take 1 tablet by mouth daily. 07/25/21   [provider]  sertraline (ZOLOFT) 25 MG tablet Take 25 mg by mouth daily. 07/29/21   [provider]  vitamin B-12 (CYANOCOBALAMIN) 1000 MCG tablet Take 1,000 mcg by mouth daily. Patient not taking: Reported on 07/30/2021    [provider]      Allergies    Aspirin, Hydrochlorothiazide, Thiazide-type diuretics, and Ibuprofen    Review of Systems   Review of Systems  Constitutional:  Negative for appetite change and fatigue.  HENT:  Negative for congestion, ear discharge and sinus pressure.   Eyes:  Negative for discharge.  Respiratory:  Negative for cough.   Cardiovascular:  Negative for chest pain.  Gastrointestinal:  Negative for abdominal pain and diarrhea.  Genitourinary:  Negative for frequency and hematuria.  Musculoskeletal:  Negative for back pain.       Bleeding from dialysis catheter  Skin:  Negative for rash.  Neurological:  Negative for seizures and headaches.  Psychiatric/Behavioral:  Negative for hallucinations.     Physical Exam Updated Vital Signs BP 103/63 (BP Location: Right Arm)   Pulse (!) 51 Comment: Told by tori NT  Temp 97.7 F (36.5 C) (Oral)   Resp 15   Ht 5\' 10"  (1.778 m)   Wt 70 kg   SpO2 100%   BMI 22.14 kg/m  Physical Exam Vitals and nursing note reviewed.  Constitutional:      Appearance: He is well-developed.  HENT:     Head: Normocephalic.     Nose: Nose normal.  Eyes:     General: No scleral icterus.    Conjunctiva/sclera: Conjunctivae normal.  Neck:     Thyroid: No  thyromegaly.  Cardiovascular:     Rate and Rhythm: Normal rate and regular rhythm.     Heart sounds: No murmur heard.    No friction rub. No gallop.  Pulmonary:     Breath sounds: No stridor. No wheezing or rales.  Chest:     Chest wall: No tenderness.  Abdominal:     General: There is no distension.     Tenderness: There is no abdominal tenderness. There is no rebound.  Musculoskeletal:        General: Normal range of motion.     Cervical back: Neck supple.     Comments: Bleeding from left arm from dialysis fistula  Lymphadenopathy:     Cervical: No cervical adenopathy.  Skin:    Findings: No erythema or rash.  Neurological:     Mental Status: He is alert and oriented to person, place, and time.     Motor: No abnormal muscle tone.     Coordination: Coordination normal.  Psychiatric:        Behavior: Behavior normal.     ED Results / Procedures / Treatments   Labs (all labs ordered are listed, but only abnormal results are displayed) Labs Reviewed - No data to display  EKG None  Radiology No results found.  Procedures .Ortho Injury Treatment  Date/Time: 07/18/2022 12:38 PM  Performed by: Bethann Berkshire, MD Authorized by: Bethann Berkshire, MD  Comments: Patient with bleeding from AV fistula.  She initially got quick clot and pressure applied but this did not stop the bleeding.  Then she got Surgicel with a pressure dressing and the bleeding eventually stopped       Medications Ordered in ED Medications - No data to display  ED Course/ Medical Decision Making/ A&P  Initially patient had quick clot placed on the bleeding fistula and it pressure was applied.  The bleeding did not stop.  We then used Surgicel and pressure dressing and the bleeding stopped.                           Medical Decision Making  Patient with bleeding from AV fistula.  He has a pressure dressing on now.  He will remove his pressure dressing tomorrow or sooner if he has any problems in  that arm.  He is to not take his Eliquis tonight        Final Clinical Impression(s) / ED Diagnoses Final diagnoses:  Bleeding    Rx / DC Orders ED Discharge Orders     None         Bethann Berkshire, MD 07/18/22 1239

## 2022-07-30 ENCOUNTER — Emergency Department (HOSPITAL_COMMUNITY)
Admission: EM | Admit: 2022-07-30 | Discharge: 2022-07-31 | Disposition: A | Discharge status: Home / Self Care | Payer: Medicare Other | Attending: Student | Admitting: Student

## 2022-07-30 ENCOUNTER — Encounter (HOSPITAL_COMMUNITY): Payer: Self-pay | Admitting: Emergency Medicine

## 2022-07-30 ENCOUNTER — Other Ambulatory Visit: Payer: Self-pay

## 2022-07-30 DIAGNOSIS — Z87891 Personal history of nicotine dependence: Secondary | ICD-10-CM | POA: Insufficient documentation

## 2022-07-30 DIAGNOSIS — Z8546 Personal history of malignant neoplasm of prostate: Secondary | ICD-10-CM | POA: Insufficient documentation

## 2022-07-30 DIAGNOSIS — T82590A Other mechanical complication of surgically created arteriovenous fistula, initial encounter: Secondary | ICD-10-CM | POA: Diagnosis present

## 2022-07-30 DIAGNOSIS — T829XXA Unspecified complication of cardiac and vascular prosthetic device, implant and graft, initial encounter: Secondary | ICD-10-CM

## 2022-07-30 DIAGNOSIS — Z79899 Other long term (current) drug therapy: Secondary | ICD-10-CM | POA: Diagnosis not present

## 2022-07-30 DIAGNOSIS — Z992 Dependence on renal dialysis: Secondary | ICD-10-CM | POA: Diagnosis not present

## 2022-07-30 DIAGNOSIS — Z8673 Personal history of transient ischemic attack (TIA), and cerebral infarction without residual deficits: Secondary | ICD-10-CM | POA: Diagnosis not present

## 2022-07-30 DIAGNOSIS — Z7951 Long term (current) use of inhaled steroids: Secondary | ICD-10-CM | POA: Insufficient documentation

## 2022-07-30 DIAGNOSIS — Z7901 Long term (current) use of anticoagulants: Secondary | ICD-10-CM | POA: Diagnosis not present

## 2022-07-30 DIAGNOSIS — J4520 Mild intermittent asthma, uncomplicated: Secondary | ICD-10-CM | POA: Insufficient documentation

## 2022-07-30 DIAGNOSIS — I120 Hypertensive chronic kidney disease with stage 5 chronic kidney disease or end stage renal disease: Secondary | ICD-10-CM | POA: Diagnosis not present

## 2022-07-30 DIAGNOSIS — N186 End stage renal disease: Secondary | ICD-10-CM | POA: Diagnosis not present

## 2022-07-30 NOTE — ED Triage Notes (Signed)
Pt finished dialysis today and would not stop bleeding after nurse took needles out. Pt arrived, a/o. Clamp at site. Little blood noted to bandage. Pt denies pain. Color wnl.

## 2022-07-31 NOTE — ED Provider Notes (Signed)
[place] EMERGENCY DEPARTMENT AT [facility] Provider Note  CSN: [id number] Arrival date & time: 07/30/22 1656  Chief Complaint(s) Vascular Access Problem  HPI Taylor Willis is a 82 y.o. 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  Staff at dialysis facility reportedly had Pressure for 90 minutes and the patient continued to have oozing from the site.  Here in the emergency department, bleeding has stopped on arrival.   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   BMI 31.0-31.9,adult 04/23/2020   ESRD (end stage renal disease) (HCC) 04/10/2020   Acute metabolic encephalopathy 04/10/2020   Hyponatremia 04/10/2020   Acute respiratory failure with hypoxia (HCC) 04/04/2020   Pulmonary edema 04/04/2020   GERD (gastroesophageal reflux disease) 04/04/2020   Prostate cancer (HCC) 04/04/2020   History of asthma 04/04/2020   CKD (chronic kidney disease) stage 5, GFR less than 15 ml/min (HCC) 10/12/2019   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 Authorizing Provider  acetaminophen (TYLENOL) 500 MG tablet Take 500 mg by mouth every 6 (six) hours as needed for moderate pain.    [provider]  albuterol (PROVENTIL HFA;VENTOLIN HFA) 108 (90 BASE) MCG/ACT inhaler Inhale 2 puffs into the lungs every 6 (six) hours as needed for wheezing.    [provider]  allopurinol (ZYLOPRIM) 100 MG tablet Take 100 mg by mouth daily as needed (gout). 12/04/19   [provider]  amLODipine (NORVASC) 2.5 MG tablet Take 2.5 mg by mouth daily. 05/15/21   [provider]  apixaban (ELIQUIS) 5 MG TABS tablet Take 1 tablet (5 mg total) by mouth 2 (two) times daily. 07/07/20   Karsten Ro, MD  calcitRIOL (ROCALTROL) 0.25 MCG capsule Take 0.25 mcg by mouth 3 (three) times a week. MWF 03/21/20   [provider]  calcium acetate (PHOSLO) 667 MG capsule Take 667 mg by mouth 2 (two) times daily with a meal.    [provider]  carvedilol (COREG) 3.125 MG tablet Take 1 tablet (3.125 mg total) by mouth See admin instructions. Take 1 tablet in the evenings on Dialysis days (Tues, Thurs, and Sat), On Non-dialysis days take 1 tablet twice a day. 04/29/20   Shon Hale, MD  Cholecalciferol 25  MCG (1000 UT) tablet Take 1,000 Units by mouth daily.  Patient not taking: Reported on 07/30/2021    [provider]  furosemide (LASIX) 40 MG tablet Take 120 mg by mouth daily. 07/17/21   [provider]  multivitamin (RENA-VIT) TABS tablet Take 1 tablet by mouth daily. 07/25/21   [provider]  sertraline (ZOLOFT) 25 MG tablet Take 25 mg by mouth daily. 07/29/21   [provider]  vitamin B-12  (CYANOCOBALAMIN) 1000 MCG tablet Take 1,000 mcg by mouth daily. Patient not taking: Reported on 07/30/2021    [provider]                                                                                                                                    Past Surgical History Past Surgical History:  Procedure Laterality Date   AV FISTULA PLACEMENT Left 02/15/2019   Procedure: ARTERIOVENOUS (AV) FISTULA CREATION LEFT ARM;  Surgeon: Larina Earthly, MD;  Location: [facility] OR;  Service: Vascular;  Laterality: Left;   CATARACT EXTRACTION W/ INTRAOCULAR LENS  IMPLANT, BILATERAL     CYSTOSCOPY/RETROGRADE/URETEROSCOPY/STONE EXTRACTION WITH BASKET Right 05/23/2012   Procedure: CYSTOSCOPY/RETROGRADE/URETEROSCOPY/STONE EXTRACTION WITH BASKET;  Surgeon: Valetta Fuller, MD;  Location: [facility];  Service: Urology;  Laterality: Right;   FRACTURE SURGERY Right    as a child   HERNIA REPAIR     HOLMIUM LASER APPLICATION Right 05/23/2012   Procedure: HOLMIUM LASER APPLICATION;  Surgeon: Valetta Fuller, MD;  Location: [facility];  Service: Urology;  Laterality: Right;   INSERTION OF DIALYSIS CATHETER Right 04/08/2020   Procedure: INSERTION OF TUNNELED DIALYSIS CATHETER;  Surgeon: Lucretia Roers, MD;  Location: [facility];  Service: General;  Laterality: Right;   LIGATION OF ARTERIOVENOUS  FISTULA Left 09/12/2019   Procedure: SIDE BRANCH LIGATION  OF LEFT ARM ARTERIOVENOUS FISTULA;  Surgeon: Sherren Kerns, MD;  Location: [facility] OR;  Service: Vascular;  Laterality: Left;   MULTIPLE TOOTH EXTRACTIONS     PERIPHERAL VASCULAR BALLOON ANGIOPLASTY  05/22/2020   Procedure: PERIPHERAL VASCULAR BALLOON ANGIOPLASTY;  Surgeon: Leonie Douglas, MD;  Location: [facility] INVASIVE [facility];  Service: Cardiovascular;;  Left arm fistula   REVISON OF ARTERIOVENOUS FISTULA Left 09/12/2019   Procedure: REVISON OF LEFT ARM ARTERIOVENOUS FISTULA;  Surgeon: Sherren Kerns, MD;  Location: [facility] OR;  Service:  Vascular;  Laterality: Left;   ROBOT ASSISTED LAPAROSCOPIC RADICAL PROSTATECTOMY  2011   TONSILLECTOMY     Family History History reviewed. No pertinent family history.  Social History Social History   Tobacco Use   Smoking status: Former    Types: Cigarettes    Quit date: 05/18/1982    Years since quitting: 40.2   Smokeless tobacco: Never  Vaping Use   Vaping Use: Never used  Substance Use Topics   Alcohol use: Not Currently    Alcohol/week: 1.0 standard drink of  alcohol    Types: 1 Standard drinks or equivalent per week   Drug use: Never   Allergies Aspirin, Hydrochlorothiazide, Thiazide-type diuretics, and Ibuprofen  Review of Systems Review of Systems  All other systems reviewed and are negative.   Physical Exam Vital Signs  I have reviewed the triage vital signs BP 131/63 (BP Location: Right Arm)   Pulse (!) 54   Temp 97.9 F (36.6 C) (Oral)   Resp 16   SpO2 100%   Physical Exam Constitutional:      General: He is not in acute distress.    Appearance: Normal appearance.  HENT:     Head: Normocephalic and atraumatic.     Nose: No congestion or rhinorrhea.  Eyes:     General:        Right eye: No discharge.        Left eye: No discharge.     Extraocular Movements: Extraocular movements intact.     Pupils: Pupils are equal, round, and reactive to light.  Cardiovascular:     Rate and Rhythm: Normal rate and regular rhythm.     Heart sounds: No murmur heard. Pulmonary:     Effort: No respiratory distress.     Breath sounds: No wheezing or rales.  Abdominal:     General: There is no distension.     Tenderness: There is no abdominal tenderness.  Musculoskeletal:        General: Normal range of motion.     Cervical back: Normal range of motion.  Skin:    General: Skin is warm and dry.  Neurological:     General: No focal deficit present.     Mental Status: He is alert.     ED Results and Treatments Labs (all labs ordered are listed, but only  abnormal results are displayed) Labs Reviewed - No data to display                                                                                                                        Radiology No results found.  Pertinent labs & imaging results that were available during my care of the patient were reviewed by me and considered in my medical decision making (see MDM for details).  Medications Ordered in ED Medications - No data to display  Procedures Procedures  (including critical care time)  Medical Decision Making / ED Course   This patient presents to the ED for concern of bleeding dialysis fistula, this involves an extensive number of treatment options, and is a complaint that carries with it a high risk of complications and morbidity.  The differential diagnosis includes bleeding dialysis fistula, coagulopathy AV fistula complication  MDM: Patient seen emergency room for evaluation of the bleeding dialysis fistula.  Physical exam reveals a left upper extremity dialysis fistula at that has successfully clotted off and is currently not bleeding.  I gave the patient and his son a packet of quick clot/combat gauze and instructed them on how to use this with Coban to stop bleeding at home to prevent further ER presentations.  With symptoms improved patient does not meet inpatient criteria for admission and he is safe for discharge with outpatient follow-up.   Additional history obtained: -Additional history obtained from son -External records from outside source obtained and reviewed including: Chart review including previous notes, labs, imaging, consultation notes    Medicines ordered and prescription drug management: No orders of the defined types were placed in this encounter.   -I have reviewed the patients home medicines and have made adjustments  as needed  Critical interventions none 2 Reevaluation: After the interventions noted above, I reevaluated the patient and found that they have :improved  Co morbidities that complicate the patient evaluation  Past Medical History:  Diagnosis Date   Arthritis    gout   Asthma    has rescue inhaler   Cancer (HCC)    prostate   Chronic kidney disease    GERD (gastroesophageal reflux disease)    H/O peptic ulcer 1970's   Herpes 04/28/2020   Hyperlipidemia    Hypertension    Ureteral stone    Wears dentures    Wears glasses       Dispostion: I considered admission for this patient, but at this time he does not meet inpatient criteria for admission and he is safe for discharge with outpatient follow-up     Final Clinical Impression(s) / ED Diagnoses Final diagnoses:  Complication of arteriovenous dialysis fistula, initial encounter     @PCDICTATION @    Glendora Score, MD 07/31/22 [phone]

## 2022-08-15 ENCOUNTER — Other Ambulatory Visit: Payer: Self-pay

## 2022-08-15 ENCOUNTER — Emergency Department (HOSPITAL_COMMUNITY)
Admission: EM | Admit: 2022-08-15 | Discharge: 2022-08-15 | Disposition: A | Discharge status: Home / Self Care | Payer: Medicare Other | Attending: Emergency Medicine | Admitting: Emergency Medicine

## 2022-08-15 ENCOUNTER — Encounter (HOSPITAL_COMMUNITY): Payer: Self-pay

## 2022-08-15 DIAGNOSIS — Z992 Dependence on renal dialysis: Secondary | ICD-10-CM | POA: Insufficient documentation

## 2022-08-15 DIAGNOSIS — T82590A Other mechanical complication of surgically created arteriovenous fistula, initial encounter: Secondary | ICD-10-CM | POA: Insufficient documentation

## 2022-08-15 DIAGNOSIS — I120 Hypertensive chronic kidney disease with stage 5 chronic kidney disease or end stage renal disease: Secondary | ICD-10-CM | POA: Diagnosis not present

## 2022-08-15 DIAGNOSIS — N186 End stage renal disease: Secondary | ICD-10-CM | POA: Diagnosis not present

## 2022-08-15 DIAGNOSIS — Z7901 Long term (current) use of anticoagulants: Secondary | ICD-10-CM | POA: Diagnosis not present

## 2022-08-15 DIAGNOSIS — T829XXA Unspecified complication of cardiac and vascular prosthetic device, implant and graft, initial encounter: Secondary | ICD-10-CM

## 2022-08-15 DIAGNOSIS — Z8546 Personal history of malignant neoplasm of prostate: Secondary | ICD-10-CM | POA: Insufficient documentation

## 2022-08-15 DIAGNOSIS — Z79899 Other long term (current) drug therapy: Secondary | ICD-10-CM | POA: Insufficient documentation

## 2022-08-15 NOTE — ED Provider Notes (Signed)
[place] EMERGENCY DEPARTMENT AT [facility] Provider Note   CSN: [id number] Arrival date & time: 08/15/22  1613     History  No chief complaint on file.   Taylor Willis is a 82 y.o. male.  HPI   82 year old male presents emergency department with complaints of bleeding from AV fistula.  Patient states he received dialysis today and noticed oozing from site.  EMS placed a pressure dressing on patient's AV fistula upon transport and noted cessation of bleeding.  Patient denies any pain or bleeding since EMS addressed site.  Patient receives dialysis Tuesday Thursday Saturday and Saturday full session today.  Patient requesting discharge.  Past medical history significant for ESRD on hemodialysis, hyperlipidemia, hypertension, prostate cancer, GERD, hyperparathyroidism, CVA  Home Medications Prior to Admission medications   Medication Sig Start Date End Date Taking? Authorizing Provider  acetaminophen (TYLENOL) 500 MG tablet Take 500 mg by mouth every 6 (six) hours as needed for moderate pain.    [provider]  albuterol (PROVENTIL HFA;VENTOLIN HFA) 108 (90 BASE) MCG/ACT inhaler Inhale 2 puffs into the lungs every 6 (six) hours as needed for wheezing.    [provider]  allopurinol (ZYLOPRIM) 100 MG tablet Take 100 mg by mouth daily as needed (gout). 12/04/19   [provider]  amLODipine (NORVASC) 2.5 MG tablet Take 2.5 mg by mouth daily. 05/15/21   [provider]  apixaban (ELIQUIS) 5 MG TABS tablet Take 1 tablet (5 mg total) by mouth 2 (two) times daily. 07/07/20   Karsten Ro, MD  calcitRIOL (ROCALTROL) 0.25 MCG capsule Take 0.25 mcg by mouth 3 (three) times a week. MWF 03/21/20   [provider]  calcium acetate (PHOSLO) 667 MG capsule Take 667 mg by mouth 2 (two) times daily with a meal.    [provider]  carvedilol (COREG) 3.125 MG tablet Take 1 tablet (3.125 mg total) by mouth See admin instructions. Take 1  tablet in the evenings on Dialysis days (Tues, Thurs, and Sat), On Non-dialysis days take 1 tablet twice a day. 04/29/20   Shon Hale, MD  Cholecalciferol 25 MCG (1000 UT) tablet Take 1,000 Units by mouth daily.  Patient not taking: Reported on 07/30/2021    [provider]  furosemide (LASIX) 40 MG tablet Take 120 mg by mouth daily. 07/17/21   [provider]  multivitamin (RENA-VIT) TABS tablet Take 1 tablet by mouth daily. 07/25/21   [provider]  sertraline (ZOLOFT) 25 MG tablet Take 25 mg by mouth daily. 07/29/21   [provider]  vitamin B-12 (CYANOCOBALAMIN) 1000 MCG tablet Take 1,000 mcg by mouth daily. Patient not taking: Reported on 07/30/2021    [provider]      Allergies    Aspirin, Hydrochlorothiazide, Thiazide-type diuretics, and Ibuprofen    Review of Systems   Review of Systems  All other systems reviewed and are negative.   Physical Exam Updated Vital Signs BP (!) 140/65 (BP Location: Right Arm)   Pulse (!) 54   Temp 98.3 F (36.8 C) (Oral)   Resp 16   Ht 6' (1.829 m)   Wt 99.8 kg   SpO2 99%   BMI 29.84 kg/m  Physical Exam Vitals and nursing note reviewed.  Constitutional:      General: He is not in acute distress.    Appearance: He is well-developed.  HENT:     Head: Normocephalic and atraumatic.  Eyes:     Conjunctiva/sclera: Conjunctivae normal.  Cardiovascular:     Rate and Rhythm: Normal rate and regular rhythm.     Comments: Left upper extremity fistula with pressure dressing applied.  No active oozing/drainage from fistula. Pulmonary:     Effort: Pulmonary effort is normal. No respiratory distress.     Breath sounds: Normal breath sounds.  Abdominal:     Palpations: Abdomen is soft.     Tenderness: There is no abdominal tenderness.  Musculoskeletal:        General: No swelling.     Cervical back: Neck supple.  Skin:    General: Skin is warm and dry.     Capillary Refill: Capillary refill  takes less than 2 seconds.  Neurological:     Mental Status: He is alert.  Psychiatric:        Mood and Affect: Mood normal.     ED Results / Procedures / Treatments   Labs (all labs ordered are listed, but only abnormal results are displayed) Labs Reviewed - No data to display  EKG None  Radiology No results found.  Procedures Procedures    Medications Ordered in ED Medications - No data to display  ED Course/ Medical Decision Making/ A&P                             Medical Decision Making  This patient presents to the ED for concern of complications with AV fistula, this involves an extensive number of treatment options, and is a complaint that carries with it a high risk of complications and morbidity.  The differential diagnosis includes bleeding, coagulopathy   Co morbidities that complicate the patient evaluation  See HPI   Additional history obtained:  Additional history obtained from EMR External records from outside source obtained and reviewed including hospital records   Lab Tests:  N/a   Imaging Studies ordered:  N/a   Cardiac Monitoring: / EKG:  The patient was maintained on a cardiac monitor.  I personally viewed and interpreted the cardiac monitored which showed an underlying rhythm of: Sinus rhythm   Consultations Obtained:  N/a   Problem List / ED Course / Critical interventions / Medication management  Complications with AV fistula Reevaluation of the patient showed that the patient stayed the same I have reviewed the patients home medicines and have made adjustments as needed   Social Determinants of Health:  Former cigarette use.  Denies illicit drug use.   Test / Admission - Considered:  Complications with AV fistula Vitals signs significant for mild hypertension with blood pressure 140/65. Otherwise within normal range and stable throughout visit. 82 year old male presents emergency department with complaints of  bleeding from AV fistula after dialysis session earlier today.  Upon arrival, patient had cessation of bleeding after pressure dressing was applied by EMS.  Patient observed for 1 hour plus while in the ED without repeat bleeding.  Will recommend continued dressing and follow-up with dialysis for subsequent session.  Treatment plan discussed at length with patient and he acknowledged understanding was agreeable to the plan.  Patient overall well-appearing, afebrile no acute distress. Worrisome signs and symptoms were discussed with the patient, and the patient acknowledged understanding to return to the ED if noticed. Patient was stable upon discharge.          Final Clinical Impression(s) / ED Diagnoses Final diagnoses:  Complication of AV dialysis fistula, initial encounter    Rx / DC Orders ED Discharge Orders  None         Peter Garter, [place] 08/15/22 1707    Vanetta Mulders, MD 08/16/22 1524

## 2022-08-15 NOTE — Discharge Instructions (Addendum)
As discussed, visit today overall reassuring.  I would recommend keeping dressing in place until you follow-up for next dialysis session.  Please do not hesitate to return to emergency department if the worrisome signs and symptoms we discussed become apparent.

## 2022-08-15 NOTE — ED Triage Notes (Signed)
Pt BIB [facility] from dialysis in [place] after port would not stop bleeding after procedure.   EMS placed pressure dressing and clamp PTA. Dressing still in place, no bleeding visualized at this time.   Pt A & O x 4, NAD. Bleeding controlled.

## 2022-08-16 ENCOUNTER — Other Ambulatory Visit: Payer: Self-pay

## 2022-08-16 ENCOUNTER — Encounter (HOSPITAL_COMMUNITY): Payer: Self-pay | Admitting: Emergency Medicine

## 2022-08-16 ENCOUNTER — Emergency Department (HOSPITAL_COMMUNITY)
Admission: EM | Admit: 2022-08-16 | Discharge: 2022-08-16 | Disposition: A | Discharge status: Home / Self Care | Payer: Medicare Other | Attending: Emergency Medicine | Admitting: Emergency Medicine

## 2022-08-16 DIAGNOSIS — Z7901 Long term (current) use of anticoagulants: Secondary | ICD-10-CM | POA: Diagnosis not present

## 2022-08-16 DIAGNOSIS — Y69 Unspecified misadventure during surgical and medical care: Secondary | ICD-10-CM | POA: Insufficient documentation

## 2022-08-16 DIAGNOSIS — R58 Hemorrhage, not elsewhere classified: Secondary | ICD-10-CM

## 2022-08-16 DIAGNOSIS — T82838A Hemorrhage of vascular prosthetic devices, implants and grafts, initial encounter: Secondary | ICD-10-CM | POA: Diagnosis not present

## 2022-08-16 NOTE — ED Notes (Signed)
Pressure dressing removed by Dr. Estell Harpin, bleeding continues.  Wound clot placed along with quick clot and pressure dressing.  Pt repositioned with arm elevated higher.  Will monitor.

## 2022-08-16 NOTE — ED Triage Notes (Signed)
Pt to ER via EMS form home with c/o left arm AV fistula bleeding.  Pt was seen here yesterday for same and bleeding was controlled overnight.  Wife was attempting to take the bandage off this AM when bleeding started again.  Family placed clamp and bleeding was controlled until pt arrival when clamp slipped and bleeding became uncontrolled.  Dr. Estell Harpin to bedside, manual pressure applied.  Manual pressure replaced with surgicel, quickclot and pressure bandage.  Arm elevated on pillows at this time.

## 2022-08-16 NOTE — ED Provider Notes (Signed)
[place] EMERGENCY DEPARTMENT AT [facility] Provider Note   CSN: [id number] Arrival date & time: 08/16/22  6295     History  Chief Complaint  Patient presents with   AV fistula Bleeding    TYRAIL GRANDFIELD is a 82 y.o. male.  Patient complains of bleeding from his AV fistula.  Patient is a dialysis patient  The history is provided by the patient and medical records. No language interpreter was used.  Arm Injury Upper extremity pain location: Left arm has AV fistula with bleeding. Pain details:    Quality:  Aching   Severity:  Mild   Onset quality:  Sudden   Timing:  Constant   Progression:  Worsening Dislocation: no   Associated symptoms: no back pain and no fatigue        Home Medications Prior to Admission medications   Medication Sig Start Date End Date Taking? Authorizing Provider  acetaminophen (TYLENOL) 500 MG tablet Take 500 mg by mouth every 6 (six) hours as needed for moderate pain.    [provider]  albuterol (PROVENTIL HFA;VENTOLIN HFA) 108 (90 BASE) MCG/ACT inhaler Inhale 2 puffs into the lungs every 6 (six) hours as needed for wheezing.    [provider]  allopurinol (ZYLOPRIM) 100 MG tablet Take 100 mg by mouth daily as needed (gout). 12/04/19   [provider]  amLODipine (NORVASC) 2.5 MG tablet Take 2.5 mg by mouth daily. 05/15/21   [provider]  apixaban (ELIQUIS) 5 MG TABS tablet Take 1 tablet (5 mg total) by mouth 2 (two) times daily. 07/07/20   Karsten Ro, MD  calcitRIOL (ROCALTROL) 0.25 MCG capsule Take 0.25 mcg by mouth 3 (three) times a week. MWF 03/21/20   [provider]  calcium acetate (PHOSLO) 667 MG capsule Take 667 mg by mouth 2 (two) times daily with a meal.    [provider]  carvedilol (COREG) 3.125 MG tablet Take 1 tablet (3.125 mg total) by mouth See admin instructions. Take 1 tablet in the evenings on Dialysis days (Tues, Thurs, and Sat), On Non-dialysis days take 1  tablet twice a day. 04/29/20   Shon Hale, MD  Cholecalciferol 25 MCG (1000 UT) tablet Take 1,000 Units by mouth daily.  Patient not taking: Reported on 07/30/2021    [provider]  furosemide (LASIX) 40 MG tablet Take 120 mg by mouth daily. 07/17/21   [provider]  multivitamin (RENA-VIT) TABS tablet Take 1 tablet by mouth daily. 07/25/21   [provider]  sertraline (ZOLOFT) 25 MG tablet Take 25 mg by mouth daily. 07/29/21   [provider]  vitamin B-12 (CYANOCOBALAMIN) 1000 MCG tablet Take 1,000 mcg by mouth daily. Patient not taking: Reported on 07/30/2021    [provider]      Allergies    Aspirin, Hydrochlorothiazide, Thiazide-type diuretics, and Ibuprofen    Review of Systems   Review of Systems  Constitutional:  Negative for appetite change and fatigue.  HENT:  Negative for congestion, ear discharge and sinus pressure.   Eyes:  Negative for discharge.  Respiratory:  Negative for cough.   Cardiovascular:  Negative for chest pain.  Gastrointestinal:  Negative for abdominal pain and diarrhea.  Genitourinary:  Negative for frequency and hematuria.  Musculoskeletal:  Negative for back pain.       Bleeding from fistula in left arm  Skin:  Negative for rash.  Neurological:  Negative for seizures and headaches.  Psychiatric/Behavioral:  Negative for hallucinations.  Physical Exam Updated Vital Signs BP 123/70   Pulse (!) 59   Temp 97.9 F (36.6 C)   Resp 18   Ht 6' (1.829 m)   Wt 99.8 kg   SpO2 97%   BMI 29.84 kg/m  Physical Exam  ED Results / Procedures / Treatments   Labs (all labs ordered are listed, but only abnormal results are displayed) Labs Reviewed - No data to display  EKG None  Radiology No results found.  Procedures Procedures    Medications Ordered in ED Medications - No data to display  ED Course/ Medical Decision Making/ A&P                             Medical Decision  Making  Patient with bleeding from AV fistula.  This is controlled with quick clot and pressure dressing.  He will return if problems and have dialysis Tuesday        Final Clinical Impression(s) / ED Diagnoses Final diagnoses:  Bleeding    Rx / DC Orders ED Discharge Orders     None         Bethann Berkshire, MD 08/17/22 1731

## 2022-08-16 NOTE — Discharge Instructions (Signed)
Take bandage off tomorrow.  Return if any problems.

## 2022-09-10 ENCOUNTER — Emergency Department (HOSPITAL_COMMUNITY)
Admission: EM | Admit: 2022-09-10 | Discharge: 2022-09-10 | Disposition: A | Discharge status: Home / Self Care | Payer: Medicare Other

## 2022-09-10 DIAGNOSIS — T82838A Hemorrhage of vascular prosthetic devices, implants and grafts, initial encounter: Secondary | ICD-10-CM

## 2022-09-10 DIAGNOSIS — Z992 Dependence on renal dialysis: Secondary | ICD-10-CM | POA: Diagnosis not present

## 2022-09-10 DIAGNOSIS — Z7901 Long term (current) use of anticoagulants: Secondary | ICD-10-CM | POA: Insufficient documentation

## 2022-09-10 LAB — BASIC METABOLIC PANEL
Anion gap: 11 (ref 5–15)
BUN: 14 mg/dL (ref 8–23)
CO2: 26 mmol/L (ref 22–32)
Calcium: 8 mg/dL — ABNORMAL LOW (ref 8.9–10.3)
Chloride: 97 mmol/L — ABNORMAL LOW (ref 98–111)
Creatinine, Ser: 3.38 mg/dL — ABNORMAL HIGH (ref 0.61–1.24)
GFR, Estimated: 18 mL/min — ABNORMAL LOW (ref >60)
Glucose, Bld: 138 mg/dL — ABNORMAL HIGH (ref 70–99)
Potassium: 3.2 mmol/L — ABNORMAL LOW (ref 3.5–5.1)
Sodium: 134 mmol/L — ABNORMAL LOW (ref 135–145)

## 2022-09-10 LAB — CBC WITH DIFFERENTIAL/PLATELET
Abs Immature Granulocytes: 0.01 10*3/uL (ref 0.00–0.07)
Basophils Absolute: 0 10*3/uL (ref 0.0–0.1)
Basophils Relative: 1 %
Eosinophils Absolute: 0.2 10*3/uL (ref 0.0–0.5)
Eosinophils Relative: 4 %
HCT: 29.7 % — ABNORMAL LOW (ref 39.0–52.0)
Hemoglobin: 9.2 g/dL — ABNORMAL LOW (ref 13.0–17.0)
Immature Granulocytes: 0 %
Lymphocytes Relative: 29 %
Lymphs Abs: 1.4 10*3/uL (ref 0.7–4.0)
MCH: 32.7 pg (ref 26.0–34.0)
MCHC: 31 g/dL (ref 30.0–36.0)
MCV: 105.7 fL — ABNORMAL HIGH (ref 80.0–100.0)
Monocytes Absolute: 0.6 10*3/uL (ref 0.1–1.0)
Monocytes Relative: 13 %
Neutro Abs: 2.6 10*3/uL (ref 1.7–7.7)
Neutrophils Relative %: 53 %
Platelets: 100 10*3/uL — ABNORMAL LOW (ref 150–400)
RBC: 2.81 MIL/uL — ABNORMAL LOW (ref 4.22–5.81)
RDW: 14.3 % (ref 11.5–15.5)
WBC: 4.9 10*3/uL (ref 4.0–10.5)
nRBC: 0 % (ref 0.0–0.2)

## 2022-09-10 LAB — TYPE AND SCREEN
ABO/RH(D): A POS
Antibody Screen: NEGATIVE

## 2022-09-10 NOTE — Discharge Instructions (Signed)
Keep dressing on site for the next 24 hours.  Follow up with your Physicain for recheck

## 2022-09-10 NOTE — ED Provider Notes (Signed)
[place] EMERGENCY DEPARTMENT AT [facility] Provider Note   CSN: [id number] Arrival date & time: 09/10/22  1454     History  Chief Complaint  Patient presents with   Vascular Access Problem    Taylor Willis is a 82 y.o. male.  Patient complains of bleeding from dialysis graft.  Patient had dialysis today and graft would not stop bleeding.  Patient reports that this has happened multiple times in the past.  Patient has a past medical history of end-stage renal disease and anemia.  Bleeding was stopped by EMS with a tourniquet.  EMS reports patient had approximately 500 cc of volume loss.  Patient denies any complaints other than the bleeding from the graft site.  The history is provided by the patient. No language interpreter was used.       Home Medications Prior to Admission medications   Medication Sig Start Date End Date Taking? Authorizing Provider  acetaminophen (TYLENOL) 500 MG tablet Take 500 mg by mouth every 6 (six) hours as needed for moderate pain.    [provider]  albuterol (PROVENTIL HFA;VENTOLIN HFA) 108 (90 BASE) MCG/ACT inhaler Inhale 2 puffs into the lungs every 6 (six) hours as needed for wheezing.    [provider]  allopurinol (ZYLOPRIM) 100 MG tablet Take 100 mg by mouth daily as needed (gout). 12/04/19   [provider]  amLODipine (NORVASC) 2.5 MG tablet Take 2.5 mg by mouth daily. 05/15/21   [provider]  apixaban (ELIQUIS) 5 MG TABS tablet Take 1 tablet (5 mg total) by mouth 2 (two) times daily. 07/07/20   Karsten Ro, MD  calcitRIOL (ROCALTROL) 0.25 MCG capsule Take 0.25 mcg by mouth 3 (three) times a week. MWF 03/21/20   [provider]  calcium acetate (PHOSLO) 667 MG capsule Take 667 mg by mouth 2 (two) times daily with a meal.    [provider]  carvedilol (COREG) 3.125 MG tablet Take 1 tablet (3.125 mg total) by mouth See admin instructions. Take 1 tablet in the evenings on Dialysis  days (Tues, Thurs, and Sat), On Non-dialysis days take 1 tablet twice a day. 04/29/20   Shon Hale, MD  Cholecalciferol 25 MCG (1000 UT) tablet Take 1,000 Units by mouth daily.  Patient not taking: Reported on 07/30/2021    [provider]  furosemide (LASIX) 40 MG tablet Take 120 mg by mouth daily. 07/17/21   [provider]  multivitamin (RENA-VIT) TABS tablet Take 1 tablet by mouth daily. 07/25/21   [provider]  sertraline (ZOLOFT) 25 MG tablet Take 25 mg by mouth daily. 07/29/21   [provider]  vitamin B-12 (CYANOCOBALAMIN) 1000 MCG tablet Take 1,000 mcg by mouth daily. Patient not taking: Reported on 07/30/2021    [provider]      Allergies    Aspirin, Hydrochlorothiazide, Thiazide-type diuretics, and Ibuprofen    Review of Systems   Review of Systems  Skin:  Positive for wound.  All other systems reviewed and are negative.   Physical Exam Updated Vital Signs BP (!) 88/56   Resp 18  Physical Exam Vitals and nursing note reviewed.  Constitutional:      General: He is not in acute distress.    Appearance: He is well-developed.  HENT:     Head: Normocephalic and atraumatic.  Eyes:     Conjunctiva/sclera: Conjunctivae normal.  Cardiovascular:     Rate and Rhythm: Normal rate and regular rhythm.  Pulmonary:  Effort: Pulmonary effort is normal.     Breath sounds: Normal breath sounds.  Abdominal:     Palpations: Abdomen is soft.  Musculoskeletal:        General: No swelling.     Cervical back: Neck supple.     Comments: Dialysis graft left upper arm, tourniquet in place bleeding controlled  Skin:    General: Skin is warm and dry.     Capillary Refill: Capillary refill takes less than 2 seconds.  Neurological:     Mental Status: He is alert.  Psychiatric:        Mood and Affect: Mood normal.     ED Results / Procedures / Treatments   Labs (all labs ordered are listed, but only abnormal results are  displayed) Labs Reviewed  BASIC METABOLIC PANEL - Abnormal; Notable for the following components:      Result Value   Sodium 134 (*)    Potassium 3.2 (*)    Chloride 97 (*)    Glucose, Bld 138 (*)    Creatinine, Ser 3.38 (*)    Calcium 8.0 (*)    GFR, Estimated 18 (*)    All other components within normal limits  CBC WITH DIFFERENTIAL/PLATELET - Abnormal; Notable for the following components:   RBC 2.81 (*)    Hemoglobin 9.2 (*)    HCT 29.7 (*)    MCV 105.7 (*)    Platelets 100 (*)    All other components within normal limits  I-STAT CHEM 8, ED  TYPE AND SCREEN    EKG None  Radiology No results found.  Procedures Procedures    Medications Ordered in ED Medications - No data to display  ED Course/ Medical Decision Making/ A&P                                 Medical Decision Making Patient complains of bleeding from dialysis graft in left arm  Amount and/or Complexity of Data Reviewed Labs: ordered. Decision-making details documented in ED Course.    Details: Amatory evaluations ordered reviewed and interpreted.  Patient's hemoglobin is 9.2.  Patient's most recent hemoglobin was 10.8.  Risk Risk Details: Removed by Dr. Gwenlyn Fudge.  No bleeding at this time.  Gauze pressure dressing applied to site. Patient observed for extended period of time.  No further bleeding.  Patient feels well.  He does not feel lightheaded. Patient is stable for discharge at this time he is advised to follow-up with his physician for recheck.           Final Clinical Impression(s) / ED Diagnoses Final diagnoses:  Bleeding from dialysis shunt, initial encounter [facility])    Rx / DC Orders ED Discharge Orders     None      An After Visit Summary was printed and given to the patient.    Elson Areas, [place] 09/10/22 1715    Pricilla Loveless, MD 09/12/22 [phone]

## 2022-09-10 NOTE — ED Triage Notes (Signed)
Pt here via ems from dialysis with a bleeding fistula. Per ems, estimated 500-600cc blood loss. Pt pale on arrival with BP 70's systolic with ems. EMS unable to obtain IV access. Pt on anticoags. Tourniquet applied at 1428 and removed by dr Criss Alvine at [phone]. Bleeding appears to be controlled on arrival.

## 2022-09-21 ENCOUNTER — Telehealth (HOSPITAL_COMMUNITY): Payer: Self-pay | Admitting: *Deleted

## 2022-09-21 NOTE — Telephone Encounter (Signed)
Received fax from Dr Cherylann Ratel due to multiple episodes of excessive and prolonged bleeding.  Will give to [facility].

## 2022-09-24 ENCOUNTER — Other Ambulatory Visit: Payer: Self-pay

## 2022-09-24 DIAGNOSIS — R58 Hemorrhage, not elsewhere classified: Secondary | ICD-10-CM

## 2022-09-24 MED ORDER — SODIUM CHLORIDE 0.9% FLUSH: 3.0000 mL | Freq: Two times a day (BID) | INTRAVENOUS | Status: DC

## 2022-09-24 MED ORDER — SODIUM CHLORIDE 0.9 % IV SOLN: 250.0000 mL | INTRAVENOUS | Status: DC | PRN

## 2022-10-09 ENCOUNTER — Ambulatory Visit (HOSPITAL_COMMUNITY)
Admission: RE | Admit: 2022-10-09 | Discharge: 2022-10-09 | Disposition: A | Discharge status: Home / Self Care | Payer: Medicare Other | Attending: Vascular Surgery | Admitting: Vascular Surgery

## 2022-10-09 ENCOUNTER — Encounter (HOSPITAL_COMMUNITY)
Admission: RE | Disposition: A | Discharge status: Home / Self Care | Payer: Self-pay | Source: Home / Self Care | Attending: Vascular Surgery

## 2022-10-09 DIAGNOSIS — Y841 Kidney dialysis as the cause of abnormal reaction of the patient, or of later complication, without mention of misadventure at the time of the procedure: Secondary | ICD-10-CM | POA: Diagnosis not present

## 2022-10-09 DIAGNOSIS — T82858A Stenosis of vascular prosthetic devices, implants and grafts, initial encounter: Secondary | ICD-10-CM | POA: Diagnosis present

## 2022-10-09 DIAGNOSIS — I120 Hypertensive chronic kidney disease with stage 5 chronic kidney disease or end stage renal disease: Secondary | ICD-10-CM | POA: Insufficient documentation

## 2022-10-09 DIAGNOSIS — T82838A Hemorrhage of vascular prosthetic devices, implants and grafts, initial encounter: Secondary | ICD-10-CM | POA: Diagnosis not present

## 2022-10-09 DIAGNOSIS — T82898A Other specified complication of vascular prosthetic devices, implants and grafts, initial encounter: Secondary | ICD-10-CM | POA: Diagnosis not present

## 2022-10-09 DIAGNOSIS — Z992 Dependence on renal dialysis: Secondary | ICD-10-CM | POA: Diagnosis not present

## 2022-10-09 DIAGNOSIS — Z87891 Personal history of nicotine dependence: Secondary | ICD-10-CM | POA: Diagnosis not present

## 2022-10-09 DIAGNOSIS — N185 Chronic kidney disease, stage 5: Secondary | ICD-10-CM | POA: Diagnosis not present

## 2022-10-09 DIAGNOSIS — N186 End stage renal disease: Secondary | ICD-10-CM

## 2022-10-09 DIAGNOSIS — R58 Hemorrhage, not elsewhere classified: Secondary | ICD-10-CM

## 2022-10-09 HISTORY — PX: A/V FISTULAGRAM: CATH118298

## 2022-10-09 HISTORY — PX: PERIPHERAL VASCULAR BALLOON ANGIOPLASTY: CATH118281

## 2022-10-09 LAB — POCT I-STAT, CHEM 8
BUN: 19 mg/dL (ref 8–23)
Calcium, Ion: 1.13 mmol/L — ABNORMAL LOW (ref 1.15–1.40)
Chloride: 98 mmol/L (ref 98–111)
Creatinine, Ser: 4.3 mg/dL — ABNORMAL HIGH (ref 0.61–1.24)
Glucose, Bld: 72 mg/dL (ref 70–99)
HCT: 32 % — ABNORMAL LOW (ref 39.0–52.0)
Hemoglobin: 10.9 g/dL — ABNORMAL LOW (ref 13.0–17.0)
Potassium: 4.2 mmol/L (ref 3.5–5.1)
Sodium: 138 mmol/L (ref 135–145)
TCO2: 30 mmol/L (ref 22–32)

## 2022-10-09 SURGERY — A/V FISTULAGRAM
Anesthesia: LOCAL | Laterality: Left

## 2022-10-09 MED ORDER — HEPARIN (PORCINE) IN NACL 1000-0.9 UT/500ML-% IV SOLN
INTRAVENOUS | Status: DC | PRN
  Administered 2022-10-09: 500 mL

## 2022-10-09 MED ORDER — SODIUM CHLORIDE 0.9% FLUSH: 3.0000 mL | INTRAVENOUS | Status: DC | PRN

## 2022-10-09 MED ORDER — IODIXANOL 320 MG/ML IV SOLN
INTRAVENOUS | Status: DC | PRN
  Administered 2022-10-09: 35 mL

## 2022-10-09 MED ORDER — LIDOCAINE HCL (PF) 1 % IJ SOLN
INTRAMUSCULAR | Status: AC
  Filled 2022-10-09: qty 30

## 2022-10-09 MED ORDER — LIDOCAINE HCL (PF) 1 % IJ SOLN
INTRAMUSCULAR | Status: DC | PRN
  Administered 2022-10-09: 15 mL

## 2022-10-09 SURGICAL SUPPLY — 14 items
BALLN MUSTANG 6X80X75 (BALLOONS) ×1
BALLN MUSTANG 8X20X75 (BALLOONS) ×1
BALLOON MUSTANG 6X80X75 (BALLOONS) IMPLANT
BALLOON MUSTANG 8X20X75 (BALLOONS) IMPLANT
COVER DOME SNAP 22 D (MISCELLANEOUS) ×1 IMPLANT
GLIDEWIRE ADV .035X180CM (WIRE) IMPLANT
KIT ENCORE 26 ADVANTAGE (KITS) IMPLANT
KIT MICROPUNCTURE NIT STIFF (SHEATH) IMPLANT
KIT SYRINGE INJ CVI SPIKEX1 (MISCELLANEOUS) IMPLANT
SET ATX-X65L (MISCELLANEOUS) IMPLANT
SHEATH PINNACLE 6F 10CM (SHEATH) IMPLANT
SHEATH PROBE COVER 6X72 (BAG) ×1 IMPLANT
TRAY PV CATH (CUSTOM PROCEDURE TRAY) ×1 IMPLANT
TUBING CIL FLEX 10 FLL-RA (TUBING) ×1 IMPLANT

## 2022-10-09 NOTE — H&P (Signed)
VASC[facility] AND [facility]IN SPECIALISTS OF [place]  ASSESSMENT / PLAN: 82 y.o. male with malfunctioning left brachiocephalic arteriovenous fistula. Plan fistulagram in cath lab.  CHIEF COMPLAINT: malfunctioning fistula  HISTORY OF PRESENT ILLNESS: Taylor Willis is a 82 y.o. 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  This has started to become problematic during dialysis sessions.  He has noticed prolonged bleeding after decannulation.  Past Medical History:  Diagnosis Date   Arthritis    gout   Asthma    has rescue inhaler   Cancer (HCC)    prostate   Chronic kidney disease    GERD (gastroesophageal reflux disease)    H/O peptic ulcer 1970's   Herpes 04/28/2020   Hyperlipidemia    Hypertension    Ureteral stone    Wears dentures    Wears glasses     Past Surgical History:  Procedure Laterality Date   AV FISTULA PLACEMENT Left 02/15/2019   Procedure: ARTERIOVENOUS (AV) FISTULA CREATION LEFT ARM;  Surgeon: Larina Earthly, MD;  Location: [facility] OR;  Service: Vascular;  Laterality: Left;   CATARACT EXTRACTION W/ INTRAOCULAR LENS  IMPLANT, BILATERAL     CYSTOSCOPY/RETROGRADE/URETEROSCOPY/STONE EXTRACTION WITH BASKET Right 05/23/2012   Procedure: CYSTOSCOPY/RETROGRADE/URETEROSCOPY/STONE EXTRACTION WITH BASKET;  Surgeon: Valetta Fuller, MD;  Location: [facility];  Service: Urology;  Laterality: Right;   FRACTURE SURGERY Right    as a child   HERNIA REPAIR     HOLMIUM LASER APPLICATION Right 05/23/2012   Procedure: HOLMIUM LASER APPLICATION;  Surgeon: Valetta Fuller, MD;  Location: [facility];  Service: Urology;  Laterality: Right;   INSERTION OF DIALYSIS CATHETER Right 04/08/2020   Procedure: INSERTION OF TUNNELED DIALYSIS CATHETER;  Surgeon: Lucretia Roers, MD;  Location: [facility];  Service: General;  Laterality: Right;   LIGATION OF ARTERIOVENOUS  FISTULA Left 09/12/2019   Procedure: SIDE BRANCH  LIGATION  OF LEFT ARM ARTERIOVENOUS FISTULA;  Surgeon: Sherren Kerns, MD;  Location: [facility] OR;  Service: Vascular;  Laterality: Left;   MULTIPLE TOOTH EXTRACTIONS     PERIPHERAL VASCULAR BALLOON ANGIOPLASTY  05/22/2020   Procedure: PERIPHERAL VASCULAR BALLOON ANGIOPLASTY;  Surgeon: Leonie Douglas, MD;  Location: [facility] INVASIVE CV LAB;  Service: Cardiovascular;;  Left arm fistula   REVISON OF ARTERIOVENOUS FISTULA Left 09/12/2019   Procedure: REVISON OF LEFT ARM ARTERIOVENOUS FISTULA;  Surgeon: Sherren Kerns, MD;  Location: [facility] OR;  Service: Vascular;  Laterality: Left;   ROBOT ASSISTED LAPAROSCOPIC RADICAL PROSTATECTOMY  2011   TONSILLECTOMY      No family history on file.  Social History   Socioeconomic History   Marital status: Married    Spouse name: Not on file   Number of children: Not on file   Years of education: Not on file   Highest education level: Not on file  Occupational History   Not on file  Tobacco Use   Smoking status: Former    Current packs/day: 0.00    Types: Cigarettes    Quit date: 05/18/1982    Years since quitting: 40.4   Smokeless tobacco: Never  Vaping Use   Vaping status: Never Used  Substance and Sexual Activity   Alcohol use: Not Currently    Alcohol/week: 1.0 standard drink of alcohol    Types: 1 Standard drinks or equivalent per week   Drug use: Never   Sexual activity: Not Currently  Other Topics Concern   Not on file  Social History Narrative   Not on file   Social Determinants of Health   Financial Resource Strain: Low Risk  (02/27/2022)   Received from [facility], [facility]   Overall Financial Resource Strain (CARDIA)    Difficulty of Paying Living Expenses: Not hard at all  Food Insecurity: No Food Insecurity (02/27/2022)   Received from [facility], [facility]   Hunger Vital Sign    Worried About Running Out of Food in the Last Year: Never true    Ran Out of Food in the Last Year: Never true  Transportation  Needs: No Transportation Needs (02/27/2022)   Received from [facility], [facility]   [facility] - Transportation    Lack of Transportation (Medical): No    Lack of Transportation (Non-Medical): No  Physical Activity: Inactive (02/27/2022)   Received from [facility], [facility]   Exercise Vital Sign    Days of Exercise per Week: 0 days    Minutes of Exercise per Session: 0 min  Stress: No Stress Concern Present (02/27/2022)   Received from [facility], [facility] of Occupational [facility] - Occupational Stress Questionnaire    Feeling of Stress : Not at all  Social Connections: Socially Integrated (02/27/2022)   Received from [facility], [facility]   Social Connection and Isolation Panel [NHANES]    Frequency of Communication with Friends and Family: More than three times a week    Frequency of Social Gatherings with Friends and Family: Twice a week    Attends Religious Services: More than 4 times per year    Active Member of [employer] or Organizations: Yes    Attends Engineer, structural: More than 4 times per year    Marital Status: Married  Catering manager Violence: Not At Risk (02/27/2022)   Received from [facility], [facility]   Humiliation, Afraid, Rape, and Kick questionnaire    Fear of Current or Ex-Partner: No    Emotionally Abused: No    Physically Abused: No    Sexually Abused: No    Allergies  Allergen Reactions   Aspirin Other (See Comments)    GI irritation    Hydrochlorothiazide     Dizziness and unable to walk   Thiazide-Type Diuretics Other (See Comments)    Dizziness and unable to walk   Ibuprofen Other (See Comments)    GI irritation     Current Facility-Administered Medications  Medication Dose Route Frequency Provider Last Rate Last Admin   Heparin (Porcine) in NaCl 1000-0.9 UT/500ML-% SOLN    PRN Leonie Douglas, MD   500 mL at 10/09/22 0756   iodixanol (VISIPAQUE) 320 MG/ML  injection    PRN Leonie Douglas, MD   35 mL at 10/09/22 0815   lidocaine (PF) (XYLOCAINE) 1 % injection    PRN Leonie Douglas, MD   15 mL at 10/09/22 0753   sodium chloride flush (NS) 0.9 % injection 3 mL  3 mL Intravenous PRN Leonie Douglas, MD        PHYSICAL EXAM Vitals:   10/09/22 0659 10/09/22 0700 10/09/22 0755  BP:  (!) 160/65   Pulse:  (!) 58   Resp: 18    Temp:  98 F (36.7 C)   TempSrc:  Tympanic   SpO2: 94%  100%  Weight: 98.4 kg    Height: 6' (1.829 m)  Elderly man in no distress Left arm brachiocephalic arteriovenous fistula with strong pulsatility suggesting outflow stenosis  PERTINENT LABORATORY AND RADIOLOGIC DATA  Most recent CBC    Latest Ref Rng & Units 10/09/2022    7:22 AM 09/10/2022    3:27 PM 04/21/2022    7:03 PM  CBC  WBC 4.0 - 10.5 K/uL  4.9  5.9   Hemoglobin 13.0 - 17.0 g/dL 16.1  9.2  09.6   Hematocrit 39.0 - 52.0 % 32.0  29.7  33.4   Platelets 150 - 400 K/uL  100  108      Most recent CMP    Latest Ref Rng & Units 10/09/2022    7:22 AM 09/10/2022    3:27 PM 04/21/2022    7:03 PM  CMP  Glucose 70 - 99 mg/dL 72  045  409   BUN 8 - 23 mg/dL 19  14  35   Creatinine 0.61 - 1.24 mg/dL 8.11  9.14  7.82   Sodium 135 - 145 mmol/L 138  134  133   Potassium 3.5 - 5.1 mmol/L 4.2  3.2  4.0   Chloride 98 - 111 mmol/L 98  97  98   CO2 22 - 32 mmol/L  26  22   Calcium 8.9 - 10.3 mg/dL  8.0  8.5   Total Protein 6.5 - 8.1 g/dL   7.5   Total Bilirubin 0.3 - 1.2 mg/dL   0.5   Alkaline Phos 38 - 126 U/L   78   AST 15 - 41 U/L   30   ALT 0 - 44 U/L   17     Renal function Estimated Creatinine Clearance: 16.4 mL/min (A) (by C-G formula based on SCr of 4.3 mg/dL (H)).  Hgb A1c MFr Bld (%)  Date Value  07/06/2020 5.4    LDL Cholesterol  Date Value Ref Range Status  07/06/2020 59 0 - 99 mg/dL Final    Comment:           Total Cholesterol/HDL:CHD Risk Coronary Heart Disease Risk Table                     Men   Women  1/2 Average Risk   3.4    3.3  Average Risk       5.0   4.4  2 X Average Risk   9.6   7.1  3 X Average Risk  23.4   11.0        Use the calculated Patient Ratio above and the CHD Risk Table to determine the patient's CHD Risk.        ATP III CLASSIFICATION (LDL):  <100     mg/dL   Optimal  956-213  mg/dL   Near or Above                    Optimal  130-159  mg/dL   Borderline  086-578  mg/dL   High  >469     mg/dL   Very High Performed at [facility], [address] [place], [place] [address]     Rande Brunt. Lenell Antu, MD FACS [facility] Special[facility] of [facility] Phone Number: [phone] 10/09/2022 8:15 AM   Total time spent on preparing this encounter including chart review, data review, collecting history, examining the patient, coordinating care for this established patient, 30 minutes.  Portions of this report may have been transcribed using voice recognition software.  Every effort has been made to ensure accuracy; however, inadvertent computerized transcription errors may still be present.

## 2022-10-09 NOTE — Progress Notes (Signed)
Patient and wife was given discharge instructions. Both verbalized understanding. 

## 2022-10-09 NOTE — Op Note (Signed)
DATE OF SERVICE: 10/09/2022  PATIENT:  Taylor Willis  82 y.o. male  PRE-OPERATIVE DIAGNOSIS:  ESRD, malfunctioning AV fistula  POST-OPERATIVE DIAGNOSIS:  Same  PROCEDURE:   1) left arm fistulagram 2) angioplasty of fistula outflow (8x19mm Mustang)  SURGEON:  Surgeons and Role:    * Leonie Douglas, MD - Primary  ASSISTANT: none  ANESTHESIA:   local  EBL: minimal  BLOOD ADMINISTERED:none  DRAINS: none   LOCAL MEDICATIONS USED:  LIDOCAINE   SPECIMEN:  none  COUNTS: confirmed correct.  TOURNIQUET:  none  PATIENT DISPOSITION:  PACU - hemodynamically stable.   Delay start of Pharmacological VTE agent (>24hrs) due to surgical blood loss or risk of bleeding: no  INDICATION FOR PROCEDURE: Taylor Willis is a 82 y.o. male with ESRD and malfunctioning AV fistula. After careful discussion of risks, benefits, and alternatives the patient was offered fistulagram. The patient  understood and wished to proceed.  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 The patient was positioned supine on the operating room table. Anesthesia was induced. The left arm was prepped and draped in standard fashion. A surgical pause was performed confirming correct patient, procedure, and operative location.  The fistula was accessed with micropuncture technique.  Fistulogram was performed in stations.  A Glidewire advantage was navigated into the inferior vena cava.  Access was upsized to 6 [place].  Angioplasty was performed of the cephalic arch and the subclavian  vein.  An 8 x 20 mm Mustang balloon was used.  Good technical result was achieved.  Improved flow was noted through the fistula.  Improved thrill was felt in the fistula.  All endovascular equipment was removed.  A pursestring stitch was placed around the access and secured down.  Hemostasis was achieved.  A bandage was applied.  Upon completion of the case instrument and sharps counts were confirmed correct. The patient was transferred to the PACU in good condition. I was present for all portions of the procedure.  FOLLOW UP PLAN: Okay to use fistula.  If prolonged bleeding continues, would need cephalic vein turndown or conversion to arteriovenous graft.  Follow-up with me as needed  Rande Brunt. Lenell Antu, MD [facility] Vascular and Vein Specialists of [facility] Phone Number: [phone] 10/09/2022 8:17 AM

## 2022-10-12 ENCOUNTER — Encounter (HOSPITAL_COMMUNITY): Payer: Self-pay | Admitting: Vascular Surgery

## 2022-12-17 ENCOUNTER — Emergency Department (HOSPITAL_COMMUNITY)
Admission: EM | Admit: 2022-12-17 | Discharge: 2022-12-17 | Disposition: A | Discharge status: Home / Self Care | Payer: Medicare Other | Attending: Emergency Medicine | Admitting: Emergency Medicine

## 2022-12-17 ENCOUNTER — Other Ambulatory Visit: Payer: Self-pay

## 2022-12-17 DIAGNOSIS — Z7901 Long term (current) use of anticoagulants: Secondary | ICD-10-CM | POA: Diagnosis not present

## 2022-12-17 DIAGNOSIS — Y69 Unspecified misadventure during surgical and medical care: Secondary | ICD-10-CM | POA: Insufficient documentation

## 2022-12-17 DIAGNOSIS — T82838A Hemorrhage of vascular prosthetic devices, implants and grafts, initial encounter: Secondary | ICD-10-CM | POA: Insufficient documentation

## 2022-12-17 LAB — CBC WITH DIFFERENTIAL/PLATELET
Abs Immature Granulocytes: 0 10*3/uL (ref 0.00–0.07)
Basophils Absolute: 0 10*3/uL (ref 0.0–0.1)
Basophils Relative: 1 %
Eosinophils Absolute: 0.1 10*3/uL (ref 0.0–0.5)
Eosinophils Relative: 3 %
HCT: 33.4 % — ABNORMAL LOW (ref 39.0–52.0)
Hemoglobin: 10.4 g/dL — ABNORMAL LOW (ref 13.0–17.0)
Immature Granulocytes: 0 %
Lymphocytes Relative: 26 %
Lymphs Abs: 1.1 10*3/uL (ref 0.7–4.0)
MCH: 30.8 pg (ref 26.0–34.0)
MCHC: 31.1 g/dL (ref 30.0–36.0)
MCV: 98.8 fL (ref 80.0–100.0)
Monocytes Absolute: 0.5 10*3/uL (ref 0.1–1.0)
Monocytes Relative: 13 %
Neutro Abs: 2.4 10*3/uL (ref 1.7–7.7)
Neutrophils Relative %: 57 %
Platelets: 104 10*3/uL — ABNORMAL LOW (ref 150–400)
RBC: 3.38 MIL/uL — ABNORMAL LOW (ref 4.22–5.81)
RDW: 15.9 % — ABNORMAL HIGH (ref 11.5–15.5)
WBC: 4.1 10*3/uL (ref 4.0–10.5)
nRBC: 0 % (ref 0.0–0.2)

## 2022-12-17 LAB — BASIC METABOLIC PANEL
Anion gap: 9 (ref 5–15)
BUN: 11 mg/dL (ref 8–23)
CO2: 30 mmol/L (ref 22–32)
Calcium: 8.2 mg/dL — ABNORMAL LOW (ref 8.9–10.3)
Chloride: 98 mmol/L (ref 98–111)
Creatinine, Ser: 2.94 mg/dL — ABNORMAL HIGH (ref 0.61–1.24)
GFR, Estimated: 21 mL/min — ABNORMAL LOW (ref >60)
Glucose, Bld: 106 mg/dL — ABNORMAL HIGH (ref 70–99)
Potassium: 3.5 mmol/L (ref 3.5–5.1)
Sodium: 137 mmol/L (ref 135–145)

## 2022-12-17 MED ORDER — MORPHINE SULFATE (PF) 2 MG/ML IV SOLN
2.0000 mg | Freq: Once | INTRAVENOUS | Status: AC
  Administered 2022-12-17: 2 mg via INTRAVENOUS
  Filled 2022-12-17: qty 1

## 2022-12-17 NOTE — ED Notes (Signed)
EDP, charge RN, and this RN loosened tourniquet completely at 1710, hemastatic dressing and cohesive bandage to be left in place at this time per EDP. Bleeding appears to be controlled at this time.

## 2022-12-17 NOTE — ED Notes (Signed)
EDP informed pt and family member at bedside that fistula can be reassessed at 1705-1710

## 2022-12-17 NOTE — ED Triage Notes (Signed)
Pt arrived via [facility] from dialysis center in [place] c/o bleeding fistula. Per EMS, tourniquet was applied at 1535 to L upper arm above fistula location, per EMS, bleeding stopped for three minutes post tourniquet application and then started profusely bleeding again, unable to control bleeding. EDP at bedside. Pt is A&O, tourniquet  still in place along with two trauma  bandages

## 2022-12-17 NOTE — ED Provider Notes (Addendum)
[place] EMERGENCY DEPARTMENT AT [facility] Provider Note   CSN: [id number] Arrival date & time: 12/17/22  1545     History  Chief Complaint  Patient presents with   Coagulation Disorder    Taylor Willis is a 82 y.o. male.  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  His dialysis center is in [place].  He completed dialysis today when they removed the catheters.  He had bleeding from his AV fistula.  EMS placed a tourniquet.  But he arrived here still with blood squirting.  The tourniquet was not tight enough.  Tourniquet time was marked at 1535.  But again it was not tight enough so it really had not stopped the bleeding.  I tightened the tourniquet and then we placed a quick clot dressing on top.  And then wrapped it with Coban.  We kept the tourniquet it up until it been on for a total of 1.5 hours.  With that including from the time the EMS put in on at 1535.  We took the tourniquet down patient held the bleeding we observed him basic labs were obtained CBC and metabolic panel.  Patient is normally dialyzed Tuesday Thursdays and Saturdays.  Patient has some memory deficit.  But he will follow commands.       Home Medications Prior to Admission medications   Medication Sig Start Date End Date Taking? Authorizing Provider  acetaminophen (TYLENOL) 500 MG tablet Take 500 mg by mouth every 6 (six) hours as needed for moderate pain.   Yes [provider]  albuterol (PROVENTIL HFA;VENTOLIN HFA) 108 (90 BASE) MCG/ACT inhaler Inhale 2 puffs into the lungs every 6 (six) hours as needed for wheezing.   Yes [provider]  allopurinol (ZYLOPRIM) 100 MG tablet Take 100 mg by mouth at bedtime. 12/04/19  Yes [provider]  apixaban (ELIQUIS) 5 MG TABS tablet Take 1 tablet (5 mg total) by mouth 2 (two) times daily. Patient taking differently: Take 2.5 mg by mouth See admin instructions. Take 2.5 mg Tues., Thurs.,and Sat, in the evening Take  2.5 mg on Monday, Wednesday, Friday and Sunday twice a day 07/07/20  Yes Doda, Traci Sermon, MD  B Complex-C-Folic Acid (TRIPHROCAPS) 1 MG CAPS Take 1 capsule by mouth daily.   Yes [provider]  calcitRIOL (ROCALTROL) 0.25 MCG capsule Take 0.25 mcg by mouth Every Tuesday,Thursday,and Saturday with dialysis.   Yes [provider]  carvedilol (COREG) 3.125 MG tablet Take 1 tablet (3.125 mg total) by mouth See admin instructions. Take 1 tablet in the evenings on Dialysis days (Tues, Thurs, and Sat), On Non-dialysis days take 1 tablet twice a day. Patient taking differently: Take 3.125 mg by mouth every evening. 04/29/20  Yes Emokpae, Courage, MD  furosemide (LASIX) 40 MG tablet Take 120 mg by mouth daily. 07/17/21  Yes [provider]  multivitamin (RENA-VIT) TABS tablet Take 1 tablet by mouth at bedtime. 07/25/21  Yes [provider]  rosuvastatin (CRESTOR) 5 MG tablet Take 5 mg by mouth at bedtime.   Yes [provider]  sertraline (ZOLOFT) 25 MG tablet Take 25 mg by mouth daily. 07/29/21  Yes [provider]  sevelamer carbonate (RENVELA) 800 MG tablet Take 2,400 mg by mouth 2 (two) times daily. 09/19/22  Yes [provider]  traZODone (DESYREL) 50 MG tablet Take 25 mg by mouth at bedtime.   Yes [provider]  vitamin B-12 (CYANOCOBALAMIN) 1000 MCG tablet Take 1,000 mcg by  mouth daily.   Yes [provider]      Allergies    Aspirin, Hydrochlorothiazide, Thiazide-type diuretics, and Ibuprofen    Review of Systems   Review of Systems  Constitutional:  Negative for chills and fever.  HENT:  Negative for ear pain and sore throat.   Eyes:  Negative for pain and visual disturbance.  Respiratory:  Negative for cough and shortness of breath.   Cardiovascular:  Negative for chest pain and palpitations.  Gastrointestinal:  Negative for abdominal pain and vomiting.  Genitourinary:  Negative for dysuria and hematuria.   Musculoskeletal:  Negative for arthralgias and back pain.  Skin:  Negative for color change and rash.  Neurological:  Negative for seizures and syncope.  Hematological:  Bruises/bleeds easily.  All other systems reviewed and are negative.   Physical Exam Updated Vital Signs BP (!) 159/69   Pulse 63   Temp 98 F (36.7 C)   Resp 18   SpO2 96%  Physical Exam Vitals and nursing note reviewed.  Constitutional:      General: He is not in acute distress.    Appearance: He is well-developed.  HENT:     Head: Normocephalic and atraumatic.  Eyes:     Conjunctiva/sclera: Conjunctivae normal.  Cardiovascular:     Rate and Rhythm: Normal rate and regular rhythm.     Heart sounds: No murmur heard. Pulmonary:     Effort: Pulmonary effort is normal. No respiratory distress.     Breath sounds: Normal breath sounds.  Abdominal:     Palpations: Abdomen is soft.     Tenderness: There is no abdominal tenderness.  Musculoskeletal:        General: No swelling.     Cervical back: Neck supple.     Comments: From the of left upper extremity where the AV fistula is had to pinpoint bleeding points that was squirting blood approximately a foot and a half into the air.  Tightening up the tourniquet does stop this bleeding.  Skin:    General: Skin is warm and dry.     Capillary Refill: Capillary refill takes less than 2 seconds.  Neurological:     General: No focal deficit present.     Mental Status: He is alert. Mental status is at baseline.  Psychiatric:        Mood and Affect: Mood normal.     ED Results / Procedures / Treatments   Labs (all labs ordered are listed, but only abnormal results are displayed) Labs Reviewed  CBC WITH DIFFERENTIAL/PLATELET - Abnormal; Notable for the following components:      Result Value   RBC 3.38 (*)    Hemoglobin 10.4 (*)    HCT 33.4 (*)    RDW 15.9 (*)    Platelets 104 (*)    All other components within normal limits  BASIC METABOLIC PANEL -  Abnormal; Notable for the following components:   Glucose, Bld 106 (*)    Creatinine, Ser 2.94 (*)    Calcium 8.2 (*)    GFR, Estimated 21 (*)    All other components within normal limits    EKG None  Radiology No results found.  Procedures Procedures    Medications Ordered in ED Medications  morphine (PF) 2 MG/ML injection 2 mg (2 mg Intravenous Given 12/17/22 1626)    ED Course/ Medical Decision Making/ A&P  Medical Decision Making Amount and/or Complexity of Data Reviewed Labs: ordered.  Risk Prescription drug management.   Patient responded well to the tourniquet being up.  It was up for a total period of time 1.5 hours.  We took it down at 1705.  And then observed him no further bleeding.  The Coban in place with the clot dressing in place.  Patient's labs here are reassuring white count 4.1 hemoglobin 10.4 platelets 104.  Basic metabolic panel consistent with him being dialyzed today potassium was 3.5 GFR 21.  Patient stable for discharge home we will keep the Coban and the dressing in place.  He can take that off tomorrow.  Due for dialysis again on Saturday.  CRITICAL CARE Performed by: Vanetta Mulders Total critical care time: 35 minutes Critical care time was exclusive of separately billable procedures and treating other patients. Critical care was necessary to treat or prevent imminent or life-threatening deterioration. Critical care was time spent personally by me on the following activities: development of treatment plan with patient and/or surrogate as well as nursing, discussions with consultants, evaluation of patient's response to treatment, examination of patient, obtaining history from patient or surrogate, ordering and performing treatments and interventions, ordering and review of laboratory studies, ordering and review of radiographic studies, pulse oximetry and re-evaluation of patient's condition.    Final Clinical  Impression(s) / ED Diagnoses Final diagnoses:  Bleeding due to dialysis catheter placement, initial encounter [facility])    Rx / DC Orders ED Discharge Orders     None         Vanetta Mulders, MD 12/17/22 1925    Vanetta Mulders, MD 12/17/22 1925

## 2022-12-17 NOTE — ED Notes (Signed)
Per MD, the max time the tourniquet can be in place ranges from an hour and a half to 2 hours. EMS applied tournet at 1535--However, the fistula was still squirting blood at that time. EDP tightened tourniquet and bleeding controlled, can reassess fistula at 1 1/2 to 2 hour mark from application time

## 2022-12-17 NOTE — Discharge Instructions (Signed)
Keep dressing in place.  You can take it off tomorrow.  Okay to follow-up with dialysis as scheduled on Saturday.  Today's labs without any significant abnormalities.

## 2022-12-17 NOTE — ED Notes (Signed)
Removed trauma bandages that EMS applied/ held continued pressure on even with a tourniquet and blood was squirting from fistula, applied two large hemostatic dressings on the two sites it was bleeding from the fistula. Then placed 4 x 4 gauze on top and wrapped with cohesive bandage per verbal order by MD, Tourniquet still in place and tightened by EDP--bleeding appears to be controlled at this time

## 2022-12-17 NOTE — ED Notes (Signed)
Bleeding still appears to be controlled at this time, dressing still in place, tourniquet still completely loosened, L forearm does not appear as cyanotic as it previously was when tourniquet was on

## 2023-02-07 IMAGING — CT CT HEAD W/O CM
4 series · 16 of 47 positions shown, 18 images · non-contrast
Comparison: Brain MRI/MRA 07/05/2020 and earlier.

CLINICAL DATA: 79-year-old male with left PCA and small cerebellar
infarcts.

EXAM:
CT HEAD WITHOUT CONTRAST
TECHNIQUE: Contiguous axial images were obtained from the base of the skull
through the vertex without intravenous contrast.

[Series 3: head without · axial · non-contrast · 0.42mm/px · z∈[-61,+64]mm · 7 of 35 slices shown, 9 images]
[im 5/35  brain]
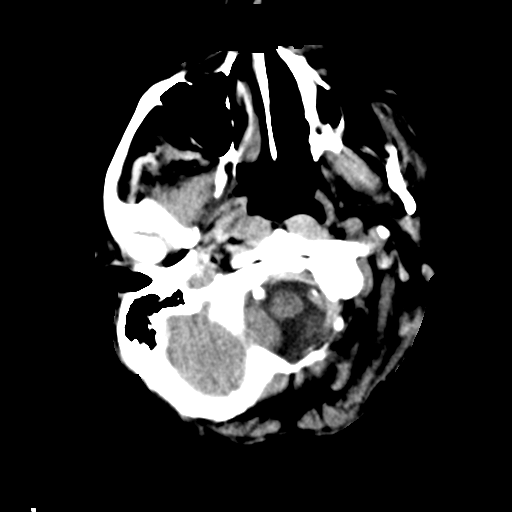
[im 5/35  bone]
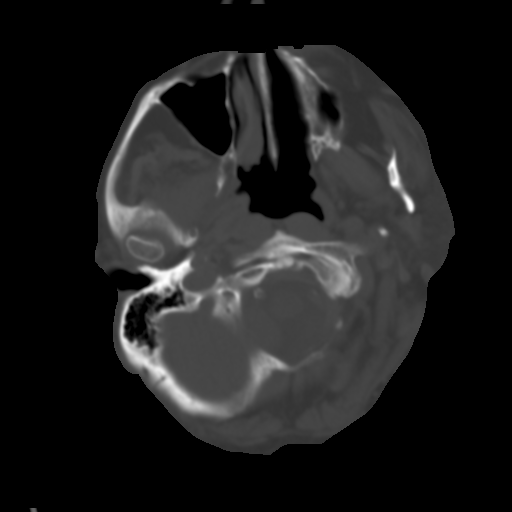
[im 9/35  brain]
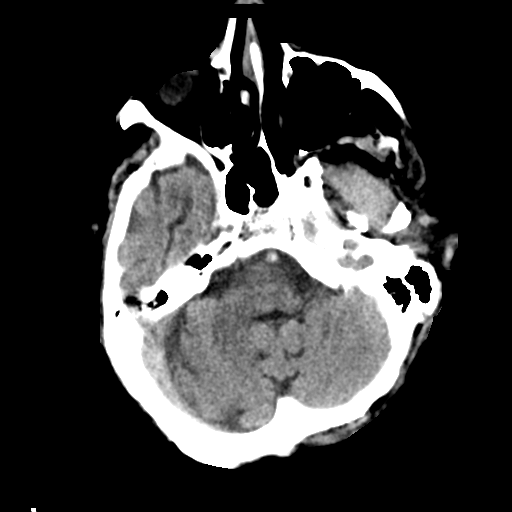
[im 13/35  brain]
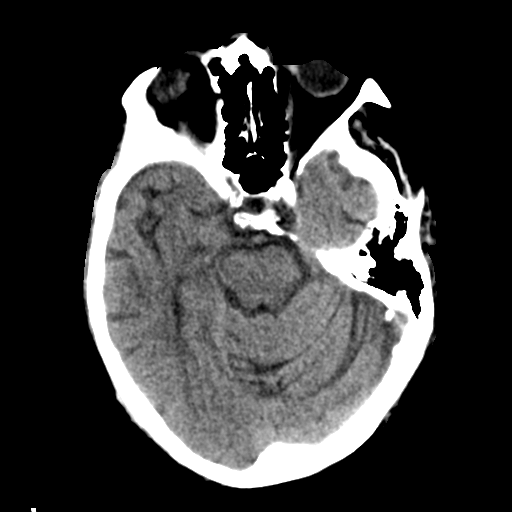
[im 18/35  brain]
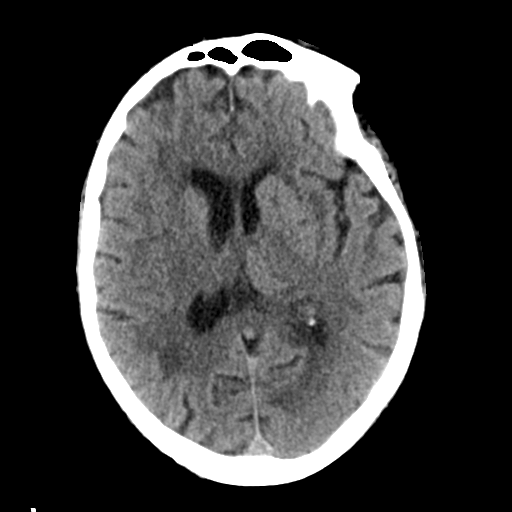
[im 22/35  brain]
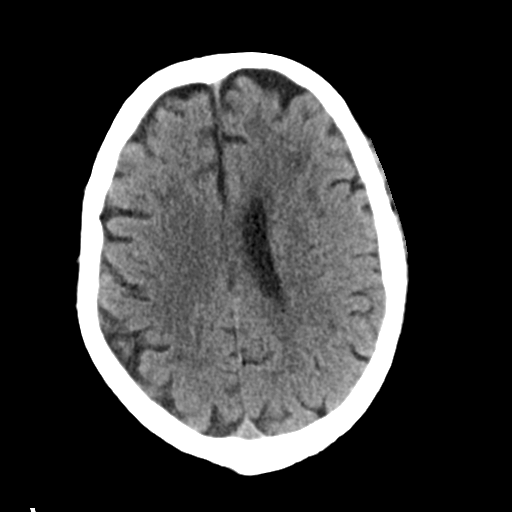
[im 22/35  bone]
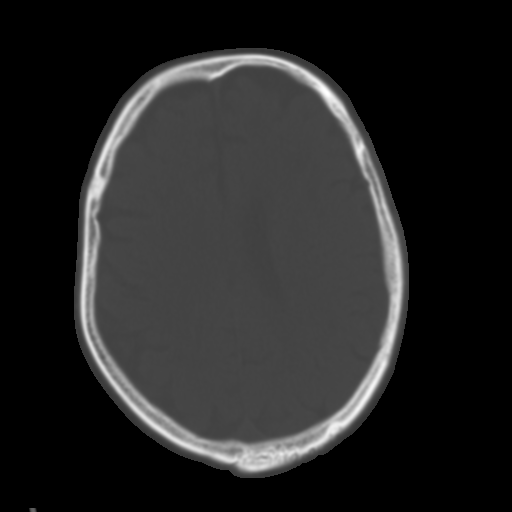
[im 26/35  brain]
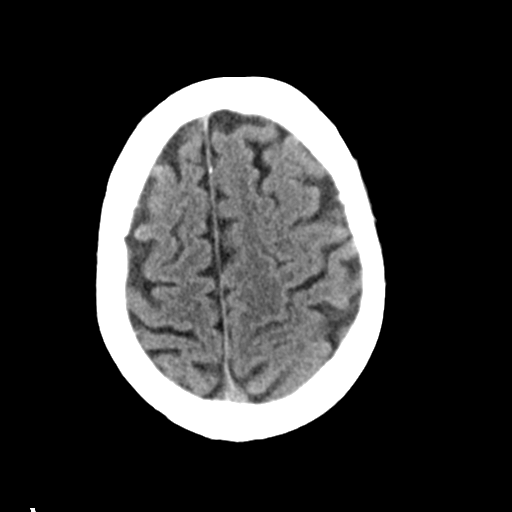
[im 30/35  brain]
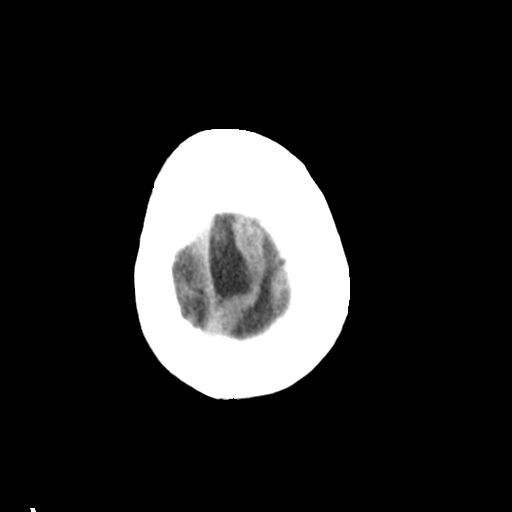

[Series 4: head bone · axial · 0.42mm/px · z∈[-65,-31]mm · 3 of 87 slices shown]
[im 9/87  bone]
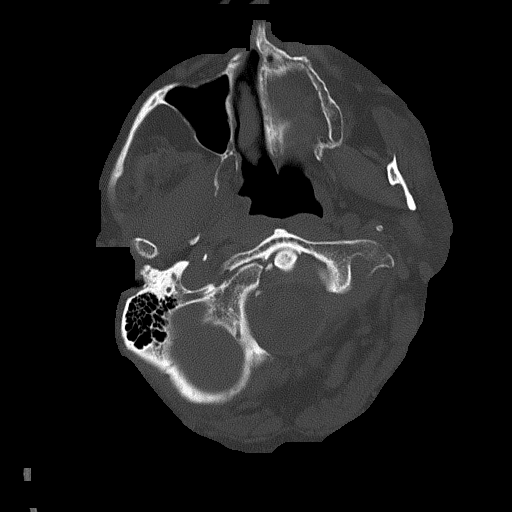
[im 18/87  bone]
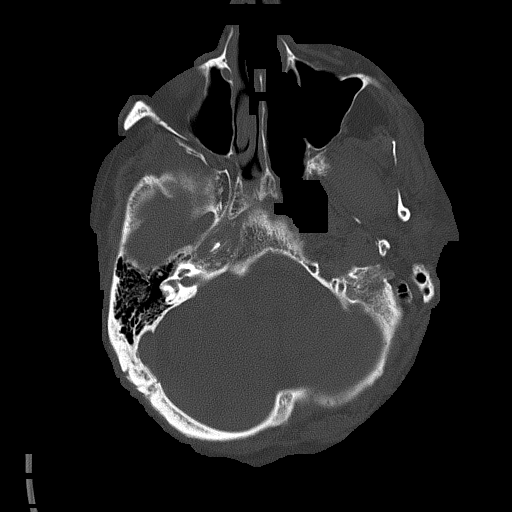
[im 26/87  bone]
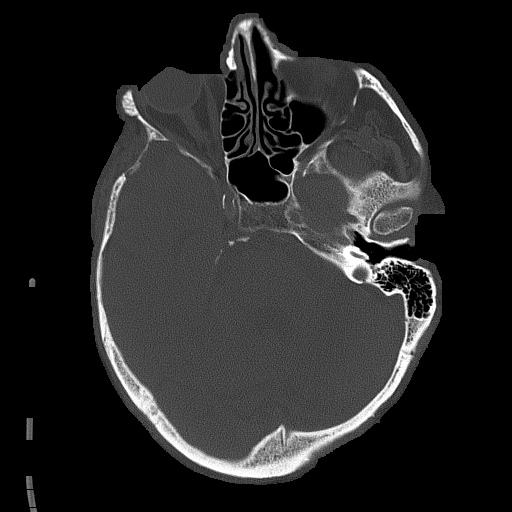

[Series 5: head without cor · coronal · non-contrast · 0.34mm/px · 3 of 67 slices shown]
[im 23/67  brain]
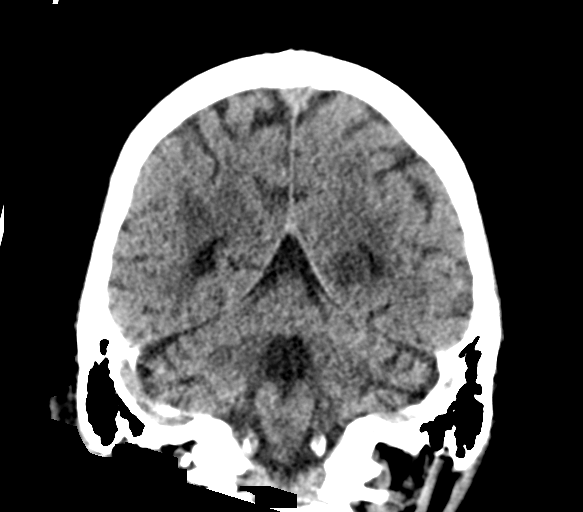
[im 30/67  brain]
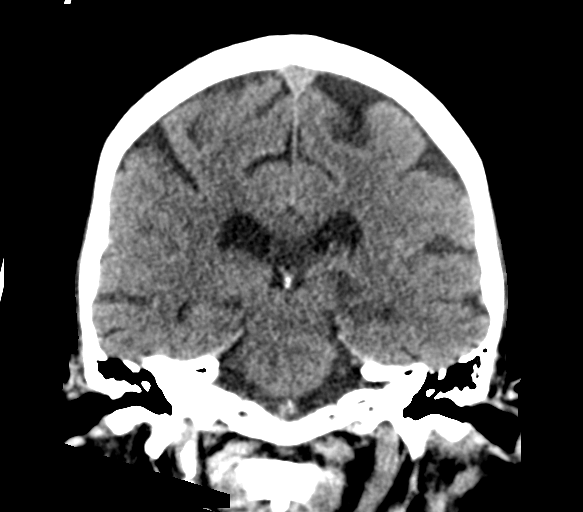
[im 37/67  brain]
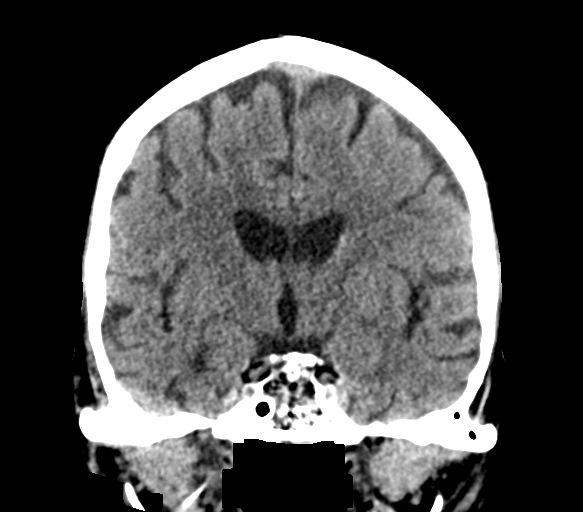

[Series 6: head without sag · sagittal · non-contrast · 0.32mm/px · 3 of 62 slices shown]
[im 28/62  brain]
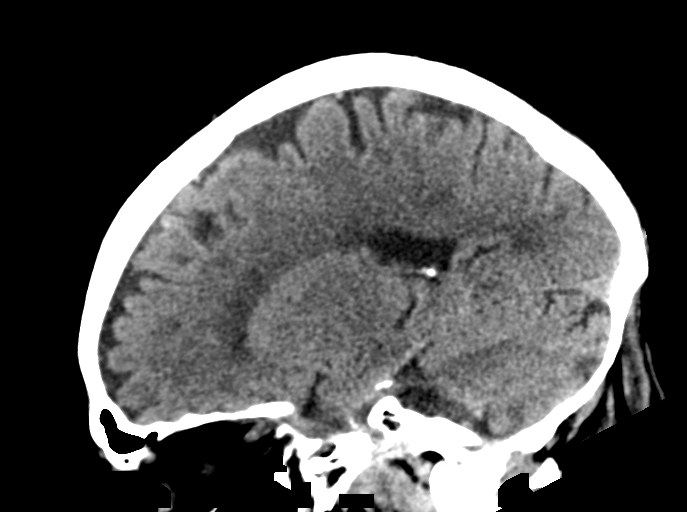
[im 31/62  brain]
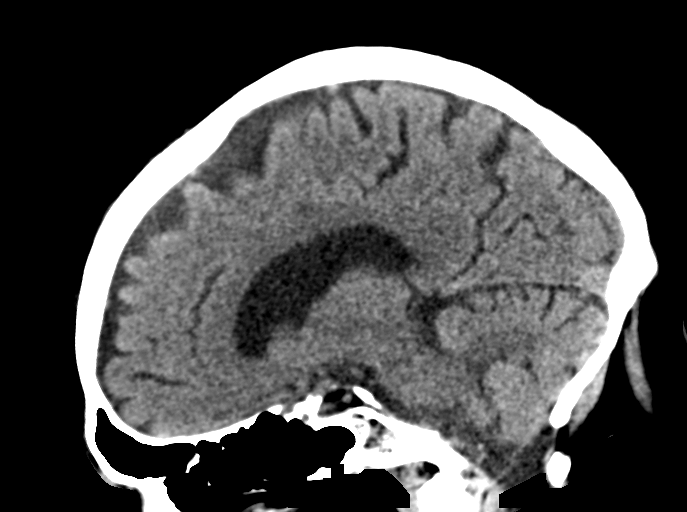
[im 35/62  brain]
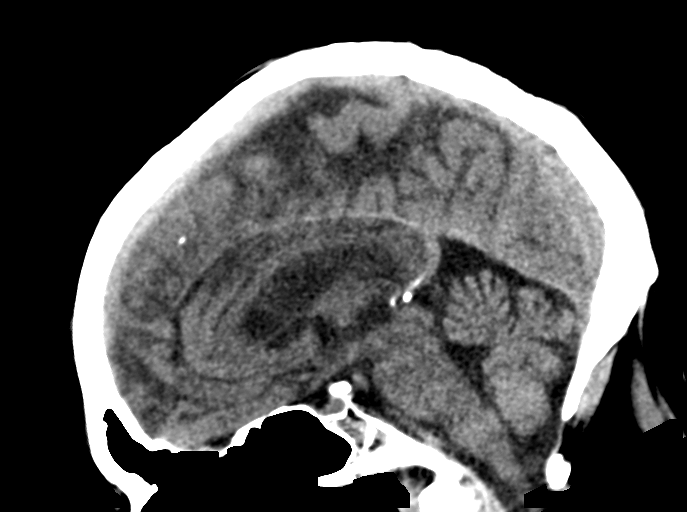

[16 of 47 positions shown; findings below may reference images not displayed]

FINDINGS: Brain: Confluent left PCA territory infarct is stable by CT since
yesterday. Small right PICA territory infarcts remain largely occult
by CT, but are identified now on coronal and sagittal images (series
5, image 55).

Stable gray-white matter differentiation elsewhere. Confluent
bilateral white matter hypodensity.

No midline shift, ventriculomegaly, mass effect, evidence of mass
lesion, intracranial hemorrhage or new cortically based infarction.

Vascular: Calcified atherosclerosis at the skull base. No suspicious
intracranial vascular hyperdensity.

Skull: Congenital incomplete ossification of the posterior C1 ring.
No acute osseous abnormality identified.

Sinuses/Orbits: Visualized paranasal sinuses and mastoids are stable
and well aerated.

Other: No acute orbit or scalp soft tissue finding.
IMPRESSION: 1. Stable confluent left PCA territory infarct. Increased
visualization of small right PICA infarcts by CT.
2. No associated hemorrhage or new intracranial abnormality.

## 2023-02-23 ENCOUNTER — Emergency Department (HOSPITAL_COMMUNITY)
Admission: EM | Admit: 2023-02-23 | Discharge: 2023-02-23 | Disposition: A | Discharge status: Home / Self Care | Payer: Medicare Other | Attending: Student | Admitting: Student

## 2023-02-23 ENCOUNTER — Encounter (HOSPITAL_COMMUNITY): Payer: Self-pay | Admitting: Emergency Medicine

## 2023-02-23 ENCOUNTER — Other Ambulatory Visit: Payer: Self-pay

## 2023-02-23 DIAGNOSIS — Z992 Dependence on renal dialysis: Secondary | ICD-10-CM | POA: Insufficient documentation

## 2023-02-23 DIAGNOSIS — Z7901 Long term (current) use of anticoagulants: Secondary | ICD-10-CM | POA: Insufficient documentation

## 2023-02-23 DIAGNOSIS — Z79899 Other long term (current) drug therapy: Secondary | ICD-10-CM | POA: Insufficient documentation

## 2023-02-23 DIAGNOSIS — J45909 Unspecified asthma, uncomplicated: Secondary | ICD-10-CM | POA: Insufficient documentation

## 2023-02-23 DIAGNOSIS — I120 Hypertensive chronic kidney disease with stage 5 chronic kidney disease or end stage renal disease: Secondary | ICD-10-CM | POA: Diagnosis not present

## 2023-02-23 DIAGNOSIS — T829XXA Unspecified complication of cardiac and vascular prosthetic device, implant and graft, initial encounter: Secondary | ICD-10-CM | POA: Diagnosis present

## 2023-02-23 DIAGNOSIS — N186 End stage renal disease: Secondary | ICD-10-CM | POA: Diagnosis not present

## 2023-02-23 DIAGNOSIS — Y732 Prosthetic and other implants, materials and accessory gastroenterology and urology devices associated with adverse incidents: Secondary | ICD-10-CM | POA: Insufficient documentation

## 2023-02-23 DIAGNOSIS — Z8546 Personal history of malignant neoplasm of prostate: Secondary | ICD-10-CM | POA: Diagnosis not present

## 2023-02-23 DIAGNOSIS — I4891 Unspecified atrial fibrillation: Secondary | ICD-10-CM | POA: Diagnosis not present

## 2023-02-23 LAB — CBC WITH DIFFERENTIAL/PLATELET
Abs Immature Granulocytes: 0 10*3/uL (ref 0.00–0.07)
Basophils Absolute: 0 10*3/uL (ref 0.0–0.1)
Basophils Relative: 1 %
Eosinophils Absolute: 0.1 10*3/uL (ref 0.0–0.5)
Eosinophils Relative: 3 %
HCT: 34.7 % — ABNORMAL LOW (ref 39.0–52.0)
Hemoglobin: 10.8 g/dL — ABNORMAL LOW (ref 13.0–17.0)
Immature Granulocytes: 0 %
Lymphocytes Relative: 24 %
Lymphs Abs: 1 10*3/uL (ref 0.7–4.0)
MCH: 31.4 pg (ref 26.0–34.0)
MCHC: 31.1 g/dL (ref 30.0–36.0)
MCV: 100.9 fL — ABNORMAL HIGH (ref 80.0–100.0)
Monocytes Absolute: 0.5 10*3/uL (ref 0.1–1.0)
Monocytes Relative: 11 %
Neutro Abs: 2.7 10*3/uL (ref 1.7–7.7)
Neutrophils Relative %: 61 %
Platelets: 78 10*3/uL — ABNORMAL LOW (ref 150–400)
RBC: 3.44 MIL/uL — ABNORMAL LOW (ref 4.22–5.81)
RDW: 16.6 % — ABNORMAL HIGH (ref 11.5–15.5)
Smear Review: DECREASED
WBC: 4.3 10*3/uL (ref 4.0–10.5)
nRBC: 0 % (ref 0.0–0.2)

## 2023-02-23 LAB — COMPREHENSIVE METABOLIC PANEL
ALT: 19 U/L (ref 0–44)
AST: 19 U/L (ref 15–41)
Albumin: 3.5 g/dL (ref 3.5–5.0)
Alkaline Phosphatase: 60 U/L (ref 38–126)
Anion gap: 11 (ref 5–15)
BUN: 15 mg/dL (ref 8–23)
CO2: 30 mmol/L (ref 22–32)
Calcium: 8.4 mg/dL — ABNORMAL LOW (ref 8.9–10.3)
Chloride: 95 mmol/L — ABNORMAL LOW (ref 98–111)
Creatinine, Ser: 3.43 mg/dL — ABNORMAL HIGH (ref 0.61–1.24)
GFR, Estimated: 17 mL/min — ABNORMAL LOW (ref >60)
Glucose, Bld: 93 mg/dL (ref 70–99)
Potassium: 3.5 mmol/L (ref 3.5–5.1)
Sodium: 136 mmol/L (ref 135–145)
Total Bilirubin: 0.7 mg/dL (ref 0.0–1.2)
Total Protein: 6.4 g/dL — ABNORMAL LOW (ref 6.5–8.1)

## 2023-02-23 LAB — RAPID HIV SCREEN (HIV 1/2 AB+AG)
HIV 1/2 Antibodies: NONREACTIVE
HIV-1 P24 Antigen - HIV24: NONREACTIVE

## 2023-02-23 LAB — PROTIME-INR
INR: 1.3 — ABNORMAL HIGH (ref 0.8–1.2)
Prothrombin Time: 16.3 s — ABNORMAL HIGH (ref 11.4–15.2)

## 2023-02-23 MED ORDER — LIDOCAINE HCL (PF) 2 % IJ SOLN
INTRAMUSCULAR | Status: AC
  Filled 2023-02-23: qty 5

## 2023-02-23 MED ORDER — LIDOCAINE-EPINEPHRINE (PF) 2 %-1:200000 IJ SOLN
INTRAMUSCULAR | Status: AC
  Filled 2023-02-23: qty 20

## 2023-02-23 NOTE — Discharge Instructions (Addendum)
You were seen in the emergency room for evaluation of a dialysis fistula bleed.  We were able to successfully stop the bleeding but it did require 3 stitches to be thrown.  Please call the vascular offices first thing in the morning to schedule outpatient follow-up.  It is okay to continue dialysis at a different site in the meantime.  Return to emergency department if bleeding persists.  Return to emergency room if you have any chest pain, shortness of breath, vomiting or other concerning symptoms

## 2023-02-23 NOTE — ED Triage Notes (Addendum)
EMS called out to dialysis for hemorrhage of left arm fistula site, pt arm was clapped for an hour before EMS was called. Pt continued to bleed until EMS arrived and re-clapped the area.

## 2023-02-23 NOTE — ED Provider Notes (Signed)
[place] EMERGENCY DEPARTMENT AT [facility] Provider Note  CSN: [id number] Arrival date & time: 02/23/23 1725  Chief Complaint(s) bleeding  HPI Taylor Willis is a 83 y.o. male with PMH ESRD on hemodialysis Tuesday Thursday Saturday, asthma, GERD, prostate cancer, A-fib on Eliquis who presents emergency department for evaluation of bleeding from his dialysis fistula.  Patient has been seen multiple times in the emergency department for bleeding from his dialysis fistula.  Patient arrives from his dialysis facility with clamp in place that have been present for an hour prior to EMS being called.  Patient continuing to have pulsatile bleeding at the site of dialysis insertion on arrival and is actively bleeding through his bandage.  Patient arrives alert and oriented answering all questions appropriately.  Denies chest pain, shortness of breath, abdominal pain, nausea, vomiting or other systemic symptoms.   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   BMI 31.0-31.9,adult 04/23/2020   ESRD (end stage renal disease) (HCC) 04/10/2020   Acute metabolic encephalopathy 04/10/2020   Hyponatremia 04/10/2020   Acute respiratory failure with hypoxia (HCC) 04/04/2020   Pulmonary edema 04/04/2020   GERD (gastroesophageal reflux disease) 04/04/2020   Prostate cancer (HCC) 04/04/2020   History of  asthma 04/04/2020   CKD (chronic kidney disease) stage 5, GFR less than 15 ml/min (HCC) 10/12/2019   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 Authorizing Provider  acetaminophen (TYLENOL) 500 MG tablet Take 500 mg by mouth every 6 (six) hours as needed for moderate pain.    [provider]  albuterol (PROVENTIL HFA;VENTOLIN HFA) 108 (90 BASE) MCG/ACT inhaler Inhale 2 puffs into the lungs every 6 (six) hours as needed for wheezing.    [provider]  allopurinol (ZYLOPRIM) 100 MG tablet Take 100 mg by mouth at bedtime. 12/04/19   [provider]  apixaban (ELIQUIS) 5 MG TABS tablet Take 1 tablet (5 mg total) by mouth 2 (two) times daily. Patient taking differently: Take 2.5 mg by mouth See admin instructions. Take 2.5 mg Tues.Ronda Fairly Sat, in the evening Take 2.5 mg on Monday, Wednesday, Friday and Sunday twice a day 07/07/20   Karsten Ro, MD  B Complex-C-Folic Acid (TRIPHROCAPS) 1 MG CAPS Take 1 capsule by mouth daily.    [provider]  calcitRIOL (  ROCALTROL) 0.25 MCG capsule Take 0.25 mcg by mouth Every Tuesday,Thursday,and Saturday with dialysis.    [provider]  carvedilol (COREG) 3.125 MG tablet Take 1 tablet (3.125 mg total) by mouth See admin instructions. Take 1 tablet in the evenings on Dialysis days (Tues, Thurs, and Sat), On Non-dialysis days take 1 tablet twice a day. Patient  taking differently: Take 3.125 mg by mouth every evening. 04/29/20   Shon Hale, MD  furosemide (LASIX) 40 MG tablet Take 120 mg by mouth daily. 07/17/21   [provider]  multivitamin (RENA-VIT) TABS tablet Take 1 tablet by mouth at bedtime. 07/25/21   [provider]  rosuvastatin (CRESTOR) 5 MG tablet Take 5 mg by mouth at bedtime.    [provider]  sertraline (ZOLOFT) 25 MG tablet Take 25 mg by mouth daily. 07/29/21   [provider]  sevelamer carbonate (RENVELA) 800 MG tablet Take 2,400 mg by mouth 2 (two) times daily. 09/19/22   [provider]  traZODone (DESYREL) 50 MG tablet Take 25 mg by mouth at bedtime.    [provider]  vitamin B-12 (CYANOCOBALAMIN) 1000 MCG tablet Take 1,000 mcg by mouth daily.    [provider]                                                                                                                                    Past Surgical History Past Surgical History:  Procedure Laterality Date   A/V FISTULAGRAM Left 10/09/2022   Procedure: A/V Fistulagram;  Surgeon: Leonie Douglas, MD;  Location: [facility] INVASIVE CV LAB;  Service: Cardiovascular;  Laterality: Left;   AV FISTULA PLACEMENT Left 02/15/2019   Procedure: ARTERIOVENOUS (AV) FISTULA CREATION LEFT ARM;  Surgeon: Larina Earthly, MD;  Location: [facility] OR;  Service: Vascular;  Laterality: Left;   CATARACT EXTRACTION W/ INTRAOCULAR LENS  IMPLANT, BILATERAL     CYSTOSCOPY/RETROGRADE/URETEROSCOPY/STONE EXTRACTION WITH BASKET Right 05/23/2012   Procedure: CYSTOSCOPY/RETROGRADE/URETEROSCOPY/STONE EXTRACTION WITH BASKET;  Surgeon: Valetta Fuller, MD;  Location: [facility];  Service: Urology;  Laterality: Right;   FRACTURE SURGERY Right    as a child   HERNIA REPAIR     HOLMIUM LASER APPLICATION Right 05/23/2012   Procedure: HOLMIUM LASER APPLICATION;  Surgeon: Valetta Fuller, MD;  Location: [facility];  Service: Urology;   Laterality: Right;   INSERTION OF DIALYSIS CATHETER Right 04/08/2020   Procedure: INSERTION OF TUNNELED DIALYSIS CATHETER;  Surgeon: Lucretia Roers, MD;  Location: [facility] ORS;  Service: General;  Laterality: Right;   LIGATION OF ARTERIOVENOUS  FISTULA Left 09/12/2019   Procedure: SIDE BRANCH LIGATION  OF LEFT ARM ARTERIOVENOUS FISTULA;  Surgeon: Sherren Kerns, MD;  Location: [facility] OR;  Service: Vascular;  Laterality: Left;   MULTIPLE TOOTH EXTRACTIONS     PERIPHERAL VASCULAR BALLOON ANGIOPLASTY  05/22/2020   Procedure: PERIPHERAL VASCULAR BALLOON ANGIOPLASTY;  Surgeon: Heath Lark  N, MD;  Location: [facility] INVASIVE CV LAB;  Service: Cardiovascular;;  Left arm fistula   PERIPHERAL VASCULAR BALLOON ANGIOPLASTY Left 10/09/2022   Procedure: PERIPHERAL VASCULAR BALLOON ANGIOPLASTY;  Surgeon: Leonie Douglas, MD;  Location: [facility] INVASIVE CV LAB;  Service: Cardiovascular;  Laterality: Left;   REVISON OF ARTERIOVENOUS FISTULA Left 09/12/2019   Procedure: REVISON OF LEFT ARM ARTERIOVENOUS FISTULA;  Surgeon: Sherren Kerns, MD;  Location: [facility] OR;  Service: Vascular;  Laterality: Left;   ROBOT ASSISTED LAPAROSCOPIC RADICAL PROSTATECTOMY  2011   TONSILLECTOMY     Family History History reviewed. No pertinent family history.  Social History Social History   Tobacco Use   Smoking status: Former    Current packs/day: 0.00    Types: Cigarettes    Quit date: 05/18/1982    Years since quitting: 40.7   Smokeless tobacco: Never  Vaping Use   Vaping status: Never Used  Substance Use Topics   Alcohol use: Not Currently    Alcohol/week: 1.0 standard drink of alcohol    Types: 1 Standard drinks or equivalent per week   Drug use: Never   Allergies Aspirin, Hydrochlorothiazide, Thiazide-type diuretics, and Ibuprofen  Review of Systems Review of Systems  All other systems reviewed and are negative.   Physical Exam Vital Signs  I have reviewed the triage vital signs BP (!) 155/68 (BP Location: Right Arm)    Pulse 60   Temp 98.2 F (36.8 C)   Resp 16   SpO2 97%   Physical Exam Constitutional:      General: He is not in acute distress.    Appearance: Normal appearance.  HENT:     Head: Normocephalic and atraumatic.     Nose: No congestion or rhinorrhea.  Eyes:     General:        Right eye: No discharge.        Left eye: No discharge.     Extraocular Movements: Extraocular movements intact.     Pupils: Pupils are equal, round, and reactive to light.  Cardiovascular:     Rate and Rhythm: Normal rate and regular rhythm.     Heart sounds: No murmur heard. Pulmonary:     Effort: No respiratory distress.     Breath sounds: No wheezing or rales.  Abdominal:     General: There is no distension.     Tenderness: There is no abdominal tenderness.  Musculoskeletal:        General: Normal range of motion.     Cervical back: Normal range of motion.     Comments: Pulsatile bleeding from left AV fistula  Skin:    General: Skin is warm and dry.  Neurological:     General: No focal deficit present.     Mental Status: He is alert.     ED Results and Treatments Labs (all labs ordered are listed, but only abnormal results are displayed) Labs Reviewed  COMPREHENSIVE METABOLIC PANEL - Abnormal; Notable for the following components:      Result Value   Chloride 95 (*)    Creatinine, Ser 3.43 (*)    Calcium 8.4 (*)    Total Protein 6.4 (*)    GFR, Estimated 17 (*)    All other components within normal limits  CBC WITH DIFFERENTIAL/PLATELET - Abnormal; Notable for the following components:   RBC 3.44 (*)    Hemoglobin 10.8 (*)    HCT 34.7 (*)    MCV 100.9 (*)    RDW 16.6 (*)  Platelets 78 (*)    All other components within normal limits  PROTIME-INR - Abnormal; Notable for the following components:   Prothrombin Time 16.3 (*)    INR 1.3 (*)    All other components within normal limits  RAPID HIV SCREEN (HIV 1/2 AB+AG)  HEPATITIS PANEL, ACUTE                                                                                                                           Radiology No results found.  Pertinent labs & imaging results that were available during my care of the patient were reviewed by me and considered in my medical decision making (see MDM for details).  Medications Ordered in ED Medications  lidocaine HCl (PF) (XYLOCAINE) 2 % injection (has no administration in time range)  lidocaine-EPINEPHrine (XYLOCAINE W/EPI) 2 %-1:200000 (PF) injection (has no administration in time range)                                                                                                                                     Procedures .Critical Care  Performed by: Glendora Score, MD Authorized by: Glendora Score, MD   Critical care provider statement:    Critical care time (minutes):  80   Critical care was necessary to treat or prevent imminent or life-threatening deterioration of the following conditions: Active arterial bleeding requiring multiple modalities to stop bleeding.   Critical care was time spent personally by me on the following activities:  Development of treatment plan with patient or surrogate, discussions with consultants, evaluation of patient's response to treatment, examination of patient, ordering and review of laboratory studies, ordering and review of radiographic studies, ordering and performing treatments and interventions, pulse oximetry, re-evaluation of patient's condition and review of old charts .Laceration Repair  Date/Time: 02/24/2023 1:12 PM  Performed by: Glendora Score, MD Authorized by: Glendora Score, MD   Anesthesia:    Anesthesia method:  Local infiltration   Local anesthetic:  Lidocaine 2% WITH epi Laceration details:    Location:  Shoulder/arm   Shoulder/arm location:  L upper arm   Length (cm):  0.5 Exploration:    Hemostasis achieved with:  Tourniquet and direct pressure Skin repair:    Repair method:  Sutures    Suture size:  4-0   Suture material:  Prolene   Suture technique:  Figure eight   Number of  sutures:  3 Approximation:    Approximation:  Close Repair type:    Repair type:  Simple Post-procedure details:    Dressing: pressure dressing.   Procedure completion:  Tolerated well, no immediate complications   (including critical care time)  Medical Decision Making / ED Course   This patient presents to the ED for concern of AV fistula bleed, this involves an extensive number of treatment options, and is a complaint that carries with it a high risk of complications and morbidity.  The differential diagnosis includes venous bleeding, arterial bleeding, coagulopathy, pseudoaneurysm  MDM: Seen emergency room for evaluation of an AV fistula bleed.  Physical exam with pulsatile bleeding from a single dialysis puncture site in the bicep on the left.  Multiple attempts at prolonged direct pressure were attempted using both gauze and hemostatic combat gauze.  Patient continued to have bleeding through multiple different compression dressings.  I personally held direct pressure for greater than 15 minutes with combat gauze in place and patient continued to have pulsatile bleeding.  I spoke with the vascular surgeon on-call Dr. Randie Heinz who is recommending placing figure-of-eight stitches for hemostasis.  Unfortunately we did not have a pneumatic tourniquet of appropriate size to place proximally and had to briefly use a CAT tourniquet to obtain a bloodless field and in order for me to place the appropriate stitches.  Tourniquet was placed as proximal as possible in the upper extremity and was on for less than 15 minutes.  Patient ultimately required 3 figure-of-eight stitches but hemostasis was ultimately achieved.  Hemoglobin is 10.8 with an MCV of 100.9 and INR 1.3.  Spoke again with Dr. Randie Heinz of vascular who will help arrange outpatient fistulogram.  With bleeding under control patient currently does not meet  inpatient criteria for admission and will be discharged with outpatient follow-up.   Additional history obtained: -Additional history obtained from son -External records from outside source obtained and reviewed including: Chart review including previous notes, labs, imaging, consultation notes   Lab Tests: -I ordered, reviewed, and interpreted labs.   The pertinent results include:   Labs Reviewed  COMPREHENSIVE METABOLIC PANEL - Abnormal; Notable for the following components:      Result Value   Chloride 95 (*)    Creatinine, Ser 3.43 (*)    Calcium 8.4 (*)    Total Protein 6.4 (*)    GFR, Estimated 17 (*)    All other components within normal limits  CBC WITH DIFFERENTIAL/PLATELET - Abnormal; Notable for the following components:   RBC 3.44 (*)    Hemoglobin 10.8 (*)    HCT 34.7 (*)    MCV 100.9 (*)    RDW 16.6 (*)    Platelets 78 (*)    All other components within normal limits  PROTIME-INR - Abnormal; Notable for the following components:   Prothrombin Time 16.3 (*)    INR 1.3 (*)    All other components within normal limits  RAPID HIV SCREEN (HIV 1/2 AB+AG)  HEPATITIS PANEL, ACUTE      EKG   EKG Interpretation Date/Time:  Tuesday February 23 2023 17:38:12 EST Ventricular Rate:  48 PR Interval:    QRS Duration:  112 QT Interval:  488 QTC Calculation: 436 R Axis:   -62  Text Interpretation: Atrial fibrillation Ventricular premature complex LAD, consider left anterior fascicular block Abnormal R-wave progression, late transition Borderline repolarization abnormality Confirmed by Lorre Nick ([id number]) on 02/24/2023 12:48:43 PM         Medicines  ordered and prescription drug management: Meds ordered this encounter  Medications   lidocaine HCl (PF) (XYLOCAINE) 2 % injection    Sadie Haber: cabinet override   lidocaine-EPINEPHrine (XYLOCAINE W/EPI) 2 %-1:200000 (PF) injection    Belton, Kristy A: cabinet override    -I have reviewed the patients home  medicines and have made adjustments as needed  Critical interventions Bleeding control, tourniquet placement, hemostatic gauze, stitches for hemostasis  Consultations Obtained: I requested consultation with the vascular surgeon on-call Dr. Randie Heinz,  and discussed lab and imaging findings as well as pertinent plan - they recommend: Figure-of-eight stitches, compression bandage, outpatient fistulogram   Cardiac Monitoring: The patient was maintained on a cardiac monitor.  I personally viewed and interpreted the cardiac monitored which showed an underlying rhythm of: A-fib  Social Determinants of Health:  Factors impacting patients care include: none   Reevaluation: After the interventions noted above, I reevaluated the patient and found that they have :improved  Co morbidities that complicate the patient evaluation  Past Medical History:  Diagnosis Date   Arthritis    gout   Asthma    has rescue inhaler   Cancer (HCC)    prostate   Chronic kidney disease    GERD (gastroesophageal reflux disease)    H/O peptic ulcer 1970's   Herpes 04/28/2020   Hyperlipidemia    Hypertension    Ureteral stone    Wears dentures    Wears glasses       Dispostion: I considered admission for this patient, but with hemostasis achieved she currently does not meet inpatient criteria for admission and will be discharged with outpatient follow-up     Final Clinical Impression(s) / ED Diagnoses Final diagnoses:  None     @PCDICTATION @    Glendora Score, MD 02/24/23 1315

## 2023-02-24 ENCOUNTER — Telehealth (HOSPITAL_COMMUNITY): Payer: Self-pay | Admitting: *Deleted

## 2023-02-24 LAB — HEPATITIS PANEL, ACUTE
HCV Ab: NONREACTIVE
Hep A IgM: NONREACTIVE
Hep B C IgM: NONREACTIVE
Hepatitis B Surface Ag: NONREACTIVE

## 2023-02-24 NOTE — Telephone Encounter (Signed)
Received fax from Dr Cherylann Ratel requesting evaluation due to bleeding from AVF and ED visit 11/14 and 11/21. Will give to [facility] and scan fax into media.

## 2023-02-25 ENCOUNTER — Telehealth (HOSPITAL_COMMUNITY): Payer: Self-pay

## 2023-02-25 ENCOUNTER — Other Ambulatory Visit: Payer: Self-pay

## 2023-02-25 ENCOUNTER — Encounter: Payer: Self-pay | Admitting: Emergency Medicine

## 2023-02-25 ENCOUNTER — Other Ambulatory Visit (HOSPITAL_COMMUNITY): Payer: Self-pay | Admitting: Nurse Practitioner

## 2023-02-25 ENCOUNTER — Observation Stay
Admission: EM | Admit: 2023-02-25 | Discharge: 2023-02-27 | Disposition: A | Discharge status: Home / Self Care | Payer: Medicare Other | Attending: Internal Medicine | Admitting: Internal Medicine

## 2023-02-25 DIAGNOSIS — Z7901 Long term (current) use of anticoagulants: Secondary | ICD-10-CM | POA: Insufficient documentation

## 2023-02-25 DIAGNOSIS — I120 Hypertensive chronic kidney disease with stage 5 chronic kidney disease or end stage renal disease: Secondary | ICD-10-CM | POA: Diagnosis not present

## 2023-02-25 DIAGNOSIS — I480 Paroxysmal atrial fibrillation: Secondary | ICD-10-CM | POA: Insufficient documentation

## 2023-02-25 DIAGNOSIS — Z79899 Other long term (current) drug therapy: Secondary | ICD-10-CM | POA: Insufficient documentation

## 2023-02-25 DIAGNOSIS — T82898A Other specified complication of vascular prosthetic devices, implants and grafts, initial encounter: Secondary | ICD-10-CM | POA: Diagnosis present

## 2023-02-25 DIAGNOSIS — E782 Mixed hyperlipidemia: Secondary | ICD-10-CM

## 2023-02-25 DIAGNOSIS — Z8546 Personal history of malignant neoplasm of prostate: Secondary | ICD-10-CM | POA: Insufficient documentation

## 2023-02-25 DIAGNOSIS — N186 End stage renal disease: Secondary | ICD-10-CM | POA: Diagnosis not present

## 2023-02-25 DIAGNOSIS — I482 Chronic atrial fibrillation, unspecified: Secondary | ICD-10-CM

## 2023-02-25 DIAGNOSIS — I10 Essential (primary) hypertension: Secondary | ICD-10-CM | POA: Diagnosis present

## 2023-02-25 DIAGNOSIS — T8249XA Other complication of vascular dialysis catheter, initial encounter: Principal | ICD-10-CM | POA: Insufficient documentation

## 2023-02-25 DIAGNOSIS — K219 Gastro-esophageal reflux disease without esophagitis: Secondary | ICD-10-CM | POA: Diagnosis not present

## 2023-02-25 DIAGNOSIS — Y828 Other medical devices associated with adverse incidents: Secondary | ICD-10-CM | POA: Diagnosis not present

## 2023-02-25 DIAGNOSIS — Z87891 Personal history of nicotine dependence: Secondary | ICD-10-CM | POA: Insufficient documentation

## 2023-02-25 DIAGNOSIS — J45909 Unspecified asthma, uncomplicated: Secondary | ICD-10-CM | POA: Diagnosis not present

## 2023-02-25 DIAGNOSIS — Z992 Dependence on renal dialysis: Secondary | ICD-10-CM

## 2023-02-25 DIAGNOSIS — Z789 Other specified health status: Principal | ICD-10-CM

## 2023-02-25 DIAGNOSIS — E785 Hyperlipidemia, unspecified: Secondary | ICD-10-CM | POA: Diagnosis not present

## 2023-02-25 LAB — COMPREHENSIVE METABOLIC PANEL
ALT: 22 U/L (ref 0–44)
AST: 28 U/L (ref 15–41)
Albumin: 3.4 g/dL — ABNORMAL LOW (ref 3.5–5.0)
Alkaline Phosphatase: 54 U/L (ref 38–126)
Anion gap: 15 (ref 5–15)
BUN: 32 mg/dL — ABNORMAL HIGH (ref 8–23)
CO2: 24 mmol/L (ref 22–32)
Calcium: 8.8 mg/dL — ABNORMAL LOW (ref 8.9–10.3)
Chloride: 100 mmol/L (ref 98–111)
Creatinine, Ser: 5.29 mg/dL — ABNORMAL HIGH (ref 0.61–1.24)
GFR, Estimated: 10 mL/min — ABNORMAL LOW (ref >60)
Glucose, Bld: 106 mg/dL — ABNORMAL HIGH (ref 70–99)
Potassium: 3.8 mmol/L (ref 3.5–5.1)
Sodium: 139 mmol/L (ref 135–145)
Total Bilirubin: 0.9 mg/dL (ref 0.0–1.2)
Total Protein: 6.5 g/dL (ref 6.5–8.1)

## 2023-02-25 LAB — CBC
HCT: 35.9 % — ABNORMAL LOW (ref 39.0–52.0)
Hemoglobin: 11 g/dL — ABNORMAL LOW (ref 13.0–17.0)
MCH: 31 pg (ref 26.0–34.0)
MCHC: 30.6 g/dL (ref 30.0–36.0)
MCV: 101.1 fL — ABNORMAL HIGH (ref 80.0–100.0)
Platelets: 72 10*3/uL — ABNORMAL LOW (ref 150–400)
RBC: 3.55 MIL/uL — ABNORMAL LOW (ref 4.22–5.81)
RDW: 16.5 % — ABNORMAL HIGH (ref 11.5–15.5)
WBC: 4.4 10*3/uL (ref 4.0–10.5)
nRBC: 0 % (ref 0.0–0.2)

## 2023-02-25 NOTE — ED Notes (Signed)
Pt resting in bed w/ equal rise and fall of chest noted,  call light within reach.

## 2023-02-25 NOTE — Telephone Encounter (Signed)
Called pt's wife to give instructions, no answer, left vm. AB

## 2023-02-25 NOTE — H&P (Signed)
History and Physical    Patient: Taylor Willis QMV:[id number] DOB: May 17, 1940 DOA: 02/25/2023 DOS: the patient was seen and examined on 02/25/2023 PCP: Donetta Potts, MD  Patient coming from: Home  Chief Complaint:  Chief Complaint  Patient presents with   Vascular Access Problem   HPI: Taylor Willis is a 83 y.o. male with medical history significant of hypertension, hyperlipidemia, atrial fibrillation, ESRD on hemodialysis on Tuesday, Thursday, Saturday with left arm AV fistula presented to the emergency department from dialysis unit as his site he is unable to be accessed.  Patient denies chest pain, shortness of breath, palpitations.  Patient seems to have mild cognitive dysfunction but able to answer me appropriately and slowly.  He denies fever, chills, rigors, abdominal pain, nausea or vomiting.  Patient has been having problems with AV fistula including bleeding and was at [facility] ED few days ago.  Multiple prior ED visits noted for similar issue. Given his vascular access problem nephrologist advised permacath placement and further vascular workup, advised him to come to the emergency department. ED provider discussed with vascular team who will schedule him for PermCath placement tomorrow.  Review of Systems: As mentioned in the history of present illness. All other systems reviewed and are negative. Past Medical History:  Diagnosis Date   Arthritis    gout   Asthma    has rescue inhaler   Cancer (HCC)    prostate   Chronic kidney disease    GERD (gastroesophageal reflux disease)    H/O peptic ulcer 1970's   Herpes 04/28/2020   Hyperlipidemia    Hypertension    Ureteral stone    Wears dentures    Wears glasses    Past Surgical History:  Procedure Laterality Date   A/V FISTULAGRAM Left 10/09/2022   Procedure: A/V Fistulagram;  Surgeon: Leonie Douglas, MD;  Location: [facility];  Service: Cardiovascular;  Laterality: Left;   AV FISTULA PLACEMENT Left  02/15/2019   Procedure: ARTERIOVENOUS (AV) FISTULA CREATION LEFT ARM;  Surgeon: Larina Earthly, MD;  Location: [facility] OR;  Service: Vascular;  Laterality: Left;   CATARACT EXTRACTION W/ INTRAOCULAR LENS  IMPLANT, BILATERAL     CYSTOSCOPY/RETROGRADE/URETEROSCOPY/STONE EXTRACTION WITH BASKET Right 05/23/2012   Procedure: CYSTOSCOPY/RETROGRADE/URETEROSCOPY/STONE EXTRACTION WITH BASKET;  Surgeon: Valetta Fuller, MD;  Location: [facility];  Service: Urology;  Laterality: Right;   FRACTURE SURGERY Right    as a child   HERNIA REPAIR     HOLMIUM LASER APPLICATION Right 05/23/2012   Procedure: HOLMIUM LASER APPLICATION;  Surgeon: Valetta Fuller, MD;  Location: [facility];  Service: Urology;  Laterality: Right;   INSERTION OF DIALYSIS CATHETER Right 04/08/2020   Procedure: INSERTION OF TUNNELED DIALYSIS CATHETER;  Surgeon: Lucretia Roers, MD;  Location: [facility];  Service: General;  Laterality: Right;   LIGATION OF ARTERIOVENOUS  FISTULA Left 09/12/2019   Procedure: SIDE BRANCH LIGATION  OF LEFT ARM ARTERIOVENOUS FISTULA;  Surgeon: Sherren Kerns, MD;  Location: [facility] OR;  Service: Vascular;  Laterality: Left;   MULTIPLE TOOTH EXTRACTIONS     PERIPHERAL VASCULAR BALLOON ANGIOPLASTY  05/22/2020   Procedure: PERIPHERAL VASCULAR BALLOON ANGIOPLASTY;  Surgeon: Leonie Douglas, MD;  Location: [facility];  Service: Cardiovascular;;  Left arm fistula   PERIPHERAL VASCULAR BALLOON ANGIOPLASTY Left 10/09/2022   Procedure: PERIPHERAL VASCULAR BALLOON ANGIOPLASTY;  Surgeon: Leonie Douglas, MD;  Location: [facility];  Service: Cardiovascular;  Laterality: Left;  REVISON OF ARTERIOVENOUS FISTULA Left 09/12/2019   Procedure: REVISON OF LEFT ARM ARTERIOVENOUS FISTULA;  Surgeon: Sherren Kerns, MD;  Location: [facility] OR;  Service: Vascular;  Laterality: Left;   ROBOT ASSISTED LAPAROSCOPIC RADICAL PROSTATECTOMY  2011   TONSILLECTOMY     Social History:  reports that he quit smoking  about 40 years ago. His smoking use included cigarettes. He has never used smokeless tobacco. He reports that he does not currently use alcohol after a past usage of about 1.0 standard drink of alcohol per week. He reports that he does not use drugs.  Allergies  Allergen Reactions   Aspirin Other (See Comments)    GI irritation    Hydrochlorothiazide     Dizziness and unable to walk   Thiazide-Type Diuretics Other (See Comments)    Dizziness and unable to walk   Ibuprofen Other (See Comments)    GI irritation     History reviewed. No pertinent family history.  Prior to Admission medications   Medication Sig Start Date End Date Taking? Authorizing Provider  acetaminophen (TYLENOL) 500 MG tablet Take 500 mg by mouth every 6 (six) hours as needed for moderate pain.    [provider]  albuterol (PROVENTIL HFA;VENTOLIN HFA) 108 (90 BASE) MCG/ACT inhaler Inhale 2 puffs into the lungs every 6 (six) hours as needed for wheezing.    [provider]  allopurinol (ZYLOPRIM) 100 MG tablet Take 100 mg by mouth at bedtime. 12/04/19   [provider]  apixaban (ELIQUIS) 5 MG TABS tablet Take 1 tablet (5 mg total) by mouth 2 (two) times daily. Patient taking differently: Take 2.5 mg by mouth See admin instructions. Take 2.5 mg Tues.Ronda Fairly Sat, in the evening Take 2.5 mg on Monday, Wednesday, Friday and Sunday twice a day 07/07/20   Karsten Ro, MD  B Complex-C-Folic Acid (TRIPHROCAPS) 1 MG CAPS Take 1 capsule by mouth daily.    [provider]  calcitRIOL (ROCALTROL) 0.25 MCG capsule Take 0.25 mcg by mouth Every Tuesday,Thursday,and Saturday with dialysis.    [provider]  carvedilol (COREG) 3.125 MG tablet Take 1 tablet (3.125 mg total) by mouth See admin instructions. Take 1 tablet in the evenings on Dialysis days (Tues, Thurs, and Sat), On Non-dialysis days take 1 tablet twice a day. Patient taking differently: Take 3.125 mg by mouth every  evening. 04/29/20   Shon Hale, MD  furosemide (LASIX) 40 MG tablet Take 120 mg by mouth daily. 07/17/21   [provider]  multivitamin (RENA-VIT) TABS tablet Take 1 tablet by mouth at bedtime. 07/25/21   [provider]  rosuvastatin (CRESTOR) 5 MG tablet Take 5 mg by mouth at bedtime.    [provider]  sertraline (ZOLOFT) 25 MG tablet Take 25 mg by mouth daily. 07/29/21   [provider]  sevelamer carbonate (RENVELA) 800 MG tablet Take 2,400 mg by mouth 2 (two) times daily. 09/19/22   [provider]  traZODone (DESYREL) 50 MG tablet Take 25 mg by mouth at bedtime.    [provider]  vitamin B-12 (CYANOCOBALAMIN) 1000 MCG tablet Take 1,000 mcg by mouth daily.    [provider]    Physical Exam: Vitals:   02/25/23 1337 02/25/23 1339  BP: (!) 158/73   Pulse: 93   Resp: 16   Temp: 97.6 F (36.4 C)   TempSrc: Oral   SpO2: 100%   Weight:  98.4 kg  Height:  6' (1.829 m)   General -  Elderly Caucasian male, no apparent distress HEENT - PERRLA, EOMI, atraumatic head, non tender sinuses. Lung - Clear, rales, rhonchi, wheezes. Heart - S1, S2 heard, no murmurs, rubs, 1+ pitting pedal edema. Abdomen-soft, nontender, distended, bowel sounds good Neuro - Alert, awake and oriented, slow mentation, non focal exam. Skin - Warm and dry.  Left arm AV fistula with dressing noted without bleeding. Data Reviewed:     Latest Ref Rng & Units 02/25/2023    1:41 PM 02/23/2023    7:18 PM 12/17/2022    4:12 PM  CBC  WBC 4.0 - 10.5 K/uL 4.4  4.3  4.1   Hemoglobin 13.0 - 17.0 g/dL 81.1  91.4  78.2   Hematocrit 39.0 - 52.0 % 35.9  34.7  33.4   Platelets 150 - 400 K/uL 72  78  104       Latest Ref Rng & Units 02/25/2023    1:41 PM 02/23/2023    7:18 PM 12/17/2022    4:12 PM  BMP  Glucose 70 - 99 mg/dL 956  93  213   BUN 8 - 23 mg/dL 32  15  11   Creatinine 0.61 - 1.24 mg/dL 0.86  5.78  4.69   Sodium 135 - 145 mmol/L 139  136  137    Potassium 3.5 - 5.1 mmol/L 3.8  3.5  3.5   Chloride 98 - 111 mmol/L 100  95  98   CO2 22 - 32 mmol/L 24  30  30    Calcium 8.9 - 10.3 mg/dL 8.8  8.4  8.2    No results found.   Assessment and Plan: KNIGHTON GEYMAN is a 83 y.o. male with medical history significant of hypertension, hyperlipidemia, atrial fibrillation, ESRD on hemodialysis on Tuesday, Thursday, Saturday with left arm AV fistula presented to the emergency department from dialysis unit as his site he is unable to be accessed.   Plan: Unable to access AV fistula. Admit to medicine, telemetry service. Vascular surgery to place permacath tomorrow. N.p.o. past midnight.  ESRD on hemodialysis: Nephrology evaluation for dialysis needs. Continue Renvela, Nephrocaps, Rocaltrol home dose.  Hypertension. BP stable.  Will resume home beta-blocker therapy.  Hyperlipidemia. Continue statin therapy.  Paroxysmal atrial fibrillation. Continue carvedilol, telemetry monitoring. Eliquis on hold due to vascular procedure tomorrow.  Fall, aspiration precautions. Encourage out of bed, incentive spirometry. Nursing supportive care. DVT prophylaxis-SCD given vascular procedure tomorrow.   Advance Care Planning:   Code Status: Full Code discussed with patient  Consults: Vascular, Nephrology  Severity of Illness: The appropriate patient status for this patient is OBSERVATION. Observation status is judged to be reasonable and necessary in order to provide the required intensity of service to ensure the patient's safety. The patient's presenting symptoms, physical exam findings, and initial radiographic and laboratory data in the context of their medical condition is felt to place them at decreased risk for further clinical deterioration. Furthermore, it is anticipated that the patient will be medically stable for discharge from the hospital within 2 midnights of admission.   Author: Marcelino Duster, MD 02/25/2023 5:38 PM  For on  call review [email or web address].

## 2023-02-25 NOTE — ED Triage Notes (Signed)
Pt here with a vascular issue. Pt states he went to dialysis yesterday and they were unable to use his catheter telling him that it would not flow. Pt denies any other issues.

## 2023-02-25 NOTE — ED Notes (Signed)
See triage note Presents from dialysis  States he went on Tuesday   and states area was bleeding  States he went to be seen  And had a suture placed  today they were not able to access the site

## 2023-02-25 NOTE — ED Provider Notes (Signed)
[facility] Provider Note    Event Date/Time   First MD Initiated Contact with Patient 02/25/23 1628     (approximate)   History   Vascular Access Problem   HPI  Taylor Willis is a 83 y.o. male with a history of ESRD on dialysis who presents due to a vascular access problem.  The patient is on a give any history other than stating that his doctor told him to come here.  He denies any bleeding or other acute complaints.  I reviewed the past medical records.  The patient was seen at the [facility] ED on 1/21 for bleeding from his dialysis fistula that required a suture.   Physical Exam   Triage Vital Signs: ED Triage Vitals  Encounter Vitals Group     BP 02/25/23 1337 (!) 158/73     Systolic BP Percentile --      Diastolic BP Percentile --      Pulse Rate 02/25/23 1337 93     Resp 02/25/23 1337 16     Temp 02/25/23 1337 97.6 F (36.4 C)     Temp Source 02/25/23 1337 Oral     SpO2 02/25/23 1337 100 %     Weight 02/25/23 1339 216 lb 14.9 oz (98.4 kg)     Height 02/25/23 1339 6' (1.829 m)     Head Circumference --      Peak Flow --      Pain Score 02/25/23 1339 0     Pain Loc --      Pain Education --      Exclude from Growth Chart --     Most recent vital signs: Vitals:   02/25/23 1337  BP: (!) 158/73  Pulse: 93  Resp: 16  Temp: 97.6 F (36.4 C)  SpO2: 100%     General: Awake, no distress.  CV:  Good peripheral perfusion.  Resp:  Normal effort.  Abd:  No distention.  Other:  Left arm fistula with bandage in place.  No bleeding through the bandage.   ED Results / Procedures / Treatments   Labs (all labs ordered are listed, but only abnormal results are displayed) Labs Reviewed  CBC - Abnormal; Notable for the following components:      Result Value   RBC 3.55 (*)    Hemoglobin 11.0 (*)    HCT 35.9 (*)    MCV 101.1 (*)    RDW 16.5 (*)    Platelets 72 (*)    All other components within normal limits  COMPREHENSIVE  METABOLIC PANEL - Abnormal; Notable for the following components:   Glucose, Bld 106 (*)    BUN 32 (*)    Creatinine, Ser 5.29 (*)    Calcium 8.8 (*)    Albumin 3.4 (*)    GFR, Estimated 10 (*)    All other components within normal limits     EKG   RADIOLOGY   PROCEDURES:  Critical Care performed: No  Procedures   MEDICATIONS ORDERED IN ED: Medications - No data to display   IMPRESSION / MDM / ASSESSMENT AND PLAN / ED COURSE  I reviewed the triage vital signs and the nursing notes.  83 year old male with PMH as noted above presents with a problem with his dialysis fistula.  The patient is unable to give any specific history.  He was just seen at the [facility] ED a few days ago for acute bleeding.  Differential diagnosis includes, but is not  limited to, vascular access problem.  Patient's presentation is most consistent with acute presentation with potential threat to life or bodily function.  I consulted and discussed the case with Dr. Cherylann Ratel from nephrology who advised that the patient needs inpatient admission for permacath placement, further vascular workup, and for dialysis since he missed his sessions 2 days ago and today.    BMP and CBC show no acute findings.  ----------------------------------------- 5:26 PM on 02/25/2023 -----------------------------------------  I consulted the hospitalist; based on our discussion he agrees to evaluate the patient for admission.   FINAL CLINICAL IMPRESSION(S) / ED DIAGNOSES   Final diagnoses:  Problem with vascular access     Rx / DC Orders   ED Discharge Orders     None        Note:  This document was prepared using Dragon voice recognition software and may include unintentional dictation errors.    Dionne Bucy, MD 02/25/23 [phone]

## 2023-02-26 ENCOUNTER — Encounter
Admission: EM | Disposition: A | Discharge status: Home / Self Care | Payer: Self-pay | Source: Home / Self Care | Attending: Emergency Medicine

## 2023-02-26 ENCOUNTER — Encounter: Payer: Self-pay | Admitting: Internal Medicine

## 2023-02-26 ENCOUNTER — Encounter (HOSPITAL_COMMUNITY): Payer: Self-pay

## 2023-02-26 ENCOUNTER — Ambulatory Visit (HOSPITAL_COMMUNITY): Admission: RE | Admit: 2023-02-26 | Payer: Medicare Other | Source: Ambulatory Visit

## 2023-02-26 DIAGNOSIS — T8249XA Other complication of vascular dialysis catheter, initial encounter: Secondary | ICD-10-CM | POA: Diagnosis not present

## 2023-02-26 DIAGNOSIS — N186 End stage renal disease: Secondary | ICD-10-CM | POA: Diagnosis not present

## 2023-02-26 DIAGNOSIS — I480 Paroxysmal atrial fibrillation: Secondary | ICD-10-CM

## 2023-02-26 DIAGNOSIS — T82898A Other specified complication of vascular prosthetic devices, implants and grafts, initial encounter: Secondary | ICD-10-CM | POA: Diagnosis not present

## 2023-02-26 DIAGNOSIS — I10 Essential (primary) hypertension: Secondary | ICD-10-CM | POA: Diagnosis not present

## 2023-02-26 DIAGNOSIS — T82858A Stenosis of vascular prosthetic devices, implants and grafts, initial encounter: Secondary | ICD-10-CM

## 2023-02-26 DIAGNOSIS — E782 Mixed hyperlipidemia: Secondary | ICD-10-CM | POA: Diagnosis not present

## 2023-02-26 DIAGNOSIS — Z992 Dependence on renal dialysis: Secondary | ICD-10-CM | POA: Diagnosis not present

## 2023-02-26 HISTORY — PX: DIALYSIS/PERMA CATHETER INSERTION: CATH118288

## 2023-02-26 HISTORY — PX: A/V FISTULAGRAM: CATH118298

## 2023-02-26 LAB — MRSA NEXT GEN BY PCR, NASAL: MRSA by PCR Next Gen: NOT DETECTED

## 2023-02-26 SURGERY — A/V FISTULAGRAM
Anesthesia: Moderate Sedation

## 2023-02-26 MED ORDER — CALCITRIOL 0.25 MCG PO CAPS
0.2500 ug | ORAL_CAPSULE | ORAL | Status: DC
  Administered 2023-02-27: 0.25 ug via ORAL
  Filled 2023-02-26 (×2): qty 1

## 2023-02-26 MED ORDER — HEPARIN SODIUM (PORCINE) 1000 UNIT/ML IJ SOLN
INTRAMUSCULAR | Status: AC
  Filled 2023-02-26: qty 10

## 2023-02-26 MED ORDER — METHYLPREDNISOLONE SODIUM SUCC 125 MG IJ SOLR: 125.0000 mg | Freq: Once | INTRAMUSCULAR | Status: DC | PRN

## 2023-02-26 MED ORDER — ACETAMINOPHEN 650 MG RE SUPP
650.0000 mg | Freq: Four times a day (QID) | RECTAL | Status: DC | PRN
Start: 2023-02-26 — End: 2023-02-27

## 2023-02-26 MED ORDER — ALBUTEROL SULFATE HFA 108 (90 BASE) MCG/ACT IN AERS: 2.0000 | INHALATION_SPRAY | Freq: Four times a day (QID) | RESPIRATORY_TRACT | Status: DC | PRN

## 2023-02-26 MED ORDER — FE FUM-VIT C-VIT B12-FA 460-60-0.01-1 MG PO CAPS
1.0000 | ORAL_CAPSULE | Freq: Every day | ORAL | Status: DC
  Administered 2023-02-26: 1 via ORAL
  Filled 2023-02-26 (×2): qty 1

## 2023-02-26 MED ORDER — CHLORHEXIDINE GLUCONATE CLOTH 2 % EX PADS: 6.0000 | MEDICATED_PAD | Freq: Every day | CUTANEOUS | Status: DC

## 2023-02-26 MED ORDER — IODIXANOL 320 MG/ML IV SOLN
INTRAVENOUS | Status: DC | PRN
  Administered 2023-02-26: 20 mL via INTRAVENOUS

## 2023-02-26 MED ORDER — MIDAZOLAM HCL 2 MG/2ML IJ SOLN
INTRAMUSCULAR | Status: DC | PRN
  Administered 2023-02-26 (×2): .5 mg via INTRAVENOUS
  Administered 2023-02-26: 1 mg via INTRAVENOUS

## 2023-02-26 MED ORDER — HEPARIN SODIUM (PORCINE) 10000 UNIT/ML IJ SOLN
INTRAMUSCULAR | Status: DC | PRN
  Administered 2023-02-26: 10000 [IU]

## 2023-02-26 MED ORDER — CEFAZOLIN SODIUM-DEXTROSE 1-4 GM/50ML-% IV SOLN
INTRAVENOUS | Status: AC
  Filled 2023-02-26: qty 50

## 2023-02-26 MED ORDER — ACETAMINOPHEN 325 MG PO TABS
650.0000 mg | ORAL_TABLET | Freq: Four times a day (QID) | ORAL | Status: DC | PRN
  Filled 2023-02-26: qty 2

## 2023-02-26 MED ORDER — RENA-VITE PO TABS
1.0000 | ORAL_TABLET | Freq: Every day | ORAL | Status: DC
  Administered 2023-02-26: 1 via ORAL
  Filled 2023-02-26: qty 1

## 2023-02-26 MED ORDER — SEVELAMER CARBONATE 800 MG PO TABS
2400.0000 mg | ORAL_TABLET | Freq: Two times a day (BID) | ORAL | Status: DC
Start: 2023-02-26 — End: 2023-02-27
  Administered 2023-02-27: 2400 mg via ORAL
  Filled 2023-02-26 (×3): qty 3

## 2023-02-26 MED ORDER — SODIUM CHLORIDE 0.9 % IV SOLN: INTRAVENOUS | Status: DC

## 2023-02-26 MED ORDER — FENTANYL CITRATE (PF) 100 MCG/2ML IJ SOLN
INTRAMUSCULAR | Status: DC | PRN
  Administered 2023-02-26: 18.75 ug via INTRAVENOUS
  Administered 2023-02-26: 6.25 ug via INTRAVENOUS
  Administered 2023-02-26 (×2): 12.5 ug via INTRAVENOUS

## 2023-02-26 MED ORDER — CARVEDILOL 6.25 MG PO TABS
3.1250 mg | ORAL_TABLET | ORAL | Status: DC
  Administered 2023-02-26: 3.125 mg via ORAL
  Filled 2023-02-26: qty 1

## 2023-02-26 MED ORDER — LIDOCAINE-EPINEPHRINE (PF) 1 %-1:200000 IJ SOLN
INTRAMUSCULAR | Status: DC | PRN
  Administered 2023-02-26: 20 mL via INTRADERMAL
  Administered 2023-02-26: 5 mL via INTRADERMAL

## 2023-02-26 MED ORDER — MIDAZOLAM HCL 2 MG/ML PO SYRP
8.0000 mg | ORAL_SOLUTION | Freq: Once | ORAL | Status: DC | PRN
  Filled 2023-02-26: qty 5

## 2023-02-26 MED ORDER — VITAMIN B-12 1000 MCG PO TABS
1000.0000 ug | ORAL_TABLET | Freq: Every day | ORAL | Status: DC
Start: 2023-02-26 — End: 2023-02-27
  Administered 2023-02-26 – 2023-02-27 (×2): 1000 ug via ORAL
  Filled 2023-02-26: qty 1
  Filled 2023-02-26: qty 2

## 2023-02-26 MED ORDER — FENTANYL CITRATE PF 50 MCG/ML IJ SOSY: 12.5000 ug | PREFILLED_SYRINGE | Freq: Once | INTRAMUSCULAR | Status: DC | PRN

## 2023-02-26 MED ORDER — CARVEDILOL 3.125 MG PO TABS: 3.1250 mg | ORAL_TABLET | ORAL | Status: DC

## 2023-02-26 MED ORDER — HEPARIN (PORCINE) IN NACL 1000-0.9 UT/500ML-% IV SOLN
INTRAVENOUS | Status: DC | PRN
  Administered 2023-02-26: 500 mL

## 2023-02-26 MED ORDER — SENNA 8.6 MG PO TABS
1.0000 | ORAL_TABLET | Freq: Two times a day (BID) | ORAL | Status: DC
  Administered 2023-02-26 (×2): 8.6 mg via ORAL
  Filled 2023-02-26 (×2): qty 1

## 2023-02-26 MED ORDER — DIPHENHYDRAMINE HCL 50 MG/ML IJ SOLN: 50.0000 mg | Freq: Once | INTRAMUSCULAR | Status: DC | PRN

## 2023-02-26 MED ORDER — HYDRALAZINE HCL 20 MG/ML IJ SOLN: 10.0000 mg | Freq: Four times a day (QID) | INTRAMUSCULAR | Status: DC | PRN

## 2023-02-26 MED ORDER — FAMOTIDINE 20 MG PO TABS: 40.0000 mg | ORAL_TABLET | Freq: Once | ORAL | Status: DC | PRN

## 2023-02-26 MED ORDER — SERTRALINE HCL 50 MG PO TABS
25.0000 mg | ORAL_TABLET | Freq: Every day | ORAL | Status: DC
  Administered 2023-02-26 – 2023-02-27 (×2): 25 mg via ORAL
  Filled 2023-02-26 (×2): qty 1

## 2023-02-26 MED ORDER — ALBUTEROL SULFATE (2.5 MG/3ML) 0.083% IN NEBU: 2.5000 mg | INHALATION_SOLUTION | Freq: Four times a day (QID) | RESPIRATORY_TRACT | Status: DC | PRN

## 2023-02-26 MED ORDER — CEFAZOLIN SODIUM-DEXTROSE 1-4 GM/50ML-% IV SOLN
1.0000 g | INTRAVENOUS | Status: AC
  Administered 2023-02-26: 1 g via INTRAVENOUS
  Filled 2023-02-26: qty 50

## 2023-02-26 MED ORDER — DOCUSATE SODIUM 100 MG PO CAPS
100.0000 mg | ORAL_CAPSULE | Freq: Two times a day (BID) | ORAL | Status: DC
  Administered 2023-02-26 (×2): 100 mg via ORAL
  Filled 2023-02-26 (×2): qty 1

## 2023-02-26 MED ORDER — MIDAZOLAM HCL 5 MG/5ML IJ SOLN
INTRAMUSCULAR | Status: AC
  Filled 2023-02-26: qty 5

## 2023-02-26 MED ORDER — CARVEDILOL 6.25 MG PO TABS: 3.1250 mg | ORAL_TABLET | ORAL | Status: DC

## 2023-02-26 MED ORDER — MIDAZOLAM HCL 2 MG/2ML IJ SOLN
INTRAMUSCULAR | Status: AC
Start: 2023-02-26 — End: ?
  Filled 2023-02-26: qty 2

## 2023-02-26 MED ORDER — FUROSEMIDE 40 MG PO TABS
120.0000 mg | ORAL_TABLET | Freq: Every day | ORAL | Status: DC
  Administered 2023-02-26: 120 mg via ORAL
  Filled 2023-02-26: qty 3

## 2023-02-26 MED ORDER — FENTANYL CITRATE (PF) 100 MCG/2ML IJ SOLN
INTRAMUSCULAR | Status: AC
  Filled 2023-02-26: qty 2

## 2023-02-26 MED ORDER — TRAZODONE HCL 50 MG PO TABS
25.0000 mg | ORAL_TABLET | Freq: Every day | ORAL | Status: DC
  Administered 2023-02-26: 25 mg via ORAL
  Filled 2023-02-26: qty 1

## 2023-02-26 MED ORDER — ROSUVASTATIN CALCIUM 10 MG PO TABS
5.0000 mg | ORAL_TABLET | Freq: Every day | ORAL | Status: DC
Start: 2023-02-26 — End: 2023-02-27
  Administered 2023-02-26: 5 mg via ORAL
  Filled 2023-02-26: qty 1

## 2023-02-26 MED ORDER — FENTANYL CITRATE PF 50 MCG/ML IJ SOSY
PREFILLED_SYRINGE | INTRAMUSCULAR | Status: AC
  Filled 2023-02-26: qty 1

## 2023-02-26 SURGICAL SUPPLY — 24 items
BALLN DORADO 8X60X80 (BALLOONS) ×1 IMPLANT
BALLOON DORADO 8X60X80 (BALLOONS) IMPLANT
BIOPATCH RED 1 DISK 7.0 (GAUZE/BANDAGES/DRESSINGS) IMPLANT
CATH BEACON 5 .035 65 KMP TIP (CATHETERS) IMPLANT
CATH CANNON HEMO 15FR 19 (HEMODIALYSIS SUPPLIES) IMPLANT
COVER PROBE ULTRASOUND 5X96 (MISCELLANEOUS) IMPLANT
DERMABOND ADVANCED .7 DNX12 (GAUZE/BANDAGES/DRESSINGS) IMPLANT
DEVICE PRESTO INFLATION (MISCELLANEOUS) IMPLANT
DRAPE BRACHIAL (DRAPES) IMPLANT
DRAPE INCISE IOBAN 66X45 STRL (DRAPES) IMPLANT
GOWN STRL REUS W/ TWL LRG LVL3 (GOWN DISPOSABLE) ×1 IMPLANT
GUIDEWIRE SUPER STIFF .035X180 (WIRE) IMPLANT
NDL ENTRY 21GA 7CM ECHOTIP (NEEDLE) IMPLANT
NEEDLE ENTRY 21GA 7CM ECHOTIP (NEEDLE) ×1 IMPLANT
PACK ANGIOGRAPHY (CUSTOM PROCEDURE TRAY) ×1 IMPLANT
SET INTRO CAPELLA COAXIAL (SET/KITS/TRAYS/PACK) IMPLANT
SHEATH BRITE TIP 6FRX5.5 (SHEATH) IMPLANT
SHEATH BRITE TIP 7FRX5.5 (SHEATH) IMPLANT
STENT VIABAHN 8X10X120 (Permanent Stent) IMPLANT
STENT VIABAHN 8X2.5X120 (Permanent Stent) IMPLANT
SUT MNCRL AB 4-0 PS2 18 (SUTURE) IMPLANT
SUT SILK 0 FSL (SUTURE) IMPLANT
WIRE G 018X200 V18 (WIRE) IMPLANT
WIRE GUIDERIGHT .035X150 (WIRE) IMPLANT

## 2023-02-26 NOTE — Consult Note (Signed)
Hospital Consult    Reason for Consult:  Bleeding left Upper Extremity A/V Fistula Requesting Physician:  Marcelino Duster MD  MRN #:  [id number]  History of Present Illness: This is a 83 y.o. male with medical history significant of hypertension, hyperlipidemia, atrial fibrillation, ESRD on hemodialysis on Tuesday, Thursday, Saturday with left arm AV fistula presented to the emergency department from dialysis unit as his site he is unable to be accessed.   On exam today the patient is resting comfortably in bed in the emergency department.  Patient endorses that they are able to access his AV fistula but he had bleeding afterwards that was concerning.  His left upper arm AV fistula has a strong pulse and a thrill.  His hand is warm with palpable radial pulses.  He has good capillary refill.  No complaints overnight vitals all remained stable.  Past Medical History:  Diagnosis Date   Arthritis    gout   Asthma    has rescue inhaler   Cancer (HCC)    prostate   Chronic kidney disease    GERD (gastroesophageal reflux disease)    H/O peptic ulcer 1970's   Herpes 04/28/2020   Hyperlipidemia    Hypertension    Ureteral stone    Wears dentures    Wears glasses     Past Surgical History:  Procedure Laterality Date   A/V FISTULAGRAM Left 10/09/2022   Procedure: A/V Fistulagram;  Surgeon: Leonie Douglas, MD;  Location: [facility];  Service: Cardiovascular;  Laterality: Left;   AV FISTULA PLACEMENT Left 02/15/2019   Procedure: ARTERIOVENOUS (AV) FISTULA CREATION LEFT ARM;  Surgeon: Larina Earthly, MD;  Location: [facility];  Service: Vascular;  Laterality: Left;   CATARACT EXTRACTION W/ INTRAOCULAR LENS  IMPLANT, BILATERAL     CYSTOSCOPY/RETROGRADE/URETEROSCOPY/STONE EXTRACTION WITH BASKET Right 05/23/2012   Procedure: CYSTOSCOPY/RETROGRADE/URETEROSCOPY/STONE EXTRACTION WITH BASKET;  Surgeon: Valetta Fuller, MD;  Location: [facility];  Service: Urology;  Laterality:  Right;   FRACTURE SURGERY Right    as a child   HERNIA REPAIR     HOLMIUM LASER APPLICATION Right 05/23/2012   Procedure: HOLMIUM LASER APPLICATION;  Surgeon: Valetta Fuller, MD;  Location: [facility];  Service: Urology;  Laterality: Right;   INSERTION OF DIALYSIS CATHETER Right 04/08/2020   Procedure: INSERTION OF TUNNELED DIALYSIS CATHETER;  Surgeon: Lucretia Roers, MD;  Location: [facility];  Service: General;  Laterality: Right;   LIGATION OF ARTERIOVENOUS  FISTULA Left 09/12/2019   Procedure: SIDE BRANCH LIGATION  OF LEFT ARM ARTERIOVENOUS FISTULA;  Surgeon: Sherren Kerns, MD;  Location: [facility] OR;  Service: Vascular;  Laterality: Left;   MULTIPLE TOOTH EXTRACTIONS     PERIPHERAL VASCULAR BALLOON ANGIOPLASTY  05/22/2020   Procedure: PERIPHERAL VASCULAR BALLOON ANGIOPLASTY;  Surgeon: Leonie Douglas, MD;  Location: [facility];  Service: Cardiovascular;;  Left arm fistula   PERIPHERAL VASCULAR BALLOON ANGIOPLASTY Left 10/09/2022   Procedure: PERIPHERAL VASCULAR BALLOON ANGIOPLASTY;  Surgeon: Leonie Douglas, MD;  [facility]: [facility];  Service: Cardiovascular;  Laterality: Left;   REVISON OF ARTERIOVENOUS FISTULA Left 09/12/2019   Procedure: REVISON OF LEFT ARM ARTERIOVENOUS FISTULA;  Surgeon: Sherren Kerns, MD;  Location: [facility];  Service: Vascular;  Laterality: Left;   ROBOT ASSISTED LAPAROSCOPIC RADICAL PROSTATECTOMY  2011   TONSILLECTOMY      Allergies  Allergen Reactions   Aspirin Other (See Comments)    GI irritation  Hydrochlorothiazide     Dizziness and unable to walk   Thiazide-Type Diuretics Other (See Comments)    Dizziness and unable to walk   Ibuprofen Other (See Comments)    GI irritation     Prior to Admission medications   Medication Sig Start Date End Date Taking? Authorizing Provider  acetaminophen (TYLENOL) 500 MG tablet Take 500 mg by mouth every 6 (six) hours as needed for moderate pain.   Yes [provider]   albuterol (PROVENTIL HFA;VENTOLIN HFA) 108 (90 BASE) MCG/ACT inhaler Inhale 2 puffs into the lungs every 6 (six) hours as needed for wheezing.   Yes [provider]  allopurinol (ZYLOPRIM) 100 MG tablet Take 100 mg by mouth at bedtime. 12/04/19  Yes [provider]  apixaban (ELIQUIS) 5 MG TABS tablet Take 1 tablet (5 mg total) by mouth 2 (two) times daily. Patient taking differently: Take 2.5 mg by mouth See admin instructions. Take 2.5 mg Tues., Thurs.,and Sat, in the evening Take 2.5 mg on Monday, Wednesday, Friday and Sunday twice a day 07/07/20  Yes Doda, Traci Sermon, MD  B Complex-C-Folic Acid (TRIPHROCAPS) 1 MG CAPS Take 1 capsule by mouth daily.   Yes [provider]  calcitRIOL (ROCALTROL) 0.25 MCG capsule Take 0.25 mcg by mouth Every Tuesday,Thursday,and Saturday with dialysis.   Yes [provider]  carvedilol (COREG) 3.125 MG tablet Take 1 tablet (3.125 mg total) by mouth See admin instructions. Take 1 tablet in the evenings on Dialysis days (Tues, Thurs, and Sat), On Non-dialysis days take 1 tablet twice a day. Patient taking differently: Take 3.125 mg by mouth every evening. 04/29/20  Yes Emokpae, Courage, MD  furosemide (LASIX) 40 MG tablet Take 120 mg by mouth daily. 07/17/21  Yes [provider]  multivitamin (RENA-VIT) TABS tablet Take 1 tablet by mouth at bedtime. 07/25/21  Yes [provider]  rosuvastatin (CRESTOR) 5 MG tablet Take 5 mg by mouth at bedtime.   Yes [provider]  sertraline (ZOLOFT) 25 MG tablet Take 25 mg by mouth daily. 07/29/21  Yes [provider]  sevelamer carbonate (RENVELA) 800 MG tablet Take 2,400 mg by mouth 2 (two) times daily. 09/19/22  Yes [provider]  traZODone (DESYREL) 50 MG tablet Take 25 mg by mouth at bedtime.   Yes [provider]  vitamin B-12 (CYANOCOBALAMIN) 1000 MCG tablet Take 1,000 mcg by mouth daily.   Yes [provider]    Social History    Socioeconomic History   Marital status: Married    Spouse name: Not on file   Number of children: Not on file   Years of education: Not on file   Highest education level: Not on file  Occupational History   Not on file  Tobacco Use   Smoking status: Former    Current packs/day: 0.00    Types: Cigarettes    Quit date: 05/18/1982    Years since quitting: 40.8   Smokeless tobacco: Never  Vaping Use   Vaping status: Never Used  Substance and Sexual Activity   Alcohol use: Not Currently    Alcohol/week: 1.0 standard drink of alcohol    Types: 1 Standard drinks or equivalent per week   Drug use: Never   Sexual activity: Not Currently  Other Topics Concern   Not on file  Social History Narrative   Not on file   Social Drivers of Health   Financial Resource Strain: Low Risk  (02/27/2022)   Received from [facility]  Care, [facility]   Overall Financial Resource Strain (CARDIA)    Difficulty of Paying Living Expenses: Not hard at all  Food Insecurity: No Food Insecurity (02/27/2022)   Received from [facility], [facility]   Hunger Vital Sign    Worried About Running Out of Food in the Last Year: Never true    Ran Out of Food in the Last Year: Never true  Transportation Needs: No Transportation Needs (02/27/2022)   Received from [facility], [facility]   [facility] - Transportation    Lack of Transportation (Medical): No    Lack of Transportation (Non-Medical): No  Physical Activity: Inactive (02/27/2022)   Received from [facility], [facility]   Exercise Vital Sign    Days of Exercise per Week: 0 days    Minutes of Exercise per Session: 0 min  Stress: No Stress Concern Present (02/27/2022)   Received from [facility], [facility] of Occupational Health - Occupational Stress Questionnaire    Feeling of Stress : Not at all  Social Connections: Socially Integrated (02/27/2022)   Received from [facility], [facility]  Care   Social Connection and Isolation Panel [NHANES]    Frequency of Communication with Friends and Family: More than three times a week    Frequency of Social Gatherings with Friends and Family: Twice a week    Attends Religious Services: More than 4 times per year    Active Member of [employer] or Organizations: Yes    Attends Engineer, structural: More than 4 times per year    Marital Status: Married  Catering manager Violence: Not At Risk (02/27/2022)   Received from [facility], [facility]   Humiliation, Afraid, Rape, and Kick questionnaire    Fear of Current or Ex-Partner: No    Emotionally Abused: No    Physically Abused: No    Sexually Abused: No     History reviewed. No pertinent family history.  ROS: Otherwise negative unless mentioned in HPI  Physical Examination  Vitals:   02/26/23 0209 02/26/23 0435  BP: (!) 166/80 (!) 183/73  Pulse: 63 60  Resp: 16 16  Temp:  98.2 F (36.8 C)  SpO2: 98% 98%   Body mass index is 29.42 kg/m.  General:  WDWN in NAD Gait: Not observed HENT: WNL, normocephalic Pulmonary: normal non-labored breathing, without Rales, rhonchi,  wheezing Cardiac: regular, without  Murmurs, rubs or gallops; without carotid bruits Abdomen: Positive bowel sounds, soft, NT/ND, no masses Skin: without rashes Vascular Exam/Pulses: Strong Pulse with positive thrill in left upper arm A/V Fistula Extremities: without ischemic changes, without Gangrene , without cellulitis; without open wounds;  Musculoskeletal: no muscle wasting or atrophy  Neurologic: A&O X 3;  No focal weakness or paresthesias are detected; speech is fluent/normal Psychiatric:  The pt has Normal affect. Lymph:  Unremarkable  CBC    Component Value Date/Time   WBC 4.4 02/25/2023 1341   RBC 3.55 (L) 02/25/2023 1341   HGB 11.0 (L) 02/25/2023 1341   HCT 35.9 (L) 02/25/2023 1341   PLT 72 (L) 02/25/2023 1341   MCV 101.1 (H) 02/25/2023 1341   MCH 31.0 02/25/2023 1341    MCHC 30.6 02/25/2023 1341   RDW 16.5 (H) 02/25/2023 1341   LYMPHSABS 1.0 02/23/2023 1918   MONOABS 0.5 02/23/2023 1918   EOSABS 0.1 02/23/2023 1918   BASOSABS 0.0 02/23/2023 1918    BMET  Component Value Date/Time   NA 139 02/25/2023 1341   K 3.8 02/25/2023 1341   CL 100 02/25/2023 1341   CO2 24 02/25/2023 1341   GLUCOSE 106 (H) 02/25/2023 1341   BUN 32 (H) 02/25/2023 1341   CREATININE 5.29 (H) 02/25/2023 1341   CALCIUM 8.8 (L) 02/25/2023 1341   CALCIUM 8.9 04/04/2020 1528   GFRNONAA 10 (L) 02/25/2023 1341   GFRAA (L) 07/30/2009 0530    41        The eGFR has been calculated using the MDRD equation. This calculation has not been validated in all clinical situations. eGFR's persistently <60 mL/min signify possible Chronic Kidney Disease.    COAGS: Lab Results  Component Value Date   INR 1.3 (H) 02/23/2023   INR 1.2 04/21/2022   INR 1.2 10/23/2021     Non-Invasive Vascular Imaging:   NONE  Statin:  Yes.   Beta Blocker:  Yes.   Aspirin:  No. ACEI:  No. ARB:  No. CCB use:  No Other antiplatelets/anticoagulants:  Yes.   Eliquis 5 mg BID   ASSESSMENT/PLAN: This is a 83 y.o. male presented to the emergency department from dialysis unit as his site he is unable to be accessed.  Patient endorses he can be accessed but was bleeding as it was deaccessed.  Patient has strong pulse and thrill in his left upper arm AV fistula.  Vascular surgery recommends AV fistulogram today with possible permacatheter dialysis access placement if needed.  Vascular surgery plans on taking the patient to the vascular lab on 02/26/2023 for left upper extremity AV fistulogram with possible intervention and possible dialysis permacatheter access if needed.  I discussed with the patient in detail the procedure, benefits, risk, and complications.  He verbalizes understanding and endorses he has had many of these before.  Patient would like to proceed as soon as possible.  I answered all the  patient's questions this morning.  The patient has been n.p.o. since midnight for the procedure today.  -I discussed the plan in detail with Dr. Levora Dredge MD and he agrees with the plan.   Marcie Bal [facility] 02/26/2023 8:39 AM

## 2023-02-26 NOTE — Interval H&P Note (Signed)
History and Physical Interval Note:  02/26/2023 4:19 PM  Taylor Willis  has presented today for surgery, with the diagnosis of end stage renal disease.  The various methods of treatment have been discussed with the patient and family. After consideration of risks, benefits and other options for treatment, the patient has consented to  Procedure(s): A/V Fistulagram (N/A) DIALYSIS/PERMA CATHETER INSERTION (N/A) as a surgical intervention.  The patient's history has been reviewed, patient examined, no change in status, stable for surgery.  I have reviewed the patient's chart and labs.  Questions were answered to the patient's satisfaction.     Levora Dredge

## 2023-02-26 NOTE — Op Note (Signed)
OPERATIVE NOTE   PROCEDURE: Contrast injection left brachiocephalic fistula AV access Percutaneous transluminal angioplasty and stent placement left cephalic vein with an 8 mm Viabahn Percutaneous transluminal angioplasty and stent placement left subclavian vein with an 8 mm Viabahn. Placement of a right IJ cuffed tunneled dialysis catheter under fluoroscopic and ultrasound guidance  PRE-OPERATIVE DIAGNOSIS: Complication of dialysis access                                                       End Stage Renal Disease  POST-OPERATIVE DIAGNOSIS: same as above   SURGEON: Renford Dills, M.D.  ANESTHESIA: Conscious sedation was administered under my direct supervision by the interventional radiology RN. IV Versed plus fentanyl were utilized. Continuous ECG, pulse oximetry and blood pressure was monitored throughout the entire procedure.  Conscious sedation was for a total of 33 minutes and 19 seconds.  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 male who  presents with malfunctioning left arm brachiocephalic AV access.  The patient is scheduled for angiography with possible intervention of the AV access.  The patient is aware the risks include but are not limited to: bleeding, infection, thrombosis of the cannulated access, and possible anaphylactic reaction to the contrast.  The patient acknowledges if the access can not be salvaged a tunneled catheter will be needed and will be placed during this procedure.  The patient is aware of the risks of the procedure and elects to proceed with the angiogram and intervention.  DESCRIPTION: After full informed written consent was obtained, the patient was brought back to the Special Procedure suite and placed supine position.  Appropriate cardiopulmonary monitors were placed.  The left arm was  prepped and draped in the standard fashion.  Appropriate timeout is called.   The left brachiocephalic fistula was cannulated with a micropuncture needle.  Cannulation was performed with ultrasound guidance. Ultrasound was placed in a sterile sleeve, the AV access was interrogated and noted to be echolucent and compressible indicating patency. Image was recorded for the permanent record. The puncture is performed under continuous ultrasound visualization.   The microwire was advanced and the needle was exchanged for  a microsheath.  The J-wire was then advanced and a 6 Fr sheath inserted.  Hand injections were completed to image the access from the arterial anastomosis through the entire access.  The central venous structures were also imaged by hand injections.  Diagnostic interpretation: The fistula itself in the peripheral segment is patent and there are no hemodynamically significant stenoses identified.  The central venous anatomy demonstrates a greater than 70% stenosis within the cephalic vein at the level of the deltoid muscle additionally the subclavian vein is greater than 70% extending from the confluence with the cephalic vein up to the confluence with the jugular vein.  Based on the images,  4000 units of heparin was given and the 6 French sheath was upsized to a 7 [place] sheath.  A wire was negotiated through the central strictures.  An 8 mm x 100 mm Viabahn stent is then advanced over the V18 wire up to the confluence of the jugular vein and then deployed.  An 8 mm x 60 mm  Dorado balloon was then used to post dilate the stent.  Inflation was to 30 atmospheres for 1 minute.  Next a 8 mm x 25 mm Viabahn stent was deployed in the cephalic vein at the level of the deltoid muscle.  This was postdilated with the 8 mm Dorado balloon as well inflation was to 16 atm for 1 minute  Follow-up imaging demonstrates complete resolution of the stricture in both the cephalic vein as well as the subclavian  vein, less than 15% residual stenosis is noted, with rapid flow of contrast through the central venous anatomy.  The fistula itself now has a thrill and is minimally pulsatile a marked distinction between the preoperative lack of a thrill and strong pulsatility.  A 4-0 Monocryl purse-string suture was sewn around the sheath.  The sheath was removed and light pressure was applied.  A sterile bandage was applied to the puncture site.  Attention was then turned to the right neck and chest wall.  The right neck and chest wall are then infiltrated with 1% Lidocaine with epinepherine.  A 19 cm tip to cuff Cannon catheter is then selected, opened on the back table and prepped. Ultrasound is placed in a sterile sleeve.  Under ultrasound guidance, the right IJ vein is examined and is noted to be echolucent and easily compressible indicating patency.   An image is recorded for the permanent record.  The right IJ vein is cannulated with the microneedle under direct ultrasound vissualization.  A Microwire followed by a micro sheath is then inserted without difficulty.   A J-wire was then advanced under fluoroscopic guidance into the inferior vena cava and the wire was secured.  Small counter incision was then made at the wire insertion site. A small pocket was fashioned with blunt dissection to allow easier passage of the cuff.  The dilator and peel-away sheath are then advanced over the wire under fluoroscopic guidance. The catheters and advanced through the peel-away sheath. It is approximated to the right chest wall after verifying the tips at the atrial caval junction and an exit site is selected.  Small incision is made at the selected exit site and the tunneling device was passed subcutaneously to the counter incision. Catheter is then pulled through the subcutaneous tunnel. The catheter is then verified for tip position under fluoroscopy, transected and the hub assembly connected.    Each port was tested by  aspirating and flushing.  No resistance was noted.  Each port was then thoroughly flushed with heparinized saline.  The catheter was secured in placed with two interrupted stitches of 0 silk tied to the catheter.  The counter incision was closed with a U-stitch of 4-0 Monocryl.  The insertion site is then cleaned and sterile bandages applied including a Biopatch.  Each port was then packed with concentrated heparin (1000 Units/mL) at the manufacturer recommended volumes to each port.  Sterile caps were applied to each port.  On completion fluoroscopy, the tips of the catheter were in the right atrium, and there was no evidence of pneumothorax.    COMPLICATIONS: None  CONDITION: Almon Register, M.D [facility] Office: [phone]  02/26/2023 6:38 PM

## 2023-02-26 NOTE — Progress Notes (Signed)
[facility]  ROUNDING NOTE   Subjective:   Taylor Willis is a 83 y.o. male with past medical history of hypertension, atrial fibrillation, hyperlipidemia, and end-stage renal disease on hemodialysis.  Patient presents to the emergency department with a malfunctioning access and has been admitted under observation for Problem with dialysis access, initial encounter [facility]) [I34.742V]  Patient is known to our practice and receives outpatient dialysis treatments at [facility] on a TTS schedule, supervised by Dr. Cherylann Ratel. He has a recent history of his access malfunctioning. Attempts made at [facility] to correct issue and control bleeding unsuccessful. Patient will undergo fistulogram later today for evaluation.   Labs unremarkable for renal patient.  Patient seen on room air, denies shortness of breath.  No peripheral edema noted.   Objective:  Vital signs in last 24 hours:  Temp:  [97.6 F (36.4 C)-98.2 F (36.8 C)] 97.8 F (36.6 C) (01/24 0853) Pulse Rate:  [57-93] 58 (01/24 0947) Resp:  [16-19] 19 (01/24 0858) BP: (158-183)/(67-80) 176/76 (01/24 0947) SpO2:  [97 %-100 %] 97 % (01/24 0858) Weight:  [98.4 kg] 98.4 kg (01/23 1339)  Weight change:  Filed Weights   02/25/23 1339  Weight: 98.4 kg    Intake/Output: No intake/output data recorded.   Intake/Output this shift:  No intake/output data recorded.  Physical Exam: General: NAD  Head: Normocephalic, atraumatic. Moist oral mucosal membranes  Eyes: Anicteric  Lungs:  Clear to auscultation, normal effort, room air  Heart: Regular rate and rhythm  Abdomen:  Soft, nontender, obese  Extremities: No peripheral edema.  Neurologic: Nonfocal, moving all four extremities  Skin: No lesions  Access: Left upper aVF    Basic Metabolic Panel: Recent Labs  Lab 02/23/23 1918 02/25/23 1341  NA 136 139  K 3.5 3.8  CL 95* 100  CO2 30 24  GLUCOSE 93 106*  BUN 15 32*  CREATININE 3.43* 5.29*  CALCIUM 8.4* 8.8*     Liver Function Tests: Recent Labs  Lab 02/23/23 1918 02/25/23 1341  AST 19 28  ALT 19 22  ALKPHOS 60 54  BILITOT 0.7 0.9  PROT 6.4* 6.5  ALBUMIN 3.5 3.4*   No results for input(s): "LIPASE", "AMYLASE" in the last 168 hours. No results for input(s): "AMMONIA" in the last 168 hours.  CBC: Recent Labs  Lab 02/23/23 1918 02/25/23 1341  WBC 4.3 4.4  NEUTROABS 2.7  --   HGB 10.8* 11.0*  HCT 34.7* 35.9*  MCV 100.9* 101.1*  PLT 78* 72*    Cardiac Enzymes: No results for input(s): "CKTOTAL", "CKMB", "CKMBINDEX", "TROPONINI" in the last 168 hours.  BNP: Invalid input(s): "POCBNP"  CBG: No results for input(s): "GLUCAP" in the last 168 hours.  Microbiology: Results for orders placed or performed during the hospital encounter of 07/05/20  Resp Panel by RT-PCR (Flu A&B, Covid) Nasopharyngeal Swab     Status: None   Collection Time: 07/05/20  1:01 PM   Specimen: Nasopharyngeal Swab; Nasopharyngeal(NP) swabs in vial transport medium  Result Value Ref Range Status   SARS Coronavirus 2 by RT PCR NEGATIVE NEGATIVE Final    Comment: (NOTE) SARS-CoV-2 target nucleic acids are NOT DETECTED.  The SARS-CoV-2 RNA is generally detectable in upper respiratory specimens during the acute phase of infection. The lowest concentration of SARS-CoV-2 viral copies this assay can detect is 138 copies/mL. A negative result does not preclude SARS-Cov-2 infection and should not be used as the sole basis for treatment or other patient management decisions. A negative  result may occur with  improper specimen collection/handling, submission of specimen other than nasopharyngeal swab, presence of viral mutation(s) within the areas targeted by this assay, and inadequate number of viral copies(<138 copies/mL). A negative result must be combined with clinical observations, patient history, and epidemiological information. The expected result is Negative.  Fact Sheet for Patients:   BloggerCourse.com  Fact Sheet for Healthcare Providers:  SeriousBroker.it  This test is no t yet approved or cleared by the [place] FDA and  has been authorized for detection and/or diagnosis of SARS-CoV-2 by FDA under an Emergency Use Authorization (EUA). This EUA will remain  in effect (meaning this test can be used) for the duration of the COVID-19 declaration under Section 564(b)(1) of the Act, 21 U.S.C.section 360bbb-3(b)(1), unless the authorization is terminated  or revoked sooner.       Influenza A by PCR NEGATIVE NEGATIVE Final   Influenza B by PCR NEGATIVE NEGATIVE Final    Comment: (NOTE) The Xpert Xpress SARS-CoV-2/FLU/RSV plus assay is intended as an aid in the diagnosis of influenza from Nasopharyngeal swab specimens and should not be used as a sole basis for treatment. Nasal washings and aspirates are unacceptable for Xpert Xpress SARS-CoV-2/FLU/RSV testing.  Fact Sheet for Patients: BloggerCourse.com  Fact Sheet for Healthcare Providers: SeriousBroker.it  This test is not yet approved or cleared by the [place] FDA and has been authorized for detection and/or diagnosis of SARS-CoV-2 by FDA under an Emergency Use Authorization (EUA). This EUA will remain in effect (meaning this test can be used) for the duration of the COVID-19 declaration under Section 564(b)(1) of the Act, 21 U.S.C. section 360bbb-3(b)(1), unless the authorization is terminated or revoked.  Performed at [facility], [address], [place], [place] [address]     Coagulation Studies: Recent Labs    02/23/23 1918  LABPROT 16.3*  INR 1.3*    Urinalysis: No results for input(s): "COLORURINE", "LABSPEC", "PHURINE", "GLUCOSEU", "HGBUR", "BILIRUBINUR", "KETONESUR", "PROTEINUR", "UROBILINOGEN", "NITRITE", "LEUKOCYTESUR" in the last 72 hours.  Invalid input(s):  "APPERANCEUR"    Imaging: No results found.   Medications:    sodium chloride     sodium chloride      ceFAZolin (ANCEF) IV      [START ON 02/27/2023] calcitRIOL  0.25 mcg Oral Q T,Th,Sa-HD   [START ON 02/27/2023] carvedilol  3.125 mg Oral Once per day on Tuesday Thursday Saturday   And   carvedilol  3.125 mg Oral 2 times per day on Sunday Monday Wednesday Friday   cyanocobalamin  1,000 mcg Oral Daily   docusate sodium  100 mg Oral BID   Fe Fum-Vit C-Vit B12-FA  1 capsule Oral Daily   furosemide  120 mg Oral Daily   multivitamin  1 tablet Oral QHS   rosuvastatin  5 mg Oral QHS   senna  1 tablet Oral BID   sertraline  25 mg Oral Daily   sevelamer carbonate  2,400 mg Oral BID WC   sodium chloride flush  3 mL Intravenous Q12H   traZODone  25 mg Oral QHS   sodium chloride, acetaminophen **OR** acetaminophen, albuterol, diphenhydrAMINE, famotidine, fentaNYL (SUBLIMAZE) injection, hydrALAZINE, methylPREDNISolone (SOLU-MEDROL) injection, midazolam  Assessment/ Plan:  Mr. Taylor Willis is a 83 y.o.  male with past medical history of hypertension, atrial fibrillation, hyperlipidemia, and end-stage renal disease on hemodialysis.  Patient presents to the emergency department with a malfunctioning access and has been admitted under observation for Problem with dialysis access, initial encounter [facility]) [  T82.898A]  [facility]/TTS/left AVF  Malfunctioning vascular access, left upper AVF.  Recent episodes of prolonged bleeding.  Patient currently n.p.o. and vascular planning fistulogram later today.  2.  End-stage renal disease on hemodialysis.  Last treatment completed on Tuesday.  Labs and respiratory status stable.  Will plan for dialysis after vascular procedure.  3. Anemia of chronic kidney disease  Lab Results  Component Value Date   HGB 11.0 (L) 02/25/2023    Hemoglobin within optimal range at this time.  4. Secondary Hyperparathyroidism: with outpatient labs: None  available at this time   Lab Results  Component Value Date   PTH 133 (H) 04/04/2020   PTH Comment 04/04/2020   CALCIUM 8.8 (L) 02/25/2023   CAION 1.13 (L) 10/09/2022   PHOS 2.6 07/07/2020    Will continue to monitor bone minerals during this admission.   LOS: 0 Taylor Willis 1/24/202512:00 PM

## 2023-02-26 NOTE — Progress Notes (Signed)
Please call wife, Kendal Hymen, at [phone], to keep her updated on when patient is going to dialysis.

## 2023-02-26 NOTE — Care Management Obs Status (Signed)
MEDICARE OBSERVATION STATUS NOTIFICATION   Patient Details  Name: Taylor Willis MRN: [id number] Date of Birth: 1940/03/07   Medicare Observation Status Notification Given:  Orland Dec, CMA 02/26/2023, 2:29 PM

## 2023-02-26 NOTE — H&P (View-Only) (Signed)
Hospital Consult    Reason for Consult:  Bleeding left Upper Extremity A/V Fistula Requesting Physician:  Marcelino Duster MD  MRN #:  [id number]  History of Present Illness: This is a 83 y.o. male with medical history significant of hypertension, hyperlipidemia, atrial fibrillation, ESRD on hemodialysis on Tuesday, Thursday, Saturday with left arm AV fistula presented to the emergency department from dialysis unit as his site he is unable to be accessed.   On exam today the patient is resting comfortably in bed in the emergency department.  Patient endorses that they are able to access his AV fistula but he had bleeding afterwards that was concerning.  His left upper arm AV fistula has a strong pulse and a thrill.  His hand is warm with palpable radial pulses.  He has good capillary refill.  No complaints overnight vitals all remained stable.  Past Medical History:  Diagnosis Date   Arthritis    gout   Asthma    has rescue inhaler   Cancer (HCC)    prostate   Chronic kidney disease    GERD (gastroesophageal reflux disease)    H/O peptic ulcer 1970's   Herpes 04/28/2020   Hyperlipidemia    Hypertension    Ureteral stone    Wears dentures    Wears glasses     Past Surgical History:  Procedure Laterality Date   A/V FISTULAGRAM Left 10/09/2022   Procedure: A/V Fistulagram;  Surgeon: Leonie Douglas, MD;  Location: [facility];  Service: Cardiovascular;  Laterality: Left;   AV FISTULA PLACEMENT Left 02/15/2019   Procedure: ARTERIOVENOUS (AV) FISTULA CREATION LEFT ARM;  Surgeon: Larina Earthly, MD;  Location: [facility] OR;  Service: Vascular;  Laterality: Left;   CATARACT EXTRACTION W/ INTRAOCULAR LENS  IMPLANT, BILATERAL     CYSTOSCOPY/RETROGRADE/URETEROSCOPY/STONE EXTRACTION WITH BASKET Right 05/23/2012   Procedure: CYSTOSCOPY/RETROGRADE/URETEROSCOPY/STONE EXTRACTION WITH BASKET;  Surgeon: Valetta Fuller, MD;  Location: [facility];  Service: Urology;  Laterality:  Right;   FRACTURE SURGERY Right    as a child   HERNIA REPAIR     HOLMIUM LASER APPLICATION Right 05/23/2012   Procedure: HOLMIUM LASER APPLICATION;  Surgeon: Valetta Fuller, MD;  Location: [facility];  Service: Urology;  Laterality: Right;   INSERTION OF DIALYSIS CATHETER Right 04/08/2020   Procedure: INSERTION OF TUNNELED DIALYSIS CATHETER;  Surgeon: Lucretia Roers, MD;  Location: [facility];  Service: General;  Laterality: Right;   LIGATION OF ARTERIOVENOUS  FISTULA Left 09/12/2019   Procedure: SIDE BRANCH LIGATION  OF LEFT ARM ARTERIOVENOUS FISTULA;  Surgeon: Sherren Kerns, MD;  Location: [facility] OR;  Service: Vascular;  Laterality: Left;   MULTIPLE TOOTH EXTRACTIONS     PERIPHERAL VASCULAR BALLOON ANGIOPLASTY  05/22/2020   Procedure: PERIPHERAL VASCULAR BALLOON ANGIOPLASTY;  Surgeon: Leonie Douglas, MD;  [facility]: [facility];  Service: Cardiovascular;;  Left arm fistula   PERIPHERAL VASCULAR BALLOON ANGIOPLASTY Left 10/09/2022   Procedure: PERIPHERAL VASCULAR BALLOON ANGIOPLASTY;  Surgeon: Leonie Douglas, MD;  [facility]: [facility];  Service: Cardiovascular;  Laterality: Left;   REVISON OF ARTERIOVENOUS FISTULA Left 09/12/2019   Procedure: REVISON OF LEFT ARM ARTERIOVENOUS FISTULA;  Surgeon: Sherren Kerns, MD;  Location: [facility];  Service: Vascular;  Laterality: Left;   ROBOT ASSISTED LAPAROSCOPIC RADICAL PROSTATECTOMY  2011   TONSILLECTOMY      Allergies  Allergen Reactions   Aspirin Other (See Comments)    GI irritation  Hydrochlorothiazide     Dizziness and unable to walk   Thiazide-Type Diuretics Other (See Comments)    Dizziness and unable to walk   Ibuprofen Other (See Comments)    GI irritation     Prior to Admission medications   Medication Sig Start Date End Date Taking? Authorizing Provider  acetaminophen (TYLENOL) 500 MG tablet Take 500 mg by mouth every 6 (six) hours as needed for moderate pain.   Yes [provider]   albuterol (PROVENTIL HFA;VENTOLIN HFA) 108 (90 BASE) MCG/ACT inhaler Inhale 2 puffs into the lungs every 6 (six) hours as needed for wheezing.   Yes [provider]  allopurinol (ZYLOPRIM) 100 MG tablet Take 100 mg by mouth at bedtime. 12/04/19  Yes [provider]  apixaban (ELIQUIS) 5 MG TABS tablet Take 1 tablet (5 mg total) by mouth 2 (two) times daily. Patient taking differently: Take 2.5 mg by mouth See admin instructions. Take 2.5 mg Tues., Thurs.,and Sat, in the evening Take 2.5 mg on Monday, Wednesday, Friday and Sunday twice a day 07/07/20  Yes Doda, Traci Sermon, MD  B Complex-C-Folic Acid (TRIPHROCAPS) 1 MG CAPS Take 1 capsule by mouth daily.   Yes [provider]  calcitRIOL (ROCALTROL) 0.25 MCG capsule Take 0.25 mcg by mouth Every Tuesday,Thursday,and Saturday with dialysis.   Yes [provider]  carvedilol (COREG) 3.125 MG tablet Take 1 tablet (3.125 mg total) by mouth See admin instructions. Take 1 tablet in the evenings on Dialysis days (Tues, Thurs, and Sat), On Non-dialysis days take 1 tablet twice a day. Patient taking differently: Take 3.125 mg by mouth every evening. 04/29/20  Yes Emokpae, Courage, MD  furosemide (LASIX) 40 MG tablet Take 120 mg by mouth daily. 07/17/21  Yes [provider]  multivitamin (RENA-VIT) TABS tablet Take 1 tablet by mouth at bedtime. 07/25/21  Yes [provider]  rosuvastatin (CRESTOR) 5 MG tablet Take 5 mg by mouth at bedtime.   Yes [provider]  sertraline (ZOLOFT) 25 MG tablet Take 25 mg by mouth daily. 07/29/21  Yes [provider]  sevelamer carbonate (RENVELA) 800 MG tablet Take 2,400 mg by mouth 2 (two) times daily. 09/19/22  Yes [provider]  traZODone (DESYREL) 50 MG tablet Take 25 mg by mouth at bedtime.   Yes [provider]  vitamin B-12 (CYANOCOBALAMIN) 1000 MCG tablet Take 1,000 mcg by mouth daily.   Yes [provider]    Social History    Socioeconomic History   Marital status: Married    Spouse name: Not on file   Number of children: Not on file   Years of education: Not on file   Highest education level: Not on file  Occupational History   Not on file  Tobacco Use   Smoking status: Former    Current packs/day: 0.00    Types: Cigarettes    Quit date: 05/18/1982    Years since quitting: 40.8   Smokeless tobacco: Never  Vaping Use   Vaping status: Never Used  Substance and Sexual Activity   Alcohol use: Not Currently    Alcohol/week: 1.0 standard drink of alcohol    Types: 1 Standard drinks or equivalent per week   Drug use: Never   Sexual activity: Not Currently  Other Topics Concern   Not on file  Social History Narrative   Not on file   Social Drivers of Health   Financial Resource Strain: Low Risk  (02/27/2022)   Received from [facility]  Care, [facility]   Overall Financial Resource Strain (CARDIA)    Difficulty of Paying Living Expenses: Not hard at all  Food Insecurity: No Food Insecurity (02/27/2022)   Received from [facility], [facility]   Hunger Vital Sign    Worried About Running Out of Food in the Last Year: Never true    Ran Out of Food in the Last Year: Never true  Transportation Needs: No Transportation Needs (02/27/2022)   Received from [facility], [facility]   [facility] - Transportation    Lack of Transportation (Medical): No    Lack of Transportation (Non-Medical): No  Physical Activity: Inactive (02/27/2022)   Received from [facility], [facility]   Exercise Vital Sign    Days of Exercise per Week: 0 days    Minutes of Exercise per Session: 0 min  Stress: No Stress Concern Present (02/27/2022)   Received from [facility], [facility] - Occupational Stress Questionnaire    Feeling of Stress : Not at all  Social Connections: Socially Integrated (02/27/2022)   Received from [facility], [facility]  Care   Social Connection and Isolation Panel [NHANES]    Frequency of Communication with Friends and Family: More than three times a week    Frequency of Social Gatherings with Friends and Family: Twice a week    Attends Religious Services: More than 4 times per year    Active Member of [employer] or Organizations: Yes    Attends Engineer, structural: More than 4 times per year    Marital Status: Married  Catering manager Violence: Not At Risk (02/27/2022)   Received from [facility], [facility]   Humiliation, Afraid, Rape, and Kick questionnaire    Fear of Current or Ex-Partner: No    Emotionally Abused: No    Physically Abused: No    Sexually Abused: No     History reviewed. No pertinent family history.  ROS: Otherwise negative unless mentioned in HPI  Physical Examination  Vitals:   02/26/23 0209 02/26/23 0435  BP: (!) 166/80 (!) 183/73  Pulse: 63 60  Resp: 16 16  Temp:  98.2 F (36.8 C)  SpO2: 98% 98%   Body mass index is 29.42 kg/m.  General:  WDWN in NAD Gait: Not observed HENT: WNL, normocephalic Pulmonary: normal non-labored breathing, without Rales, rhonchi,  wheezing Cardiac: regular, without  Murmurs, rubs or gallops; without carotid bruits Abdomen: Positive bowel sounds, soft, NT/ND, no masses Skin: without rashes Vascular Exam/Pulses: Strong Pulse with positive thrill in left upper arm A/V Fistula Extremities: without ischemic changes, without Gangrene , without cellulitis; without open wounds;  Musculoskeletal: no muscle wasting or atrophy  Neurologic: A&O X 3;  No focal weakness or paresthesias are detected; speech is fluent/normal Psychiatric:  The pt has Normal affect. Lymph:  Unremarkable  CBC    Component Value Date/Time   WBC 4.4 02/25/2023 1341   RBC 3.55 (L) 02/25/2023 1341   HGB 11.0 (L) 02/25/2023 1341   HCT 35.9 (L) 02/25/2023 1341   PLT 72 (L) 02/25/2023 1341   MCV 101.1 (H) 02/25/2023 1341   MCH 31.0 02/25/2023 1341    MCHC 30.6 02/25/2023 1341   RDW 16.5 (H) 02/25/2023 1341   LYMPHSABS 1.0 02/23/2023 1918   MONOABS 0.5 02/23/2023 1918   EOSABS 0.1 02/23/2023 1918   BASOSABS 0.0 02/23/2023 1918    BMET  Component Value Date/Time   NA 139 02/25/2023 1341   K 3.8 02/25/2023 1341   CL 100 02/25/2023 1341   CO2 24 02/25/2023 1341   GLUCOSE 106 (H) 02/25/2023 1341   BUN 32 (H) 02/25/2023 1341   CREATININE 5.29 (H) 02/25/2023 1341   CALCIUM 8.8 (L) 02/25/2023 1341   CALCIUM 8.9 04/04/2020 1528   GFRNONAA 10 (L) 02/25/2023 1341   GFRAA (L) 07/30/2009 0530    41        The eGFR has been calculated using the MDRD equation. This calculation has not been validated in all clinical situations. eGFR's persistently <60 mL/min signify possible Chronic Kidney Disease.    COAGS: Lab Results  Component Value Date   INR 1.3 (H) 02/23/2023   INR 1.2 04/21/2022   INR 1.2 10/23/2021     Non-Invasive Vascular Imaging:   NONE  Statin:  Yes.   Beta Blocker:  Yes.   Aspirin:  No. ACEI:  No. ARB:  No. CCB use:  No Other antiplatelets/anticoagulants:  Yes.   Eliquis 5 mg BID   ASSESSMENT/PLAN: This is a 83 y.o. male presented to the emergency department from dialysis unit as his site he is unable to be accessed.  Patient endorses he can be accessed but was bleeding as it was deaccessed.  Patient has strong pulse and thrill in his left upper arm AV fistula.  Vascular surgery recommends AV fistulogram today with possible permacatheter dialysis access placement if needed.  Vascular surgery plans on taking the patient to the vascular lab on 02/26/2023 for left upper extremity AV fistulogram with possible intervention and possible dialysis permacatheter access if needed.  I discussed with the patient in detail the procedure, benefits, risk, and complications.  He verbalizes understanding and endorses he has had many of these before.  Patient would like to proceed as soon as possible.  I answered all the  patient's questions this morning.  The patient has been n.p.o. since midnight for the procedure today.  -I discussed the plan in detail with Dr. Levora Dredge MD and he agrees with the plan.   Marcie Bal [facility] 02/26/2023 8:39 AM

## 2023-02-26 NOTE — Progress Notes (Signed)
Progress Note   Patient: Taylor Willis:[id number] DOB: 02/23/1940 DOA: 02/25/2023     0 DOS: the patient was seen and examined on 02/26/2023   Brief hospital course: Taylor Willis is a 83 y.o. male with medical history significant of hypertension, hyperlipidemia, atrial fibrillation, ESRD on hemodialysis on Tuesday, Thursday, Saturday with left arm AV fistula presented to the emergency department from dialysis unit as his site he is unable to be accessed. Vascular surgery team consulted for perma cath placement and HD session after.  Assessment and Plan: Unable to access AV fistula. Bleeding AV fistula NPO. Hold Eliquis for now. Vascular surgery to place permacath today.   ESRD on hemodialysis: Nephrology aware of him, He will get HD after perm cath placement.. Continue Renvela, Nephrocaps, Rocaltrol home dose.   Hypertension. BP elevated.  Continue beta-blocker therapy. IV hydralazine PRN for high BP ordered.   Hyperlipidemia. Continue statin therapy.   Paroxysmal atrial fibrillation. Continue carvedilol home dose, telemetry monitoring. Eliquis on hold due to vascular procedure today.      Out of bed to chair. Incentive spirometry. Nursing supportive care. Fall, aspiration precautions. DVT prophylaxis   Code Status: Full Code  Subjective: Patient is seen and examined today morning. He is lying comfortably. Awaiting for perm cath procedure.   Physical Exam: Vitals:   02/26/23 0435 02/26/23 0853 02/26/23 0858 02/26/23 0947  BP: (!) 183/73  (!) 176/76 (!) 176/76  Pulse: 60  (!) 58 (!) 58  Resp: 16  19   Temp: 98.2 F (36.8 C) 97.8 F (36.6 C)    TempSrc: Oral Oral    SpO2: 98%  97%   Weight:      Height:        General - Elderly Caucasian male, no apparent distress HEENT - PERRLA, EOMI, atraumatic head, non tender sinuses. Lung - Clear, rales, rhonchi, wheezes. Heart - S1, S2 heard, no murmurs, rubs, 1+ pitting pedal edema. Abdomen-soft, nontender,  distended, bowel sounds good Neuro - Alert, awake and oriented, slow mentation, non focal exam. Skin - Warm and dry.  Left arm AV fistula with dressing noted without bleeding.  Data Reviewed:      Latest Ref Rng & Units 02/25/2023    1:41 PM 02/23/2023    7:18 PM 12/17/2022    4:12 PM  CBC  WBC 4.0 - 10.5 K/uL 4.4  4.3  4.1   Hemoglobin 13.0 - 17.0 g/dL 81.1  91.4  78.2   Hematocrit 39.0 - 52.0 % 35.9  34.7  33.4   Platelets 150 - 400 K/uL 72  78  104       Latest Ref Rng & Units 02/25/2023    1:41 PM 02/23/2023    7:18 PM 12/17/2022    4:12 PM  BMP  Glucose 70 - 99 mg/dL 956  93  213   BUN 8 - 23 mg/dL 32  15  11   Creatinine 0.61 - 1.24 mg/dL 0.86  5.78  4.69   Sodium 135 - 145 mmol/L 139  136  137   Potassium 3.5 - 5.1 mmol/L 3.8  3.5  3.5   Chloride 98 - 111 mmol/L 100  95  98   CO2 22 - 32 mmol/L 24  30  30    Calcium 8.9 - 10.3 mg/dL 8.8  8.4  8.2    No results found.  Family Communication: Discussed with patient, he understand and agree. All questions answereed.  Disposition: Status is: Observation The patient remains OBS  appropriate and will d/c before 2 midnights.  Planned Discharge Destination: Home     Time spent: 37 minutes  Author: Marcelino Duster, MD 02/26/2023 11:05 AM Secure chat 7am to 7pm For on call review [email or web address].

## 2023-02-26 NOTE — Discharge Planning (Signed)
ESTABLISHED HEMODIALYSIS Outpatient Facility  [facility]  [address] [place], [place] [address] [phone]  Schedule: TTS 10:15am  Dimas Chyle Dialysis Coordinator II  [facility] Cell: [phone] eFax: [phone] [email or web address]

## 2023-02-27 DIAGNOSIS — N186 End stage renal disease: Secondary | ICD-10-CM | POA: Diagnosis not present

## 2023-02-27 DIAGNOSIS — T8249XA Other complication of vascular dialysis catheter, initial encounter: Secondary | ICD-10-CM | POA: Diagnosis not present

## 2023-02-27 DIAGNOSIS — T82898A Other specified complication of vascular prosthetic devices, implants and grafts, initial encounter: Secondary | ICD-10-CM | POA: Diagnosis not present

## 2023-02-27 DIAGNOSIS — D649 Anemia, unspecified: Secondary | ICD-10-CM

## 2023-02-27 DIAGNOSIS — I10 Essential (primary) hypertension: Secondary | ICD-10-CM | POA: Diagnosis not present

## 2023-02-27 DIAGNOSIS — K219 Gastro-esophageal reflux disease without esophagitis: Secondary | ICD-10-CM | POA: Diagnosis not present

## 2023-02-27 LAB — RENAL FUNCTION PANEL
Albumin: 3.1 g/dL — ABNORMAL LOW (ref 3.5–5.0)
Anion gap: 14 (ref 5–15)
BUN: 49 mg/dL — ABNORMAL HIGH (ref 8–23)
CO2: 25 mmol/L (ref 22–32)
Calcium: 8.8 mg/dL — ABNORMAL LOW (ref 8.9–10.3)
Chloride: 103 mmol/L (ref 98–111)
Creatinine, Ser: 7.2 mg/dL — ABNORMAL HIGH (ref 0.61–1.24)
GFR, Estimated: 7 mL/min — ABNORMAL LOW (ref >60)
Glucose, Bld: 113 mg/dL — ABNORMAL HIGH (ref 70–99)
Phosphorus: 5.8 mg/dL — ABNORMAL HIGH (ref 2.5–4.6)
Potassium: 3.9 mmol/L (ref 3.5–5.1)
Sodium: 142 mmol/L (ref 135–145)

## 2023-02-27 LAB — CBC
HCT: 29.6 % — ABNORMAL LOW (ref 39.0–52.0)
Hemoglobin: 9.5 g/dL — ABNORMAL LOW (ref 13.0–17.0)
MCH: 31.6 pg (ref 26.0–34.0)
MCHC: 32.1 g/dL (ref 30.0–36.0)
MCV: 98.3 fL (ref 80.0–100.0)
Platelets: 72 10*3/uL — ABNORMAL LOW (ref 150–400)
RBC: 3.01 MIL/uL — ABNORMAL LOW (ref 4.22–5.81)
RDW: 16.6 % — ABNORMAL HIGH (ref 11.5–15.5)
WBC: 5.1 10*3/uL (ref 4.0–10.5)
nRBC: 0 % (ref 0.0–0.2)

## 2023-02-27 LAB — HEPATITIS B SURFACE ANTIBODY, QUANTITATIVE: Hep B S AB Quant (Post): 121 m[IU]/mL

## 2023-02-27 MED ORDER — APIXABAN 2.5 MG PO TABS: 2.5000 mg | ORAL_TABLET | ORAL | Status: DC

## 2023-02-27 MED ORDER — ALTEPLASE 2 MG IJ SOLR: 2.0000 mg | Freq: Once | INTRAMUSCULAR | Status: DC | PRN

## 2023-02-27 MED ORDER — HEPARIN SODIUM (PORCINE) 1000 UNIT/ML DIALYSIS: 1000.0000 [IU] | INTRAMUSCULAR | Status: DC | PRN

## 2023-02-27 NOTE — Progress Notes (Signed)
[facility]  ROUNDING NOTE   Subjective:   Taylor Willis is a 83 y.o. male with past medical history of hypertension, atrial fibrillation, hyperlipidemia, and end-stage renal disease on hemodialysis.  Patient presents to the emergency department with a malfunctioning access and has been admitted under observation for Problem with dialysis access, initial encounter [facility]) [U98.119J] Problem with vascular access [Z78.9]  Patient is known to our practice and receives outpatient dialysis treatments at [facility] on a TTS schedule, supervised by Dr. Cherylann Ratel.   Patient seen sitting up in bed Partially completed breakfast tray at bedside Bruising noted at PermCath insertion site Patient complains of soreness.   Objective:  Vital signs in last 24 hours:  Temp:  [97.3 F (36.3 C)-98 F (36.7 C)] 97.6 F (36.4 C) (01/25 1128) Pulse Rate:  [0-77] 54 (01/25 1300) Resp:  [13-21] 19 (01/25 1300) BP: (108-199)/(57-101) 108/67 (01/25 1300) SpO2:  [93 %-100 %] 100 % (01/25 1300) Weight:  [73.9 kg-98.4 kg] 73.9 kg (01/25 1133)  Weight change: 0 kg Filed Weights   02/25/23 1339 02/26/23 1521 02/27/23 1133  Weight: 98.4 kg 98.4 kg 73.9 kg    Intake/Output: I/O last 3 completed shifts: In: 104.8 [I.V.:53.1; IV Piggyback:51.7] Out: -    Intake/Output this shift:  No intake/output data recorded.  Physical Exam: General: NAD  Head: Normocephalic, atraumatic. Moist oral mucosal membranes  Eyes: Anicteric  Lungs:  Clear to auscultation, normal effort, room air  Heart: Regular rate and rhythm  Abdomen:  Soft, nontender, obese  Extremities: No peripheral edema.  Neurologic: Nonfocal, moving all four extremities  Skin: No lesions  Access: Left upper aVF, right chest PermCath    Basic Metabolic Panel: Recent Labs  Lab 02/23/23 1918 02/25/23 1341 02/27/23 1144  NA 136 139 142  K 3.5 3.8 3.9  CL 95* 100 103  CO2 30 24 25   GLUCOSE 93 106* 113*  BUN 15 32* 49*   CREATININE 3.43* 5.29* 7.20*  CALCIUM 8.4* 8.8* 8.8*  PHOS  --   --  5.8*    Liver Function Tests: Recent Labs  Lab 02/23/23 1918 02/25/23 1341 02/27/23 1144  AST 19 28  --   ALT 19 22  --   ALKPHOS 60 54  --   BILITOT 0.7 0.9  --   PROT 6.4* 6.5  --   ALBUMIN 3.5 3.4* 3.1*   No results for input(s): "LIPASE", "AMYLASE" in the last 168 hours. No results for input(s): "AMMONIA" in the last 168 hours.  CBC: Recent Labs  Lab 02/23/23 1918 02/25/23 1341 02/27/23 1144  WBC 4.3 4.4 5.1  NEUTROABS 2.7  --   --   HGB 10.8* 11.0* 9.5*  HCT 34.7* 35.9* 29.6*  MCV 100.9* 101.1* 98.3  PLT 78* 72* 72*    Cardiac Enzymes: No results for input(s): "CKTOTAL", "CKMB", "CKMBINDEX", "TROPONINI" in the last 168 hours.  BNP: Invalid input(s): "POCBNP"  CBG: No results for input(s): "GLUCAP" in the last 168 hours.  Microbiology: Results for orders placed or performed during the hospital encounter of 02/25/23  MRSA Next Gen by PCR, Nasal     Status: None   Collection Time: 02/26/23  8:22 PM   Specimen: Nasal Mucosa; Nasal Swab  Result Value Ref Range Status   MRSA by PCR Next Gen NOT DETECTED NOT DETECTED Final    Comment: (NOTE) The GeneXpert MRSA Assay (FDA approved for NASAL specimens only), is one component of a comprehensive MRSA colonization surveillance program. It is not  intended to diagnose MRSA infection nor to guide or monitor treatment for MRSA infections. Test performance is not FDA approved in patients less than 43 years old. Performed at [facility], [address] [place], [place] [address]     Coagulation Studies: No results for input(s): "LABPROT", "INR" in the last 72 hours.   Urinalysis: No results for input(s): "COLORURINE", "LABSPEC", "PHURINE", "GLUCOSEU", "HGBUR", "BILIRUBINUR", "KETONESUR", "PROTEINUR", "UROBILINOGEN", "NITRITE", "LEUKOCYTESUR" in the last 72 hours.  Invalid input(s): "APPERANCEUR"    Imaging: PERIPHERAL  VASCULAR CATHETERIZATION Result Date: 02/26/2023 See surgical note for result.    Medications:      calcitRIOL  0.25 mcg Oral Q T,Th,Sa-HD   carvedilol  3.125 mg Oral Once per day on Tuesday Thursday Saturday   And   carvedilol  3.125 mg Oral 2 times per day on Sunday Monday Wednesday Friday   Chlorhexidine Gluconate Cloth  6 each Topical Daily   cyanocobalamin  1,000 mcg Oral Daily   docusate sodium  100 mg Oral BID   Fe Fum-Vit C-Vit B12-FA  1 capsule Oral Daily   furosemide  120 mg Oral Daily   multivitamin  1 tablet Oral QHS   rosuvastatin  5 mg Oral QHS   senna  1 tablet Oral BID   sertraline  25 mg Oral Daily   sevelamer carbonate  2,400 mg Oral BID WC   traZODone  25 mg Oral QHS   acetaminophen **OR** acetaminophen, albuterol, alteplase, heparin, hydrALAZINE  Assessment/ Plan:  Taylor Willis is a 83 y.o.  male with past medical history of hypertension, atrial fibrillation, hyperlipidemia, and end-stage renal disease on hemodialysis.  Patient presents to the emergency department with a malfunctioning access and has been admitted under observation for Problem with dialysis access, initial encounter [facility]) [Q65.784O] Problem with vascular access [Z78.9]  [facility]/TTS/left AVF  Malfunctioning vascular access, left upper AVF.  Recent episodes of prolonged bleeding.  Vascular perform fistulogram yesterday, multiple areas of concern noted.  Vascular place PermCath access and will perform further evaluation of fistula as outpatient.  2.  End-stage renal disease on hemodialysis.  Last treatment completed on Tuesday.  Appreciate vascular placing right chest PermCath, will utilize this for dialysis today.  If stable, patient cleared to discharge after treatment.  3. Anemia of chronic kidney disease  Lab Results  Component Value Date   HGB 9.5 (L) 02/27/2023    Hemoglobin acceptable at this time.  4. Secondary Hyperparathyroidism: with outpatient labs: None  available at this time   Lab Results  Component Value Date   PTH 133 (H) 04/04/2020   PTH Comment 04/04/2020   CALCIUM 8.8 (L) 02/27/2023   CAION 1.13 (L) 10/09/2022   PHOS 5.8 (H) 02/27/2023    Calcium acceptable, mild hyperphosphatemia noted.  Will continue to monitor for now.   LOS: 0 Katey Barrie 1/25/20251:20 PM

## 2023-02-27 NOTE — TOC CM/SW Note (Signed)
Patient is not able to walk the distance required to go the bathroom, or he/she is unable to safely negotiate stairs required to access the bathroom.  A BSC will alleviate this problem  Rodney Langton, RN, MSN, CCM Transitions of Care

## 2023-02-27 NOTE — Progress Notes (Signed)
Hemodialysis Note:  Received patient in bed to unit. Oriented to person and place Informed consent singed and in chart.  Treatment initiated: 1144 Treatment completed: 1518  Access used: Right internal jugular catheter Access issues: None  Patient tolerated well. Transported back to room without acute distress. Report given to patient's RN.  Total UF removed: 1.5 L Medications given: Calcitriol 0.25 mcg tablet  Post HD weight: 72.4 kg  [facility]

## 2023-02-27 NOTE — TOC CM/SW Note (Signed)
Transition of Care [facility]) - Inpatient Brief Assessment   Patient Details  Name: Taylor Willis MRN: [id number] Date of Birth: Jul 12, 1940  Transition of Care [facility]) CM/SW Contact:    Rodney Langton, RN Phone Number: 02/27/2023, 4:50 PM   Clinical Narrative:  Patient has discharge orders, no Home health needs, but does request BSC.  Order received, Ada with [employer] notified, patient/wife are ok wit DME being delivered directly to the home. Per Christella Scheuermann, it will be delivered on Tuesday.  No further TOC needs identified.   Transition of Care Asessment: Insurance and Status: Insurance coverage has been reviewed Patient has primary care physician: Yes Home environment has been reviewed: Yes Prior level of function:: Independent Prior/Current Home Services: No current home services Social Drivers of Health Review: SDOH reviewed no interventions necessary Readmission risk has been reviewed: Yes Transition of care needs: transition of care needs identified, TOC will continue to follow

## 2023-02-27 NOTE — Discharge Summary (Signed)
Physician Discharge Summary   Patient: Taylor Willis MRN: [id number] DOB: 10-28-1940  Admit date:     02/25/2023  Discharge date: {dischdate:26783}  Discharge Physician: Marcelino Duster   PCP: Donetta Potts, MD   Recommendations at discharge:  {Tip this will not be part of the note when signed- Example include specific recommendations for outpatient follow-up, pending tests to follow-up on. (Optional):26781}  ***  Discharge Diagnoses: Principal Problem:   Problem with dialysis access, initial encounter [facility])  Resolved Problems:   * No resolved hospital problems. [facility] Course: No notes on file  Assessment and Plan: No notes have been filed under this hospital service. Service: Hospitalist     {Tip this will not be part of the note when signed Body mass index is 21.65 kg/m. , ,  Active Pressure Injury/Wound(s)     Pressure Ulcer  Duration          Pressure Injury 04/29/20 Buttocks Stage 2 -  Partial thickness loss of dermis presenting as a shallow open injury with a red, pink wound bed without slough. stage 2 to buttocks .5 cm x .5 cm 1034 days           (Optional):26781}  {(NOTE) Pain control PDMP Statment (Optional):26782} Consultants: *** Procedures performed: ***  Disposition: {Plan; Disposition:26390} Diet recommendation:  Discharge Diet Orders (From admission, onward)     Start     Ordered   02/27/23 0000  Diet - low sodium heart healthy        02/27/23 1611           {Diet_Plan:26776} DISCHARGE MEDICATION: Allergies as of 02/27/2023       Reactions   Aspirin Other (See Comments)   GI irritation   Hydrochlorothiazide    Dizziness and unable to walk   Thiazide-type Diuretics Other (See Comments)   Dizziness and unable to walk   Ibuprofen Other (See Comments)   GI irritation        Medication List     TAKE these medications    acetaminophen 500 MG tablet Commonly known as: TYLENOL Take 500 mg by mouth every 6 (six)  hours as needed for moderate pain.   albuterol 108 (90 Base) MCG/ACT inhaler Commonly known as: VENTOLIN HFA Inhale 2 puffs into the lungs every 6 (six) hours as needed for wheezing.   allopurinol 100 MG tablet Commonly known as: ZYLOPRIM Take 100 mg by mouth at bedtime.   apixaban 2.5 MG Tabs tablet Commonly known as: ELIQUIS Take 1 tablet (2.5 mg total) by mouth See admin instructions. Take 2.5 mg Tues.Ronda Fairly Sat, in the evening Take 2.5 mg on Monday, Wednesday, Friday and Sunday twice a day What changed:  medication strength how much to take when to take this additional instructions   calcitRIOL 0.25 MCG capsule Commonly known as: ROCALTROL Take 0.25 mcg by mouth Every Tuesday,Thursday,and Saturday with dialysis.   carvedilol 3.125 MG tablet Commonly known as: COREG Take 1 tablet (3.125 mg total) by mouth See admin instructions. Take 1 tablet in the evenings on Dialysis days (Tues, Thurs, and Sat), On Non-dialysis days take 1 tablet twice a day. What changed:  when to take this additional instructions   cyanocobalamin 1000 MCG tablet Commonly known as: VITAMIN B12 Take 1,000 mcg by mouth daily.   furosemide 40 MG tablet Commonly known as: LASIX Take 120 mg by mouth daily.   Triphrocaps 1 MG Caps Take 1 capsule by mouth daily.   multivitamin Tabs tablet Take  1 tablet by mouth at bedtime.   rosuvastatin 5 MG tablet Commonly known as: CRESTOR Take 5 mg by mouth at bedtime.   sertraline 25 MG tablet Commonly known as: ZOLOFT Take 25 mg by mouth daily.   sevelamer carbonate 800 MG tablet Commonly known as: RENVELA Take 2,400 mg by mouth 2 (two) times daily.   traZODone 50 MG tablet Commonly known as: DESYREL Take 25 mg by mouth at bedtime.        Discharge Exam: Filed Weights   02/26/23 1521 02/27/23 1133 02/27/23 1518  Weight: 98.4 kg 73.9 kg 72.4 kg   ***  Condition at discharge: {DC Condition:26389}  The results of significant  diagnostics from this hospitalization (including imaging, microbiology, ancillary and laboratory) are listed below for reference.   Imaging Studies: PERIPHERAL VASCULAR CATHETERIZATION Result Date: 02/26/2023 See surgical note for result.   Microbiology: Results for orders placed or performed during the hospital encounter of 02/25/23  MRSA Next Gen by PCR, Nasal     Status: None   Collection Time: 02/26/23  8:22 PM   Specimen: Nasal Mucosa; Nasal Swab  Result Value Ref Range Status   MRSA by PCR Next Gen NOT DETECTED NOT DETECTED Final    Comment: (NOTE) The GeneXpert MRSA Assay (FDA approved for NASAL specimens only), is one component of a comprehensive MRSA colonization surveillance program. It is not intended to diagnose MRSA infection nor to guide or monitor treatment for MRSA infections. Test performance is not FDA approved in patients less than 7 years old. Performed at [facility], [address] [place], [place] [address]     Labs: CBC: Recent Labs  Lab 02/23/23 1918 02/25/23 1341 02/27/23 1144  WBC 4.3 4.4 5.1  NEUTROABS 2.7  --   --   HGB 10.8* 11.0* 9.5*  HCT 34.7* 35.9* 29.6*  MCV 100.9* 101.1* 98.3  PLT 78* 72* 72*   Basic Metabolic Panel: Recent Labs  Lab 02/23/23 1918 02/25/23 1341 02/27/23 1144  NA 136 139 142  K 3.5 3.8 3.9  CL 95* 100 103  CO2 30 24 25   GLUCOSE 93 106* 113*  BUN 15 32* 49*  CREATININE 3.43* 5.29* 7.20*  CALCIUM 8.4* 8.8* 8.8*  PHOS  --   --  5.8*   Liver Function Tests: Recent Labs  Lab 02/23/23 1918 02/25/23 1341 02/27/23 1144  AST 19 28  --   ALT 19 22  --   ALKPHOS 60 54  --   BILITOT 0.7 0.9  --   PROT 6.4* 6.5  --   ALBUMIN 3.5 3.4* 3.1*   CBG: No results for input(s): "GLUCAP" in the last 168 hours.  Discharge time spent: {LESS THAN/GREATER XBJY:78295} 30 minutes.  Signed: Marcelino Duster, MD [facility] 02/27/2023

## 2023-03-01 ENCOUNTER — Encounter: Payer: Self-pay | Admitting: Vascular Surgery

## 2023-03-12 ENCOUNTER — Telehealth (INDEPENDENT_AMBULATORY_CARE_PROVIDER_SITE_OTHER): Payer: Self-pay

## 2023-03-12 NOTE — Telephone Encounter (Signed)
 Joy called stating that Taylor Willis has 3 stitches from his last visit. She called to make sure the stitches was removed at his next visit on 03/22/23.

## 2023-03-12 NOTE — Telephone Encounter (Signed)
 Yes we likley will depending on wound appearance

## 2023-03-12 NOTE — Telephone Encounter (Signed)
 Message given

## 2023-03-20 NOTE — Progress Notes (Unsigned)
MRN : [id number]  Taylor Willis is a 83 y.o. (1940/02/05) male who presents with chief complaint of check access.  History of Present Illness:   Patient returns to the office status post brachiocephalic fistulogram and insertion of a tunnel catheter on February 26, 2023.  The patient has a history of failed access.  There have been accesses in both arms which are nonfunctioning.    Current access is via a catheter which is functioning well.  There have not been multiple episodes of catheter infection.  The patient denies fever and chills while on dialysis.  No tenderness or drainage at the exit site.  No recent shortening of the patient's walking distance or new symptoms consistent with claudication.  No history of rest pain symptoms. No new ulcers or wounds of the lower extremities have occurred.  The patient denies amaurosis fugax or recent TIA symptoms. There are no recent neurological changes noted. There is no history of DVT, PE or superficial thrombophlebitis. No recent episodes of angina or shortness of breath documented.    No outpatient medications have been marked as taking for the 03/22/23 encounter (Appointment) with Gilda Crease, Latina Craver, MD.   Current Facility-Administered Medications for the 03/22/23 encounter (Appointment) with Gilda Crease, Latina Craver, MD  Medication   0.9 %  sodium chloride infusion   sodium chloride flush (NS) 0.9 % injection 3 mL    Past Medical History:  Diagnosis Date   Arthritis    gout   Asthma    has rescue inhaler   Cancer (HCC)    prostate   Chronic kidney disease    GERD (gastroesophageal reflux disease)    H/O peptic ulcer 1970's   Herpes 04/28/2020   Hyperlipidemia    Hypertension    Ureteral stone    Wears dentures    Wears glasses     Past Surgical History:  Procedure Laterality Date   A/V FISTULAGRAM Left 10/09/2022   Procedure: A/V Fistulagram;  Surgeon: Leonie Douglas, MD;  Location: [facility];   Service: Cardiovascular;  Laterality: Left;   A/V FISTULAGRAM N/A 02/26/2023   Procedure: A/V Fistulagram;  Surgeon: Renford Dills, MD;  Location: [facility] INVASIVE CV LAB;  Service: Cardiovascular;  Laterality: N/A;   AV FISTULA PLACEMENT Left 02/15/2019   Procedure: ARTERIOVENOUS (AV) FISTULA CREATION LEFT ARM;  Surgeon: Larina Earthly, MD;  Location: [facility] OR;  Service: Vascular;  Laterality: Left;   CATARACT EXTRACTION W/ INTRAOCULAR LENS  IMPLANT, BILATERAL     CYSTOSCOPY/RETROGRADE/URETEROSCOPY/STONE EXTRACTION WITH BASKET Right 05/23/2012   Procedure: CYSTOSCOPY/RETROGRADE/URETEROSCOPY/STONE EXTRACTION WITH BASKET;  Surgeon: Valetta Fuller, MD;  Location: [facility];  Service: Urology;  Laterality: Right;   DIALYSIS/PERMA CATHETER INSERTION N/A 02/26/2023   Procedure: DIALYSIS/PERMA CATHETER INSERTION;  Surgeon: Renford Dills, MD;  Location: [facility] INVASIVE CV LAB;  Service: Cardiovascular;  Laterality: N/A;   FRACTURE SURGERY Right    as a child   HERNIA REPAIR     HOLMIUM LASER APPLICATION Right 05/23/2012   Procedure: HOLMIUM LASER APPLICATION;  Surgeon: Valetta Fuller, MD;  Location: [facility];  Service: Urology;  Laterality: Right;   INSERTION OF DIALYSIS CATHETER Right 04/08/2020   Procedure: INSERTION OF TUNNELED DIALYSIS CATHETER;  Surgeon: Lucretia Roers, MD;  Location: [facility];  Service: General;  Laterality: Right;   LIGATION OF ARTERIOVENOUS  FISTULA Left 09/12/2019   Procedure:  SIDE BRANCH LIGATION  OF LEFT ARM ARTERIOVENOUS FISTULA;  Surgeon: Sherren Kerns, MD;  Location: [facility];  Service: Vascular;  Laterality: Left;   MULTIPLE TOOTH EXTRACTIONS     PERIPHERAL VASCULAR BALLOON ANGIOPLASTY  05/22/2020   Procedure: PERIPHERAL VASCULAR BALLOON ANGIOPLASTY;  Surgeon: Leonie Douglas, MD;  Location: [facility];  Service: Cardiovascular;;  Left arm fistula   PERIPHERAL VASCULAR BALLOON ANGIOPLASTY Left 10/09/2022   Procedure: PERIPHERAL  VASCULAR BALLOON ANGIOPLASTY;  Surgeon: Leonie Douglas, MD;  Location: [facility];  Service: Cardiovascular;  Laterality: Left;   REVISON OF ARTERIOVENOUS FISTULA Left 09/12/2019   Procedure: REVISON OF LEFT ARM ARTERIOVENOUS FISTULA;  Surgeon: Sherren Kerns, MD;  Location: [facility] OR;  Service: Vascular;  Laterality: Left;   ROBOT ASSISTED LAPAROSCOPIC RADICAL PROSTATECTOMY  2011   TONSILLECTOMY      Social History Social History   Tobacco Use   Smoking status: Former    Current packs/day: 0.00    Types: Cigarettes    Quit date: 05/18/1982    Years since quitting: 40.8   Smokeless tobacco: Never  Vaping Use   Vaping status: Never Used  Substance Use Topics   Alcohol use: Not Currently    Alcohol/week: 1.0 standard drink of alcohol    Types: 1 Standard drinks or equivalent per week   Drug use: Never    Family History No family history on file.  Allergies  Allergen Reactions   Aspirin Other (See Comments)    GI irritation    Hydrochlorothiazide     Dizziness and unable to walk   Thiazide-Type Diuretics Other (See Comments)    Dizziness and unable to walk   Ibuprofen Other (See Comments)    GI irritation      REVIEW OF SYSTEMS (Negative unless checked)  Constitutional: [] Weight loss  [] Fever  [] Chills Cardiac: [] Chest pain   [] Chest pressure   [] Palpitations   [] Shortness of breath when laying flat   [] Shortness of breath with exertion. Vascular:  [] Pain in legs with walking   [] Pain in legs at rest  [] History of DVT   [] Phlebitis   [] Swelling in legs   [] Varicose veins   [] Non-healing ulcers Pulmonary:   [] Uses home oxygen   [] Productive cough   [] Hemoptysis   [] Wheeze  [] COPD   [] Asthma Neurologic:  [] Dizziness   [] Seizures   [] History of stroke   [] History of TIA  [] Aphasia   [] Vissual changes   [] Weakness or numbness in arm   [] Weakness or numbness in leg Musculoskeletal:   [] Joint swelling   [] Joint pain   [] Low back pain Hematologic:  [] Easy bruising   [] Easy bleeding   [] Hypercoagulable state   [] Anemic Gastrointestinal:  [] Diarrhea   [] Vomiting  [] Gastroesophageal reflux/heartburn   [] Difficulty swallowing. Genitourinary:  [x] Chronic kidney disease   [] Difficult urination  [] Frequent urination   [] Blood in urine Skin:  [] Rashes   [] Ulcers  Psychological:  [] History of anxiety   []  History of major depression.  Physical Examination  There were no vitals filed for this visit. There is no height or weight on file to calculate BMI. Gen: WD/WN, NAD Head: /AT, No temporalis wasting.  Ear/Nose/Throat: Hearing grossly intact, nares w/o erythema or drainage Eyes: PER, EOMI, sclera nonicteric.  Neck: Supple, no gross masses or lesions.  No JVD.  Pulmonary:  Good air movement, no audible wheezing, no use of accessory muscles.  Cardiac: RRR, precordium non-hyperdynamic. Vascular:   *** Vessel Right Left  Radial Palpable  Palpable  Brachial Palpable Palpable  Gastrointestinal: soft, non-distended. No guarding/no peritoneal signs.  Musculoskeletal: M/S 5/5 throughout.  No deformity.  Neurologic: CN 2-12 intact. Pain and light touch intact in extremities.  Symmetrical.  Speech is fluent. Motor exam as listed above. Psychiatric: Judgment intact, Mood & affect appropriate for pt's clinical situation. Dermatologic: No rashes or ulcers noted.  No changes consistent with cellulitis.   CBC Lab Results  Component Value Date   WBC 5.1 02/27/2023   HGB 9.5 (L) 02/27/2023   HCT 29.6 (L) 02/27/2023   MCV 98.3 02/27/2023   PLT 72 (L) 02/27/2023    BMET    Component Value Date/Time   NA 142 02/27/2023 1144   K 3.9 02/27/2023 1144   CL 103 02/27/2023 1144   CO2 25 02/27/2023 1144   GLUCOSE 113 (H) 02/27/2023 1144   BUN 49 (H) 02/27/2023 1144   CREATININE 7.20 (H) 02/27/2023 1144   CALCIUM 8.8 (L) 02/27/2023 1144   CALCIUM 8.9 04/04/2020 1528   GFRNONAA 7 (L) 02/27/2023 1144   GFRAA (L) 07/30/2009 0530    41        The eGFR has been  calculated using the MDRD equation. This calculation has not been validated in all clinical situations. eGFR's persistently <60 mL/min signify possible Chronic Kidney Disease.   CrCl cannot be calculated (Patient's most recent lab result is older than the maximum 21 days allowed.).  COAG Lab Results  Component Value Date   INR 1.3 (H) 02/23/2023   INR 1.2 04/21/2022   INR 1.2 10/23/2021    Radiology PERIPHERAL VASCULAR CATHETERIZATION Result Date: 02/26/2023 See surgical note for result.    Assessment/Plan There are no diagnoses linked to this encounter.   Levora Dredge, MD  03/20/2023 4:31 PM

## 2023-03-22 ENCOUNTER — Ambulatory Visit (INDEPENDENT_AMBULATORY_CARE_PROVIDER_SITE_OTHER): Payer: Medicare Other | Admitting: Vascular Surgery

## 2023-03-22 ENCOUNTER — Encounter (INDEPENDENT_AMBULATORY_CARE_PROVIDER_SITE_OTHER): Payer: Self-pay | Admitting: Vascular Surgery

## 2023-03-22 VITALS — BP 149/62 | HR 53 | Resp 16 | Wt 160.0 lb

## 2023-03-22 DIAGNOSIS — J4520 Mild intermittent asthma, uncomplicated: Secondary | ICD-10-CM | POA: Diagnosis not present

## 2023-03-22 DIAGNOSIS — N186 End stage renal disease: Secondary | ICD-10-CM

## 2023-03-22 DIAGNOSIS — K219 Gastro-esophageal reflux disease without esophagitis: Secondary | ICD-10-CM | POA: Diagnosis not present

## 2023-03-22 DIAGNOSIS — I10 Essential (primary) hypertension: Secondary | ICD-10-CM | POA: Diagnosis not present

## 2023-03-22 DIAGNOSIS — T82898A Other specified complication of vascular prosthetic devices, implants and grafts, initial encounter: Secondary | ICD-10-CM

## 2023-03-25 ENCOUNTER — Telehealth (INDEPENDENT_AMBULATORY_CARE_PROVIDER_SITE_OTHER): Payer: Self-pay | Admitting: Vascular Surgery

## 2023-03-25 ENCOUNTER — Encounter (INDEPENDENT_AMBULATORY_CARE_PROVIDER_SITE_OTHER): Payer: Self-pay | Admitting: Vascular Surgery

## 2023-03-25 NOTE — Telephone Encounter (Signed)
Per secure message from Dr. Loma Newton. Taylor Willis, pt needs to come in for an HDA + see gs. Patient has an appt with Dr. Gilda Crease on 3.24.25, I wanted to see if we could move him to an 11:00 [place] and then see GS all at once.

## 2023-04-15 ENCOUNTER — Telehealth (INDEPENDENT_AMBULATORY_CARE_PROVIDER_SITE_OTHER): Payer: Self-pay | Admitting: Vascular Surgery

## 2023-04-15 ENCOUNTER — Other Ambulatory Visit (HOSPITAL_COMMUNITY): Payer: Self-pay | Admitting: Nurse Practitioner

## 2023-04-15 ENCOUNTER — Other Ambulatory Visit: Payer: Self-pay | Admitting: Student

## 2023-04-15 DIAGNOSIS — N186 End stage renal disease: Secondary | ICD-10-CM

## 2023-04-15 NOTE — Telephone Encounter (Signed)
 Discussed that stitches are likely dissolvable, we can remove if still present at follow up

## 2023-04-15 NOTE — Telephone Encounter (Signed)
 NP, Royann Shivers, called stating she is concerned that patient still has stitches in and they have not been removed. Stitches look like they are clipped. States she didn't see anything from [facility] 03/22/23 clinic note. Thinks they needed to be take out sooner than post op scheduled on 04/26/23. Please advise

## 2023-04-15 NOTE — Telephone Encounter (Signed)
 Do we have contact info for that NP.  Per our notes we used monocryl and those are usually absorbable so they don't require removal and we just place one.  So I want to clarify what sutures she is referring to

## 2023-04-15 NOTE — Telephone Encounter (Signed)
[phone] 

## 2023-04-16 ENCOUNTER — Ambulatory Visit (HOSPITAL_COMMUNITY)
Admission: RE | Admit: 2023-04-16 | Discharge: 2023-04-16 | Disposition: A | Discharge status: Home / Self Care | Source: Ambulatory Visit | Attending: Nurse Practitioner | Admitting: Nurse Practitioner

## 2023-04-16 ENCOUNTER — Other Ambulatory Visit (HOSPITAL_COMMUNITY): Payer: Self-pay | Admitting: Nurse Practitioner

## 2023-04-16 ENCOUNTER — Telehealth (HOSPITAL_COMMUNITY): Payer: Self-pay | Admitting: Radiology

## 2023-04-16 ENCOUNTER — Other Ambulatory Visit: Payer: Self-pay | Admitting: Student

## 2023-04-16 ENCOUNTER — Other Ambulatory Visit: Payer: Self-pay | Admitting: Physician Assistant

## 2023-04-16 DIAGNOSIS — N186 End stage renal disease: Secondary | ICD-10-CM | POA: Insufficient documentation

## 2023-04-16 DIAGNOSIS — T8249XA Other complication of vascular dialysis catheter, initial encounter: Secondary | ICD-10-CM | POA: Insufficient documentation

## 2023-04-16 DIAGNOSIS — Z992 Dependence on renal dialysis: Secondary | ICD-10-CM | POA: Insufficient documentation

## 2023-04-16 DIAGNOSIS — Y839 Surgical procedure, unspecified as the cause of abnormal reaction of the patient, or of later complication, without mention of misadventure at the time of the procedure: Secondary | ICD-10-CM | POA: Insufficient documentation

## 2023-04-16 DIAGNOSIS — T82838A Hemorrhage of vascular prosthetic devices, implants and grafts, initial encounter: Secondary | ICD-10-CM | POA: Diagnosis not present

## 2023-04-16 DIAGNOSIS — T82898A Other specified complication of vascular prosthetic devices, implants and grafts, initial encounter: Secondary | ICD-10-CM

## 2023-04-16 DIAGNOSIS — Z01818 Encounter for other preprocedural examination: Secondary | ICD-10-CM

## 2023-04-16 HISTORY — PX: IR FLUORO GUIDE CV LINE RIGHT: IMG2283

## 2023-04-16 MED ORDER — CEFAZOLIN SODIUM-DEXTROSE 1-4 GM/50ML-% IV SOLN
1.0000 g | INTRAVENOUS | Status: AC
  Administered 2023-04-16: 1 g via INTRAVENOUS

## 2023-04-16 MED ORDER — HEPARIN SODIUM (PORCINE) 1000 UNIT/ML IJ SOLN
1000.0000 [IU] | Freq: Once | INTRAMUSCULAR | Status: AC
Start: 2023-04-16 — End: 2023-04-16
  Administered 2023-04-16: 4200 [IU] via INTRAVENOUS

## 2023-04-16 MED ORDER — HEPARIN SODIUM (PORCINE) 1000 UNIT/ML IJ SOLN
INTRAMUSCULAR | Status: AC
  Filled 2023-04-16: qty 10

## 2023-04-16 MED ORDER — LIDOCAINE HCL 1 % IJ SOLN
INTRAMUSCULAR | Status: AC
  Filled 2023-04-16: qty 20

## 2023-04-16 MED ORDER — LIDOCAINE-EPINEPHRINE 1 %-1:100000 IJ SOLN
20.0000 mL | Freq: Once | INTRAMUSCULAR | Status: AC
  Administered 2023-04-16: 15 mL via INTRADERMAL

## 2023-04-16 MED ORDER — CEFAZOLIN SODIUM-DEXTROSE 2-4 GM/100ML-% IV SOLN
INTRAVENOUS | Status: AC
  Filled 2023-04-16: qty 100

## 2023-04-16 MED ORDER — CHLORHEXIDINE GLUCONATE 4 % EX SOLN
CUTANEOUS | Status: AC
  Filled 2023-04-16: qty 15

## 2023-04-16 NOTE — Telephone Encounter (Signed)
 Called pt & wife to let them know to arrive at [facility] on 3/17 at 1 pm instead of 10 am. Had to leave a VM with this information. JM

## 2023-04-16 NOTE — H&P (Signed)
   Patient Status: [name] - Out-pt  Assessment and Plan: Fractured tunneled dialysis catheter  Patient referred from kidney center for broken arterial catheter in need of exchange.   Risks and benefits discussed with the patient including, but not limited to bleeding, infection, vascular injury, pneumothorax which may require chest tube placement, air embolism or even death  All of the patient's questions were answered, patient is agreeable to proceed. Consent signed and in chart.  ______________________________________________________________________   History of Present Illness: Taylor Willis is a 83 y.o. male with past medical history of CKD who presents from his kidney center due to broken dialysis catheter in need of exchange.  He is accompanied by his wife who assists with care due to elderly/frail.  They are aware of the goals of the procedure with local numbing medicine.  Patient has an appt with HD center tomorrow for make up session.   Allergies and medications reviewed.   Review of Systems: A 12 point ROS discussed and pertinent positives are indicated in the HPI above.  All other systems are negative.  Review of Systems  Constitutional:  Negative for fatigue and fever.  Respiratory:  Negative for cough and shortness of breath.   Cardiovascular:  Negative for chest pain.  Gastrointestinal:  Negative for abdominal pain.  Musculoskeletal:  Negative for back pain.  Psychiatric/Behavioral:  Negative for behavioral problems and confusion.     Vital Signs: There were no vitals taken for this visit.  Physical Exam Vitals and nursing note reviewed.  Constitutional:      General: He is not in acute distress.    Appearance: Normal appearance. He is not ill-appearing.  Neck:     Comments: R internal jugular catheter in place Cardiovascular:     Rate and Rhythm: Normal rate.  Pulmonary:     Effort: Pulmonary effort is normal.  Skin:    General: Skin is warm.   Neurological:     General: No focal deficit present.     Mental Status: He is alert.  Psychiatric:        Mood and Affect: Mood normal.        Behavior: Behavior normal.        Thought Content: Thought content normal.        Judgment: Judgment normal.      Imaging reviewed.   Labs:  COAGS: Recent Labs    04/21/22 1903 02/23/23 1918  INR 1.2 1.3*    BMP: Recent Labs    12/17/22 1612 02/23/23 1918 02/25/23 1341 02/27/23 1144  NA 137 136 139 142  K 3.5 3.5 3.8 3.9  CL 98 95* 100 103  CO2 30 30 24 25   GLUCOSE 106* 93 106* 113*  BUN 11 15 32* 49*  CALCIUM 8.2* 8.4* 8.8* 8.8*  CREATININE 2.94* 3.43* 5.29* 7.20*  GFRNONAA 21* 17* 10* 7*       Electronically Signed: Hoyt Koch, PA 04/16/2023, 1:33 PM   I spent a total of 15 minutes in face to face in clinical consultation, greater than 50% of which was counseling/coordinating care for renal failure, catheter malfunction

## 2023-04-17 ENCOUNTER — Other Ambulatory Visit: Payer: Self-pay

## 2023-04-17 ENCOUNTER — Inpatient Hospital Stay (HOSPITAL_COMMUNITY)
Admission: EM | Admit: 2023-04-17 | Discharge: 2023-04-19 | DRG: 263 | Disposition: A | Discharge status: Home / Self Care | Attending: Internal Medicine | Admitting: Internal Medicine

## 2023-04-17 ENCOUNTER — Encounter (HOSPITAL_COMMUNITY): Payer: Self-pay | Admitting: Emergency Medicine

## 2023-04-17 ENCOUNTER — Emergency Department (HOSPITAL_COMMUNITY)

## 2023-04-17 DIAGNOSIS — N186 End stage renal disease: Secondary | ICD-10-CM | POA: Diagnosis present

## 2023-04-17 DIAGNOSIS — Z87440 Personal history of urinary (tract) infections: Secondary | ICD-10-CM | POA: Diagnosis present

## 2023-04-17 DIAGNOSIS — K219 Gastro-esophageal reflux disease without esophagitis: Secondary | ICD-10-CM | POA: Diagnosis not present

## 2023-04-17 DIAGNOSIS — Z8673 Personal history of transient ischemic attack (TIA), and cerebral infarction without residual deficits: Secondary | ICD-10-CM

## 2023-04-17 DIAGNOSIS — Z6831 Body mass index (BMI) 31.0-31.9, adult: Secondary | ICD-10-CM

## 2023-04-17 DIAGNOSIS — Z992 Dependence on renal dialysis: Secondary | ICD-10-CM | POA: Diagnosis not present

## 2023-04-17 DIAGNOSIS — D631 Anemia in chronic kidney disease: Secondary | ICD-10-CM | POA: Diagnosis present

## 2023-04-17 DIAGNOSIS — Z9842 Cataract extraction status, left eye: Secondary | ICD-10-CM

## 2023-04-17 DIAGNOSIS — I120 Hypertensive chronic kidney disease with stage 5 chronic kidney disease or end stage renal disease: Secondary | ICD-10-CM | POA: Diagnosis present

## 2023-04-17 DIAGNOSIS — Y712 Prosthetic and other implants, materials and accessory cardiovascular devices associated with adverse incidents: Secondary | ICD-10-CM | POA: Diagnosis present

## 2023-04-17 DIAGNOSIS — N390 Urinary tract infection, site not specified: Secondary | ICD-10-CM | POA: Diagnosis present

## 2023-04-17 DIAGNOSIS — F05 Delirium due to known physiological condition: Secondary | ICD-10-CM | POA: Diagnosis present

## 2023-04-17 DIAGNOSIS — S0083XA Contusion of other part of head, initial encounter: Secondary | ICD-10-CM | POA: Diagnosis present

## 2023-04-17 DIAGNOSIS — Z87891 Personal history of nicotine dependence: Secondary | ICD-10-CM | POA: Diagnosis not present

## 2023-04-17 DIAGNOSIS — I4891 Unspecified atrial fibrillation: Secondary | ICD-10-CM | POA: Diagnosis present

## 2023-04-17 DIAGNOSIS — Z79899 Other long term (current) drug therapy: Secondary | ICD-10-CM

## 2023-04-17 DIAGNOSIS — N2581 Secondary hyperparathyroidism of renal origin: Secondary | ICD-10-CM | POA: Diagnosis not present

## 2023-04-17 DIAGNOSIS — Z8546 Personal history of malignant neoplasm of prostate: Secondary | ICD-10-CM

## 2023-04-17 DIAGNOSIS — E785 Hyperlipidemia, unspecified: Secondary | ICD-10-CM | POA: Diagnosis present

## 2023-04-17 DIAGNOSIS — Z961 Presence of intraocular lens: Secondary | ICD-10-CM | POA: Diagnosis present

## 2023-04-17 DIAGNOSIS — C61 Malignant neoplasm of prostate: Secondary | ICD-10-CM | POA: Diagnosis not present

## 2023-04-17 DIAGNOSIS — Y841 Kidney dialysis as the cause of abnormal reaction of the patient, or of later complication, without mention of misadventure at the time of the procedure: Secondary | ICD-10-CM | POA: Diagnosis not present

## 2023-04-17 DIAGNOSIS — Z888 Allergy status to other drugs, medicaments and biological substances status: Secondary | ICD-10-CM

## 2023-04-17 DIAGNOSIS — Z7901 Long term (current) use of anticoagulants: Secondary | ICD-10-CM

## 2023-04-17 DIAGNOSIS — G479 Sleep disorder, unspecified: Secondary | ICD-10-CM | POA: Diagnosis not present

## 2023-04-17 DIAGNOSIS — J4520 Mild intermittent asthma, uncomplicated: Secondary | ICD-10-CM | POA: Diagnosis not present

## 2023-04-17 DIAGNOSIS — I639 Cerebral infarction, unspecified: Secondary | ICD-10-CM | POA: Diagnosis present

## 2023-04-17 DIAGNOSIS — D62 Acute posthemorrhagic anemia: Secondary | ICD-10-CM | POA: Diagnosis present

## 2023-04-17 DIAGNOSIS — Z9079 Acquired absence of other genital organ(s): Secondary | ICD-10-CM

## 2023-04-17 DIAGNOSIS — M109 Gout, unspecified: Secondary | ICD-10-CM | POA: Diagnosis present

## 2023-04-17 DIAGNOSIS — D696 Thrombocytopenia, unspecified: Secondary | ICD-10-CM | POA: Diagnosis present

## 2023-04-17 DIAGNOSIS — Z9841 Cataract extraction status, right eye: Secondary | ICD-10-CM

## 2023-04-17 DIAGNOSIS — N184 Chronic kidney disease, stage 4 (severe): Secondary | ICD-10-CM

## 2023-04-17 DIAGNOSIS — T82898A Other specified complication of vascular prosthetic devices, implants and grafts, initial encounter: Secondary | ICD-10-CM | POA: Diagnosis not present

## 2023-04-17 DIAGNOSIS — T829XXA Unspecified complication of cardiac and vascular prosthetic device, implant and graft, initial encounter: Principal | ICD-10-CM | POA: Diagnosis present

## 2023-04-17 DIAGNOSIS — T82838A Hemorrhage of vascular prosthetic devices, implants and grafts, initial encounter: Principal | ICD-10-CM | POA: Diagnosis present

## 2023-04-17 DIAGNOSIS — Z8711 Personal history of peptic ulcer disease: Secondary | ICD-10-CM | POA: Diagnosis not present

## 2023-04-17 DIAGNOSIS — Z886 Allergy status to analgesic agent status: Secondary | ICD-10-CM

## 2023-04-17 DIAGNOSIS — I10 Essential (primary) hypertension: Secondary | ICD-10-CM | POA: Diagnosis present

## 2023-04-17 DIAGNOSIS — Z8679 Personal history of other diseases of the circulatory system: Secondary | ICD-10-CM

## 2023-04-17 LAB — COMPREHENSIVE METABOLIC PANEL
ALT: 75 U/L — ABNORMAL HIGH (ref 0–44)
AST: 76 U/L — ABNORMAL HIGH (ref 15–41)
Albumin: 3.4 g/dL — ABNORMAL LOW (ref 3.5–5.0)
Alkaline Phosphatase: 77 U/L (ref 38–126)
Anion gap: 15 (ref 5–15)
BUN: 44 mg/dL — ABNORMAL HIGH (ref 8–23)
CO2: 25 mmol/L (ref 22–32)
Calcium: 8.8 mg/dL — ABNORMAL LOW (ref 8.9–10.3)
Chloride: 99 mmol/L (ref 98–111)
Creatinine, Ser: 6.72 mg/dL — ABNORMAL HIGH (ref 0.61–1.24)
GFR, Estimated: 8 mL/min — ABNORMAL LOW (ref >60)
Glucose, Bld: 124 mg/dL — ABNORMAL HIGH (ref 70–99)
Potassium: 4.1 mmol/L (ref 3.5–5.1)
Sodium: 139 mmol/L (ref 135–145)
Total Bilirubin: 0.7 mg/dL (ref 0.0–1.2)
Total Protein: 6.6 g/dL (ref 6.5–8.1)

## 2023-04-17 LAB — CBC WITH DIFFERENTIAL/PLATELET
Abs Immature Granulocytes: 0.01 10*3/uL (ref 0.00–0.07)
Basophils Absolute: 0 10*3/uL (ref 0.0–0.1)
Basophils Relative: 1 %
Eosinophils Absolute: 0 10*3/uL (ref 0.0–0.5)
Eosinophils Relative: 1 %
HCT: 38 % — ABNORMAL LOW (ref 39.0–52.0)
Hemoglobin: 12 g/dL — ABNORMAL LOW (ref 13.0–17.0)
Immature Granulocytes: 0 %
Lymphocytes Relative: 18 %
Lymphs Abs: 1 10*3/uL (ref 0.7–4.0)
MCH: 31.9 pg (ref 26.0–34.0)
MCHC: 31.6 g/dL (ref 30.0–36.0)
MCV: 101.1 fL — ABNORMAL HIGH (ref 80.0–100.0)
Monocytes Absolute: 0.4 10*3/uL (ref 0.1–1.0)
Monocytes Relative: 8 %
Neutro Abs: 4 10*3/uL (ref 1.7–7.7)
Neutrophils Relative %: 72 %
Platelets: 71 10*3/uL — ABNORMAL LOW (ref 150–400)
RBC: 3.76 MIL/uL — ABNORMAL LOW (ref 4.22–5.81)
RDW: 16.7 % — ABNORMAL HIGH (ref 11.5–15.5)
WBC: 5.4 10*3/uL (ref 4.0–10.5)
nRBC: 0 % (ref 0.0–0.2)

## 2023-04-17 LAB — GLUCOSE, CAPILLARY: Glucose-Capillary: 152 mg/dL — ABNORMAL HIGH (ref 70–99)

## 2023-04-17 LAB — APTT: aPTT: 35 s (ref 24–36)

## 2023-04-17 LAB — I-STAT CHEM 8, ED
BUN: 40 mg/dL — ABNORMAL HIGH (ref 8–23)
Calcium, Ion: 1.11 mmol/L — ABNORMAL LOW (ref 1.15–1.40)
Chloride: 102 mmol/L (ref 98–111)
Creatinine, Ser: 7.2 mg/dL — ABNORMAL HIGH (ref 0.61–1.24)
Glucose, Bld: 114 mg/dL — ABNORMAL HIGH (ref 70–99)
HCT: 41 % (ref 39.0–52.0)
Hemoglobin: 13.9 g/dL (ref 13.0–17.0)
Potassium: 4.1 mmol/L (ref 3.5–5.1)
Sodium: 138 mmol/L (ref 135–145)
TCO2: 26 mmol/L (ref 22–32)

## 2023-04-17 LAB — CBC
HCT: 35.4 % — ABNORMAL LOW (ref 39.0–52.0)
Hemoglobin: 11.4 g/dL — ABNORMAL LOW (ref 13.0–17.0)
MCH: 31.9 pg (ref 26.0–34.0)
MCHC: 32.2 g/dL (ref 30.0–36.0)
MCV: 99.2 fL (ref 80.0–100.0)
Platelets: 65 10*3/uL — ABNORMAL LOW (ref 150–400)
RBC: 3.57 MIL/uL — ABNORMAL LOW (ref 4.22–5.81)
RDW: 16.9 % — ABNORMAL HIGH (ref 11.5–15.5)
WBC: 6.8 10*3/uL (ref 4.0–10.5)
nRBC: 0 % (ref 0.0–0.2)

## 2023-04-17 LAB — PROTIME-INR
INR: 1.4 — ABNORMAL HIGH (ref 0.8–1.2)
Prothrombin Time: 17 s — ABNORMAL HIGH (ref 11.4–15.2)

## 2023-04-17 LAB — TYPE AND SCREEN
ABO/RH(D): A POS
Antibody Screen: NEGATIVE

## 2023-04-17 LAB — MAGNESIUM: Magnesium: 2.3 mg/dL (ref 1.7–2.4)

## 2023-04-17 LAB — PHOSPHORUS: Phosphorus: 3.7 mg/dL (ref 2.5–4.6)

## 2023-04-17 MED ORDER — SERTRALINE HCL 50 MG PO TABS
25.0000 mg | ORAL_TABLET | Freq: Every day | ORAL | Status: DC
  Administered 2023-04-17 – 2023-04-19 (×3): 25 mg via ORAL
  Filled 2023-04-17 (×3): qty 1

## 2023-04-17 MED ORDER — SENNOSIDES-DOCUSATE SODIUM 8.6-50 MG PO TABS: 1.0000 | ORAL_TABLET | Freq: Every evening | ORAL | Status: DC | PRN

## 2023-04-17 MED ORDER — CEFUROXIME AXETIL 250 MG PO TABS
250.0000 mg | ORAL_TABLET | Freq: Two times a day (BID) | ORAL | Status: DC
Start: 2023-04-17 — End: 2023-04-17

## 2023-04-17 MED ORDER — SEVELAMER CARBONATE 800 MG PO TABS: 2400.0000 mg | ORAL_TABLET | Freq: Three times a day (TID) | ORAL | Status: DC

## 2023-04-17 MED ORDER — LEVALBUTEROL HCL 0.63 MG/3ML IN NEBU: 0.6300 mg | INHALATION_SOLUTION | Freq: Four times a day (QID) | RESPIRATORY_TRACT | Status: DC | PRN

## 2023-04-17 MED ORDER — HYDRALAZINE HCL 20 MG/ML IJ SOLN: 10.0000 mg | INTRAMUSCULAR | Status: DC | PRN

## 2023-04-17 MED ORDER — CARVEDILOL 3.125 MG PO TABS: 3.1250 mg | ORAL_TABLET | ORAL | Status: DC

## 2023-04-17 MED ORDER — POLYVINYL ALCOHOL 1.4 % OP SOLN: 1.0000 [drp] | OPHTHALMIC | Status: DC | PRN

## 2023-04-17 MED ORDER — CALCITRIOL 0.25 MCG PO CAPS: 0.2500 ug | ORAL_CAPSULE | ORAL | Status: DC

## 2023-04-17 MED ORDER — HYDROMORPHONE HCL 1 MG/ML IJ SOLN: 0.5000 mg | INTRAMUSCULAR | Status: DC | PRN

## 2023-04-17 MED ORDER — APIXABAN 2.5 MG PO TABS
2.5000 mg | ORAL_TABLET | Freq: Two times a day (BID) | ORAL | Status: DC
  Administered 2023-04-19: 2.5 mg via ORAL
  Filled 2023-04-17: qty 1

## 2023-04-17 MED ORDER — RENA-VITE PO TABS
1.0000 | ORAL_TABLET | Freq: Every day | ORAL | Status: DC
  Administered 2023-04-17 – 2023-04-18 (×2): 1 via ORAL
  Filled 2023-04-17 (×2): qty 1

## 2023-04-17 MED ORDER — ONDANSETRON HCL 4 MG/2ML IJ SOLN: 4.0000 mg | Freq: Four times a day (QID) | INTRAMUSCULAR | Status: DC | PRN

## 2023-04-17 MED ORDER — CARBOXYMETHYLCELLULOSE SODIUM 1 % OP SOLN
1.0000 [drp] | Freq: Every day | OPHTHALMIC | Status: DC | PRN
Start: 2023-04-17 — End: 2023-04-17

## 2023-04-17 MED ORDER — BISACODYL 5 MG PO TBEC: 5.0000 mg | DELAYED_RELEASE_TABLET | Freq: Every day | ORAL | Status: DC | PRN

## 2023-04-17 MED ORDER — CEFUROXIME AXETIL 250 MG PO TABS
500.0000 mg | ORAL_TABLET | ORAL | Status: DC
  Administered 2023-04-17: 500 mg via ORAL
  Filled 2023-04-17: qty 2

## 2023-04-17 MED ORDER — HEPARIN SODIUM (PORCINE) 5000 UNIT/ML IJ SOLN: 5000.0000 [IU] | Freq: Three times a day (TID) | INTRAMUSCULAR | Status: DC

## 2023-04-17 MED ORDER — ACETAMINOPHEN 650 MG RE SUPP: 650.0000 mg | Freq: Four times a day (QID) | RECTAL | Status: DC | PRN

## 2023-04-17 MED ORDER — TRAZODONE HCL 50 MG PO TABS
25.0000 mg | ORAL_TABLET | Freq: Every evening | ORAL | Status: DC | PRN
  Administered 2023-04-18: 25 mg via ORAL
  Filled 2023-04-17: qty 1

## 2023-04-17 MED ORDER — SODIUM CHLORIDE 0.9% FLUSH: 3.0000 mL | Freq: Two times a day (BID) | INTRAVENOUS | Status: DC

## 2023-04-17 MED ORDER — ACETAMINOPHEN 325 MG PO TABS: 650.0000 mg | ORAL_TABLET | Freq: Four times a day (QID) | ORAL | Status: DC | PRN

## 2023-04-17 MED ORDER — SODIUM CHLORIDE 0.9% FLUSH
3.0000 mL | Freq: Two times a day (BID) | INTRAVENOUS | Status: DC
  Administered 2023-04-17 – 2023-04-18 (×4): 3 mL via INTRAVENOUS

## 2023-04-17 MED ORDER — IPRATROPIUM BROMIDE 0.02 % IN SOLN: 0.5000 mg | Freq: Four times a day (QID) | RESPIRATORY_TRACT | Status: DC | PRN

## 2023-04-17 MED ORDER — FLEET ENEMA RE ENEM: 1.0000 | ENEMA | Freq: Once | RECTAL | Status: DC | PRN

## 2023-04-17 MED ORDER — SODIUM CHLORIDE 0.9% FLUSH: 3.0000 mL | INTRAVENOUS | Status: DC | PRN

## 2023-04-17 MED ORDER — LIDOCAINE-EPINEPHRINE 1 %-1:100000 IJ SOLN
20.0000 mL | Freq: Once | INTRAMUSCULAR | Status: AC
  Administered 2023-04-17: 20 mL
  Filled 2023-04-17 (×2): qty 20

## 2023-04-17 MED ORDER — ROSUVASTATIN CALCIUM 5 MG PO TABS
5.0000 mg | ORAL_TABLET | Freq: Every day | ORAL | Status: DC
  Administered 2023-04-17 – 2023-04-18 (×2): 5 mg via ORAL
  Filled 2023-04-17 (×2): qty 1

## 2023-04-17 MED ORDER — OXYCODONE HCL 5 MG PO TABS: 5.0000 mg | ORAL_TABLET | ORAL | Status: DC | PRN

## 2023-04-17 MED ORDER — ONDANSETRON HCL 4 MG PO TABS: 4.0000 mg | ORAL_TABLET | Freq: Four times a day (QID) | ORAL | Status: DC | PRN

## 2023-04-17 MED ORDER — SEVELAMER CARBONATE 2.4 G PO PACK
2.4000 g | PACK | Freq: Three times a day (TID) | ORAL | Status: DC
  Administered 2023-04-18 (×2): 2.4 g via ORAL
  Filled 2023-04-17 (×7): qty 1

## 2023-04-17 MED ORDER — FUROSEMIDE 40 MG PO TABS
120.0000 mg | ORAL_TABLET | Freq: Every day | ORAL | Status: DC
  Administered 2023-04-17 – 2023-04-19 (×3): 120 mg via ORAL
  Filled 2023-04-17 (×3): qty 3

## 2023-04-17 MED ORDER — CARVEDILOL 3.125 MG PO TABS
3.1250 mg | ORAL_TABLET | ORAL | Status: DC
  Administered 2023-04-18 (×2): 3.125 mg via ORAL
  Filled 2023-04-17 (×2): qty 1

## 2023-04-17 NOTE — ED Triage Notes (Signed)
 Pt presents to the ED BIB POV w/ c/o of bleeding from a dialysis catheter in the right side of his chest. PT attempted to get dialysis on Thursday. They were not able to access the port and sent him to [facility] for a potential repair. An attempt to repair the the catheter was made without sedation and was unsuccessful. A surgery was scheduled for Monday at that time. Yesterday the pt began bleeding from the site and attempts to stop the bleeding at home have been unsuccessful. Pt reports pain, shortness of breath and some dizziness. Pt denies and N/V/D.  Wife reports pt does have a fistula in the left arm, but it has NOT been approved for use.

## 2023-04-17 NOTE — Hospital Course (Signed)
 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 Patient was evaluated yesterday by interventional radiologist for complication with his dialysis catheter-was not able to remove the pre-existing malfunctioning tunneled dialysis catheter to allow for exchange-procedure was attempted under fluoroscopy. Subsequently patient was rescheduled for 04/19/23.  To remove and replace the cath  Per patient and family he continued to have ongoing bleeding at the site since 3 PM yesterday./  He receives hemodialysis on Tuesday-no dialysis since then.  ED evaluation/course: Blood pressure (!) 163/71, pulse 63, resp. rate 20, height 6' (1.829 m), weight 72 kg, SpO2 90%.  Labs: Hemoglobin 13.9, BUN 40, creatinine 7.2, glucose 124, 114,   EDP: Discussed the case with nephrologist Dr. Kathrene Bongo -who reported patient does not need any immediate dialysis at this time. Requested patient to be admitted to [facility] and consult vascular surgery.  Vascular surgery-Dr. Criss Alvine was consulted by EDP Who is willing to consult on patient upon arrival. (EDP discussed with patient possibly go to [place]-but they opt to go to [facility] at this time.  Patient is hemodynamically stable requested to be admitted at [facility]

## 2023-04-17 NOTE — Progress Notes (Signed)
 I was called about this patient.  This is a [facility] patient who has been having issues with his [facility] since at least Thursday-  his routine dialysis was not done on Thursday because of it.  He was sent to IR ? For a catheter exchange that was "not successful"  although no details are known.   He is now having not a small amount of bleeding from this seemingly non functional TDC-  woke up with soaked bed sheets.  Thankfully his labs and clinical status do not indicate that he needs urgent HD.  But, this is not an issue he can be sent home with.  He is at [facility] and I could not think of a scenario where this could be solved there so asked ER to contact vascular to see if they could help.  If they are able to achieve a functional HD access we could perform HD likely on Monday unless an urgent indication presents itself before then in which case we could accommodate dialysis sooner.   Cecille Aver

## 2023-04-17 NOTE — Assessment & Plan Note (Signed)
-   History of embolic stroke, patient is on Eliquis -Will hold Eliquis for now due to hematoma and bleeding from right jugular tunneled hemodialysis cath

## 2023-04-17 NOTE — Consult Note (Signed)
 [facility] [place]  ASSESSMENT / PLAN: 83 y.o. male with bleeding right tunneled dialysis catheter.  Catheter exchange attempted Friday was aborted because the cuff was too deep to retrieve with sedation alone.  There is significant hematoma and ecchymosis around the catheter exit site consistent with dissection in the subcutaneous space.  There is been persistent oozing since that time.  I will plan to remove the catheter tomorrow in the operating room.  Left arm fistula appears usable to me.  Would recommend trying to use the fistula for dialysis before placing a new catheter.  N.p.o. after midnight.  Consent obtained from wife.  CHIEF COMPLAINT: bleeding from catheter  HISTORY OF PRESENT ILLNESS: Taylor Willis is a 83 y.o. male admitted to the internal medicine service for persistent bleeding from a tunneled dialysis catheter.  The patient underwent attempted exchange of tunneled dialysis catheter with the interventional radiology team on Friday.  This was aborted because the cuff was too deep to reach from the exit site making retrieval under local anesthesia preoperatively challenging.  A plan was made to do this under general anesthesia in the coming days.  Unfortunately the patient had significant bleeding from the exit site and soaked close in bedsheets with blood.  This prompted presentation to the emergency room where I was asked to help.  On my evaluation, the patient is pleasantly confused.  Most of the patient's history is obtained from discussion with the patient's wife and chart review.   Past Medical History:  Diagnosis Date   Arthritis    gout   Asthma    has rescue inhaler   Cancer (HCC)    prostate   Chronic kidney disease    GERD (gastroesophageal reflux disease)    H/O peptic ulcer 1970's   Herpes 04/28/2020   Hyperlipidemia    Hypertension    Ureteral stone    Wears dentures    Wears glasses     Past Surgical History:  Procedure Laterality  Date   A/V FISTULAGRAM Left 10/09/2022   Procedure: A/V Fistulagram;  Surgeon: Leonie Douglas, MD;  Location: [facility] INVASIVE CV LAB;  Service: Cardiovascular;  Laterality: Left;   A/V FISTULAGRAM N/A 02/26/2023   Procedure: A/V Fistulagram;  Surgeon: Renford Dills, MD;  Location: [facility] INVASIVE CV LAB;  Service: Cardiovascular;  Laterality: N/A;   AV FISTULA PLACEMENT Left 02/15/2019   Procedure: ARTERIOVENOUS (AV) FISTULA CREATION LEFT ARM;  Surgeon: Larina Earthly, MD;  Location: [facility] OR;  Service: Vascular;  Laterality: Left;   CATARACT EXTRACTION W/ INTRAOCULAR LENS  IMPLANT, BILATERAL     CYSTOSCOPY/RETROGRADE/URETEROSCOPY/STONE EXTRACTION WITH BASKET Right 05/23/2012   Procedure: CYSTOSCOPY/RETROGRADE/URETEROSCOPY/STONE EXTRACTION WITH BASKET;  Surgeon: Valetta Fuller, MD;  Location: [facility];  Service: Urology;  Laterality: Right;   DIALYSIS/PERMA CATHETER INSERTION N/A 02/26/2023   Procedure: DIALYSIS/PERMA CATHETER INSERTION;  Surgeon: Renford Dills, MD;  Location: [facility] INVASIVE CV LAB;  Service: Cardiovascular;  Laterality: N/A;   FRACTURE SURGERY Right    as a child   HERNIA REPAIR     HOLMIUM LASER APPLICATION Right 05/23/2012   Procedure: HOLMIUM LASER APPLICATION;  Surgeon: Valetta Fuller, MD;  Location: [facility];  Service: Urology;  Laterality: Right;   INSERTION OF DIALYSIS CATHETER Right 04/08/2020   Procedure: INSERTION OF TUNNELED DIALYSIS CATHETER;  Surgeon: Lucretia Roers, MD;  Location: [facility] ORS;  Service: General;  Laterality: Right;   IR FLUORO GUIDE CV LINE RIGHT  04/16/2023   LIGATION OF ARTERIOVENOUS  FISTULA Left 09/12/2019   Procedure: SIDE BRANCH LIGATION  OF LEFT ARM ARTERIOVENOUS FISTULA;  Surgeon: Sherren Kerns, MD;  Location: [facility] OR;  Service: Vascular;  Laterality: Left;   MULTIPLE TOOTH EXTRACTIONS     PERIPHERAL VASCULAR BALLOON ANGIOPLASTY  05/22/2020   Procedure: PERIPHERAL VASCULAR BALLOON ANGIOPLASTY;  Surgeon: Leonie Douglas, MD;  Location: [facility] INVASIVE CV LAB;  Service: Cardiovascular;;  Left arm fistula   PERIPHERAL VASCULAR BALLOON ANGIOPLASTY Left 10/09/2022   Procedure: PERIPHERAL VASCULAR BALLOON ANGIOPLASTY;  Surgeon: Leonie Douglas, MD;  Location: [facility] INVASIVE CV LAB;  Service: Cardiovascular;  Laterality: Left;   REVISON OF ARTERIOVENOUS FISTULA Left 09/12/2019   Procedure: REVISON OF LEFT ARM ARTERIOVENOUS FISTULA;  Surgeon: Sherren Kerns, MD;  Location: [facility] OR;  Service: Vascular;  Laterality: Left;   ROBOT ASSISTED LAPAROSCOPIC RADICAL PROSTATECTOMY  2011   TONSILLECTOMY      History reviewed. No pertinent family history.  Social History   Socioeconomic History   Marital status: Married    Spouse name: Not on file   Number of children: Not on file   Years of education: Not on file   Highest education level: Not on file  Occupational History   Not on file  Tobacco Use   Smoking status: Former    Current packs/day: 0.00    Types: Cigarettes    Quit date: 05/18/1982    Years since quitting: 40.9   Smokeless tobacco: Never  Vaping Use   Vaping status: Never Used  Substance and Sexual Activity   Alcohol use: Not Currently    Alcohol/week: 1.0 standard drink of alcohol    Types: 1 Standard drinks or equivalent per week   Drug use: Never   Sexual activity: Not Currently  Other Topics Concern   Not on file  Social History Narrative   Not on file   Social Drivers of Health   Financial Resource Strain: Low Risk  (02/27/2022)   Received from [facility], [facility]   Overall Financial Resource Strain (CARDIA)    Difficulty of Paying Living Expenses: Not hard at all  Food Insecurity: No Food Insecurity (04/17/2023)   Hunger Vital Sign    Worried About Running Out of Food in the Last Year: Never true    Ran Out of Food in the Last Year: Never true  Transportation Needs: No Transportation Needs (04/17/2023)   PRAPARE - Administrator, Civil Service (Medical): No     Lack of Transportation (Non-Medical): No  Physical Activity: Inactive (02/27/2022)   [facility], [facility]   Exercise Vital Sign    Days of Exercise per Week: 0 days    Minutes of Exercise per Session: 0 min  Stress: No Stress Concern Present (02/27/2022)   Received from [facility], [facility] of Occupational Health - Occupational Stress Questionnaire    Feeling of Stress : Not at all  Social Connections: Socially Integrated (04/17/2023)   Social Connection and Isolation Panel [NHANES]    Frequency of Communication with Friends and Family: More than three times a week    Frequency of Social Gatherings with Friends and Family: Three times a week    Attends Religious Services: More than 4 times per year    Active Member of Clubs or Organizations: Yes    Attends Banker Meetings: More than 4 times per year  Marital Status: Married  Catering manager Violence: Not At Risk (04/17/2023)   Humiliation, Afraid, Rape, and Kick questionnaire    Fear of Current or Ex-Partner: No    Emotionally Abused: No    Physically Abused: No    Sexually Abused: No    Allergies  Allergen Reactions   Aspirin Other (See Comments)    GI irritation    Hydrochlorothiazide     Dizziness and unable to walk   Thiazide-Type Diuretics Other (See Comments)    Dizziness and unable to walk   Ibuprofen Other (See Comments)    GI irritation     Current Facility-Administered Medications  Medication Dose Route Frequency Provider Last Rate Last Admin   acetaminophen (TYLENOL) tablet 650 mg  650 mg Oral Q6H PRN Shahmehdi, Seyed A, MD       Or   acetaminophen (TYLENOL) suppository 650 mg  650 mg Rectal Q6H PRN Kendell Bane, MD       [START ON 04/19/2023] apixaban (ELIQUIS) tablet 2.5 mg  2.5 mg Oral BID Shahmehdi, Seyed A, MD       bisacodyl (DULCOLAX) EC tablet 5 mg  5 mg Oral Daily PRN Kendell Bane, MD       [START ON 04/20/2023]  calcitRIOL (ROCALTROL) capsule 0.25 mcg  0.25 mcg Oral Q T,Th,Sa-HD Kendell Bane, MD       [START ON 04/18/2023] carvedilol (COREG) tablet 3.125 mg  3.125 mg Oral 2 times per day on Sunday Monday Wednesday Friday Kendell Bane, MD       And   [START ON 04/19/2023] carvedilol (COREG) tablet 3.125 mg  3.125 mg Oral Once per day on Tuesday Thursday Saturday Kendell Bane, MD       cefUROXime (CEFTIN) tablet 500 mg  500 mg Oral Once per day on Tuesday Thursday Saturday Earnie Larsson, RPH   500 mg at 04/17/23 1836   furosemide (LASIX) tablet 120 mg  120 mg Oral Daily Shahmehdi, Seyed A, MD   120 mg at 04/17/23 1836   hydrALAZINE (APRESOLINE) injection 10 mg  10 mg Intravenous Q4H PRN Shahmehdi, Seyed A, MD       HYDROmorphone (DILAUDID) injection 0.5-1 mg  0.5-1 mg Intravenous Q2H PRN Shahmehdi, Seyed A, MD       ipratropium (ATROVENT) nebulizer solution 0.5 mg  0.5 mg Nebulization Q6H PRN Shahmehdi, Seyed A, MD       levalbuterol (XOPENEX) nebulizer solution 0.63 mg  0.63 mg Nebulization Q6H PRN Shahmehdi, Seyed A, MD       lidocaine-EPINEPHrine (XYLOCAINE W/EPI) 1 %-1:100000 (with pres) injection 20 mL  20 mL Infiltration Once Leonie Douglas, MD       multivitamin (RENA-VIT) tablet 1 tablet  1 tablet Oral QHS Shahmehdi, Seyed A, MD       ondansetron (ZOFRAN) tablet 4 mg  4 mg Oral Q6H PRN Shahmehdi, Seyed A, MD       Or   ondansetron (ZOFRAN) injection 4 mg  4 mg Intravenous Q6H PRN Shahmehdi, Seyed A, MD       oxyCODONE (Oxy IR/ROXICODONE) immediate release tablet 5 mg  5 mg Oral Q4H PRN Shahmehdi, Seyed A, MD       polyvinyl alcohol (LIQUIFILM TEARS) 1.4 % ophthalmic solution 1 drop  1 drop Both Eyes PRN Shahmehdi, Seyed A, MD       rosuvastatin (CRESTOR) tablet 5 mg  5 mg Oral QHS Shahmehdi, Seyed A, MD       senna-docusate (Senokot-S)  tablet 1 tablet  1 tablet Oral QHS PRN Shahmehdi, Seyed A, MD       sertraline (ZOLOFT) tablet 25 mg  25 mg Oral Daily Shahmehdi, Seyed A, MD   25  mg at 04/17/23 1835   [START ON 04/18/2023] sevelamer carbonate (RENVELA) powder PACK 2.4 g  2.4 g Oral TID WC Shahmehdi, Seyed A, MD       sodium chloride flush (NS) 0.9 % injection 3 mL  3 mL Intravenous Q12H Shahmehdi, Seyed A, MD   3 mL at 04/17/23 1432   sodium chloride flush (NS) 0.9 % injection 3-10 mL  3-10 mL Intravenous PRN Shahmehdi, Seyed A, MD       sodium phosphate (FLEET) enema 1 enema  1 enema Rectal Once PRN Shahmehdi, Seyed A, MD       traZODone (DESYREL) tablet 25 mg  25 mg Oral QHS PRN Nevin Bloodgood A, MD        PHYSICAL EXAM Vitals:   04/17/23 1430 04/17/23 1632 04/17/23 1754 04/17/23 2039  BP: (!) 162/62  (!) 156/88 (!) 166/79  Pulse: (!) 55  (!) 53 (!) 51  Resp: (!) 21  (!) 21 (!) 24  Temp:   97.7 F (36.5 C) 98.4 F (36.9 C)  TempSrc:   Oral Oral  SpO2: 97%   98%  Weight:      Height:  6' 0.01" (1.829 m)     Elderly man in no distress Regular rate and rhythm Unlabored breathing Right TDC exit site with significant ecchymosis and fullness consistent with hematoma.  Mild persistent ooze from the exit site grade I did inject about 20 cc of 1% lidocaine with epinephrine to try to stop the oozing.  PERTINENT LABORATORY AND RADIOLOGIC DATA  Most recent CBC    Latest Ref Rng & Units 04/17/2023   12:49 PM 04/17/2023   12:08 PM 02/27/2023   11:44 AM  CBC  WBC 4.0 - 10.5 K/uL  5.4  5.1   Hemoglobin 13.0 - 17.0 g/dL 99.3  71.6  9.5   Hematocrit 39.0 - 52.0 % 41.0  38.0  29.6   Platelets 150 - 400 K/uL  71  72      Most recent CMP    Latest Ref Rng & Units 04/17/2023   12:49 PM 04/17/2023   12:08 PM 02/27/2023   11:44 AM  CMP  Glucose 70 - 99 mg/dL 967  893  810   BUN 8 - 23 mg/dL 40  44  49   Creatinine 0.61 - 1.24 mg/dL 1.75  1.02  5.85   Sodium 135 - 145 mmol/L 138  139  142   Potassium 3.5 - 5.1 mmol/L 4.1  4.1  3.9   Chloride 98 - 111 mmol/L 102  99  103   CO2 22 - 32 mmol/L  25  25   Calcium 8.9 - 10.3 mg/dL  8.8  8.8   Total Protein 6.5 - 8.1  g/dL  6.6    Total Bilirubin 0.0 - 1.2 mg/dL  0.7    Alkaline Phos 38 - 126 U/L  77    AST 15 - 41 U/L  76    ALT 0 - 44 U/L  75      Renal function Estimated Creatinine Clearance: 8.1 mL/min (A) (by C-G formula based on SCr of 7.2 mg/dL (H)).  Hgb A1c MFr Bld (%)  Date Value  07/06/2020 5.4    LDL Cholesterol  Date Value Ref Range Status  07/06/2020 59 0 - 99 mg/dL Final    Comment:           Total Cholesterol/HDL:CHD Risk Coronary Heart Disease Risk Table                     Men   Women  1/2 Average Risk   3.4   3.3  Average Risk       5.0   4.4  2 X Average Risk   9.6   7.1  3 X Average Risk  23.4   11.0        Use the calculated Patient Ratio above and the CHD Risk Table to determine the patient's CHD Risk.        ATP III CLASSIFICATION (LDL):  <100     mg/dL   Optimal  952-841  mg/dL   Near or Above                    Optimal  130-159  mg/dL   Borderline  324-401  mg/dL   High  >027     mg/dL   Very High Performed at [facility], [address], [place], [place] [address]     Rande Brunt. Lenell Antu, MD FACS [facility] Phone Number: [phone] 04/17/2023 9:50 PM   Total time spent on preparing this encounter including chart review, data review, collecting history, examining the patient, coordinating care for this established patient, 80 minutes.  Portions of this report may have been transcribed using voice recognition software.  Every effort has been made to ensure accuracy; however, inadvertent computerized transcription errors may still be present.

## 2023-04-17 NOTE — Progress Notes (Signed)
   04/17/23 1707  TOC Brief Assessment  Insurance and Status Reviewed  Patient has primary care physician Yes  Home environment has been reviewed From Home  Prior level of function: Need Assistance/ HDL  Prior/Current Home Services Current home services  Social Drivers of Health Review SDOH reviewed needs interventions  Readmission risk has been reviewed Yes  Transition of care needs transition of care needs identified, TOC will continue to follow

## 2023-04-17 NOTE — Assessment & Plan Note (Signed)
-   Monitoring H&H  -Hemoglobin, hematocrit at baseline

## 2023-04-17 NOTE — Assessment & Plan Note (Signed)
 In remission    Stable

## 2023-04-17 NOTE — ED Provider Notes (Signed)
  EMERGENCY DEPARTMENT AT [facility] Provider Note   CSN: [id number] Arrival date & time: 04/17/23  1137     History  Chief Complaint  Patient presents with   Vascular Access Problem    Taylor Willis is a 83 y.o. male.  Patient is an 84 year old male who presents to the emergency department with bleeding around his right internal jugular dialysis catheter.  Patient notes that he was evaluated yesterday by interventional radiology secondary to possible complication with his dialysis catheter.  Family notes that attempts were made at that time to remove and replace the catheter though they were unable to do so and patient was rescheduled to come back on Monday.  Family notes that he has had ongoing bleeding from the site since 3 PM yesterday.  He does receive dialysis on Tuesday, Thursday, Saturday but has not had dialysis for approximately 4 days now.  Patient denies any chest pain, shortness of breath, dizziness or lightheadedness.        Home Medications Prior to Admission medications   Medication Sig Start Date End Date Taking? Authorizing Provider  acetaminophen (TYLENOL) 500 MG tablet Take 500 mg by mouth every 6 (six) hours as needed for moderate pain.    [provider]  albuterol (PROVENTIL HFA;VENTOLIN HFA) 108 (90 BASE) MCG/ACT inhaler Inhale 2 puffs into the lungs every 6 (six) hours as needed (Asthma).    [provider]  allopurinol (ZYLOPRIM) 100 MG tablet Take 100 mg by mouth at bedtime. 12/04/19   [provider]  apixaban (ELIQUIS) 2.5 MG TABS tablet Take 1 tablet (2.5 mg total) by mouth See admin instructions. Take 2.5 mg Tues.Ronda Fairly Sat, in the evening Take 2.5 mg on Monday, Wednesday, Friday and Sunday twice a day 02/27/23   Marcelino Duster, MD  B Complex-C-Folic Acid (TRIPHROCAPS) 1 MG CAPS Take 1 capsule by mouth daily.    [provider]  calcitRIOL (ROCALTROL) 0.25 MCG capsule Take 0.25 mcg by  mouth Every Tuesday,Thursday,and Saturday with dialysis.    [provider]  carboxymethylcellulose 1 % ophthalmic solution Place 1 drop into both eyes daily as needed (Dry eyes).    [provider]  carvedilol (COREG) 3.125 MG tablet Take 1 tablet (3.125 mg total) by mouth See admin instructions. Take 1 tablet in the evenings on Dialysis days (Tues, Thurs, and Sat), On Non-dialysis days take 1 tablet twice a day. Patient taking differently: Take 3.125 mg by mouth every evening. 04/29/20   Shon Hale, MD  cefdinir (OMNICEF) 300 MG capsule Take 300 mg by mouth 2 (two) times daily. 04/13/23   [provider]  furosemide (LASIX) 40 MG tablet Take 120 mg by mouth daily. 07/17/21   [provider]  multivitamin (RENA-VIT) TABS tablet Take 1 tablet by mouth at bedtime. 07/25/21   [provider]  rosuvastatin (CRESTOR) 5 MG tablet Take 5 mg by mouth at bedtime.    [provider]  sertraline (ZOLOFT) 25 MG tablet Take 25 mg by mouth daily. 07/29/21   [provider]  sevelamer carbonate (RENVELA) 2.4 g PACK Take 2.4 g by mouth 3 (three) times daily with meals.    [provider]  traZODone (DESYREL) 50 MG tablet Take 25 mg by mouth at bedtime.    [provider]  vitamin B-12 (CYANOCOBALAMIN) 1000 MCG tablet Take 1,000 mcg by mouth daily.    [provider]      Allergies    Aspirin, Hydrochlorothiazide, Thiazide-type  diuretics, and Ibuprofen    Review of Systems   Review of Systems  Skin:        Bleeding around dialysis catheter  All other systems reviewed and are negative.   Physical Exam Updated Vital Signs BP (!) 158/64   Pulse (!) 56   Resp 20   Ht 6' (1.829 m)   Wt 72 kg   SpO2 96%   BMI 21.53 kg/m  Physical Exam Vitals and nursing note reviewed.  Constitutional:      Appearance: Normal appearance.  HENT:     Head: Normocephalic and atraumatic.     Nose: Nose normal.     Mouth/Throat:      Mouth: Mucous membranes are moist.  Eyes:     Extraocular Movements: Extraocular movements intact.     Conjunctiva/sclera: Conjunctivae normal.     Pupils: Pupils are equal, round, and reactive to light.  Cardiovascular:     Rate and Rhythm: Normal rate and regular rhythm.     Pulses: Normal pulses.     Heart sounds: Normal heart sounds. No murmur heard.    No gallop.  Pulmonary:     Effort: Pulmonary effort is normal. No respiratory distress.     Breath sounds: Normal breath sounds. No stridor. No wheezing or rales.  Abdominal:     General: Abdomen is flat. Bowel sounds are normal. There is no distension.     Palpations: Abdomen is soft.     Tenderness: There is no abdominal tenderness. There is no guarding.  Musculoskeletal:        General: Normal range of motion.     Cervical back: Normal range of motion and neck supple.  Skin:    General: Skin is warm and dry.  Neurological:     General: No focal deficit present.     Mental Status: He is alert and oriented to person, place, and time. Mental status is at baseline.  Psychiatric:        Mood and Affect: Mood normal.        Behavior: Behavior normal.        Thought Content: Thought content normal.        Judgment: Judgment normal.     ED Results / Procedures / Treatments   Labs (all labs ordered are listed, but only abnormal results are displayed) Labs Reviewed  COMPREHENSIVE METABOLIC PANEL  CBC WITH DIFFERENTIAL/PLATELET  APTT  PROTIME-INR  I-STAT CHEM 8, ED  TYPE AND SCREEN    EKG None  Radiology IR Fluoro Guide CV Line Right Result Date: 04/16/2023 INDICATION: Malfunctioning tunneled dialysis catheter with broken arterial lumen. The patient presents for dialysis catheter exchange. EXAM: ATTEMPTED REMOVAL OF TUNNELED CENTRAL VENOUS CATHETER MEDICATIONS: None ANESTHESIA/SEDATION: None FLUOROSCOPY: Radiation Exposure Index (as provided by the fluoroscopic device): 36 seconds. 4.0 mGy. COMPLICATIONS: None  immediate. PROCEDURE: Informed written consent was obtained from the patient after a thorough discussion of the procedural risks, benefits and alternatives. All questions were addressed. Maximal Sterile Barrier Technique was utilized including caps, mask, sterile gowns, sterile gloves, sterile drape, hand hygiene and skin antiseptic. A timeout was performed prior to the initiation of the procedure. The patient's right chest and catheter was prepped and draped in a normal sterile fashion. Heparin was removed from both ports of catheter. 1% lidocaine was used for local anesthesia. Attempt was made to remove the tunneled right-sided catheter placed via the right internal jugular vein by applying traction to the catheter, extending the catheter exit site by  an incision, retracting the tissue and utilizing both sharp and blunt dissection. Ultimately, a Prolene retention suture was applied at the exit site and a dressing placed over the catheter exit site. FINDINGS: Initial fluoroscopy demonstrates a tunneled split-type hemodialysis catheter via the right internal jugular vein with the distal catheter tip in the right atrium. The catheter could not be removed for exchange utilizing extensive blunt and sharp dissection as the subcutaneous cuff was located too deep to be located and may be at/near the level of right jugular venous access. Attempted catheter removal was also quite painful for the patient utilizing only local anesthetic and the patient was not set up today for moderate IV conscious sedation or recovery. Plans were made for the patient to be rescheduled on 04/19/2023 for further attempted catheter removal which may require a separate cutdown incision and placement of a new dialysis catheter either via catheter exchange or new puncture of either the right or left internal jugular vein. IMPRESSION: 1. Inability to remove the pre-existing malfunctioning tunneled right jugular hemodialysis catheter to allow  catheter exchange today over a guidewire. The subcutaneous cuff was located in too deep of a position to be located and may be at the level of right jugular venous access. 2. Plans were made for the patient to be rescheduled on 04/19/2023 for further attempted catheter removal under moderate IV conscious sedation which may require a separate cutdown incision and placement of a new dialysis catheter either via catheter exchange or new puncture of either the right or left internal jugular vein. Electronically Signed   By: Irish Lack M.D.   On: 04/16/2023 16:00    Procedures Procedures    Medications Ordered in ED Medications - No data to display  ED Course/ Medical Decision Making/ A&P                                 Medical Decision Making Amount and/or Complexity of Data Reviewed Labs: ordered. Radiology: ordered. ECG/medicine tests: ordered.  Risk Decision regarding hospitalization.   This patient presents to the ED for concern of bleeding around dialysis catheter, this involves an extensive number of treatment options, and is a complaint that carries with it a high risk of complications and morbidity.  The differential diagnosis includes anemia, acute hemorrhage, pneumothorax, hemothorax, electrolyte derangement   Co morbidities that complicate the patient evaluation  Chronic kidney disease, dialysis dependent   Additional history obtained:  Additional history obtained from family External records from outside source obtained and reviewed including records   Lab Tests:  I Ordered, and personally interpreted labs.  The pertinent results include: Stable anemia, elevated creatinine, normal electrolytes   Imaging Studies ordered:  I ordered imaging studies including chest x-ray I independently visualized and interpreted imaging which showed right lower lobe consolidation I agree with the radiologist interpretation   Cardiac Monitoring: / EKG:  The patient was  maintained on a cardiac monitor.  I personally viewed and interpreted the cardiac monitored which showed an underlying rhythm of: Atrial fibrillation, rate of 48, no ST changes, no STEMI   Consultations Obtained:  I requested consultation with the nephrology, Dr. Kathrene Bongo, vascular surgery Dr. Lenell Antu,  and discussed lab and imaging findings as well as pertinent plan - they recommend: Admission at [facility], vascular surgery consult, no emergent dialysis at this time   Problem List / ED Course / Critical interventions / Medication management  Patient is doing  well at this time.  Discussed patient case with Dr. Kathrene Bongo with nephrology who notes that the patient does not warrant emergent dialysis at this time.  She is in agreement for admission to [facility] and consult with vascular surgery.  Did discuss the patient case with Dr. Lenell Antu who did state that he would be happy to see the patient in consult when he arrives.  He did ask that I discussed with the patient if they would rather go to [place] as he has seen vascular surgery there before.  Did discuss this with the family but they are in agreement that they would rather go to [facility] at this time.  Patient does have a chest x-ray with right lower lobe consolidation but do not feel that this is secondary to pneumonia at this point.  Patient has no worsening anemia at this time.  Patient does have stable vital signs as well with no associated hypotension or tachycardia.  Discussed patient case with Dr. Flossie Dibble with the hospitalist service who has excepted at this time.  Patient has no active diffuse bleeding on exam.   Social Determinants of Health:  Dialysis dependent   Test / Admission - Considered:  Admission        Final Clinical Impression(s) / ED Diagnoses Final diagnoses:  None    Rx / DC Orders ED Discharge Orders     None         Kathlen Mody 04/17/23 1326    Bethann Berkshire,  MD 04/18/23 [phone]

## 2023-04-17 NOTE — Assessment & Plan Note (Signed)
-   Malfunctioning pre-existing tunneled right jugular hemodialysis catheter. Failed removal and replacement by interventional radiologist under fluoroscopy on 04/16/2023 -Ongoing bleeding and hematoma at the site -Patient will be admitted to [facility] for vascular evaluation EDP has consulted Dr. Lenell Antu (EDP discussed option of going to [place] but patient and family has requested to go to [facility]) -Will continue applying pressure -Withholding his home medication of Eliquis for now.

## 2023-04-17 NOTE — Assessment & Plan Note (Signed)
-  Per patient/wife has recent history of UTI on antibiotics -Denies have any dysuria, febrile, no leukocytosis -Continue current home p.o. antibiotics

## 2023-04-17 NOTE — Assessment & Plan Note (Signed)
-   Mildly hypoxic, satting 90% on room air -Will continue DuoNeb bronchodilator as needed -Supplemental oxygen to maintain O2 sat greater than 92%

## 2023-04-17 NOTE — Assessment & Plan Note (Signed)
-   Has been on Eliquis 2.5 mg -hold secondary to dialysis catheter bleed/hematoma -Continue statins, beta-blockers  (Holding home dose Coreg; mildly bradycardic at this time) -Rate controlled, at times mildly bradycardic

## 2023-04-17 NOTE — ED Notes (Signed)
 Last dialysis 04/13/23.

## 2023-04-17 NOTE — Assessment & Plan Note (Signed)
 Continue PPI ?

## 2023-04-17 NOTE — Assessment & Plan Note (Signed)
-   On hemodialysis through right jugular permacatheter now malfunctioning -Recent history of graft placement, has not matured yet -EDP consulted nephrologist Dr. [place] Sink patient does not warrant emergent dialysis at this time She will follow-up with [facility]

## 2023-04-17 NOTE — Assessment & Plan Note (Signed)
-   Currently stable, continuing home medication of Coreg, Lasix As needed hydralazine

## 2023-04-17 NOTE — Assessment & Plan Note (Signed)
-   Continue home medication including trazodone

## 2023-04-17 NOTE — H&P (Signed)
 History and Physical   Patient: Taylor Willis                            PCP: Donetta Potts, MD                    DOB: 1940/10/28            DOA: 04/17/2023 ZHY:[id number]             DOS: 04/17/2023, 2:14 PM  Donetta Potts, MD  Patient coming from:   HOME  I have personally reviewed patient's medical records, in electronic medical records, including:  [facility], and care everywhere.    Chief Complaint:   Chief Complaint  Patient presents with   Vascular Access Problem    History of present illness:    Taylor Willis is a 83 year old male with extensive history of ESRD on HD TTS with temporary permacatheter, graft creation-not mature yet, GERD, HTN, gout, stroke,   Presented today to ED with bleeding around the right internal jugular dialysis catheter. Patient was evaluated yesterday by interventional radiologist for complication with his dialysis catheter-was not able to remove the pre-existing malfunctioning tunneled dialysis catheter to allow for exchange-procedure was attempted under fluoroscopy. Subsequently patient was rescheduled for 04/19/23.  To remove and replace the cath  Per patient and family he continued to have ongoing bleeding at the site since 3 PM yesterday./  He receives hemodialysis on Tuesday-no dialysis since then.  ED evaluation/course: Blood pressure (!) 163/71, pulse 63, resp. rate 20, height 6' (1.829 m), weight 72 kg, SpO2 90%.  Labs: Hemoglobin 13.9, BUN 40, creatinine 7.2, glucose 124, 114,   EDP: Discussed the case with nephrologist Dr. Kathrene Bongo -who reported patient does not need any immediate dialysis at this time. Requested patient to be admitted to [facility] and consult vascular surgery.  Vascular surgery-Dr. Criss Alvine was consulted by EDP Who is willing to consult on patient upon arrival. (EDP discussed with patient possibly go to [place]-but they opt to go to [facility] at this time.  Patient is hemodynamically stable  requested to be admitted at [facility]  Patient Denies having: Fever, Chills, Cough, SOB, Chest Pain, Abd pain, N/V/D, headache, dizziness, lightheadedness,  Dysuria, Joint pain, rash, open wounds    Review of Systems: As per HPI, otherwise 10 point review of systems were negative.   ----------------------------------------------------------------------------------------------------------------------  Allergies  Allergen Reactions   Aspirin Other (See Comments)    GI irritation    Hydrochlorothiazide     Dizziness and unable to walk   Thiazide-Type Diuretics Other (See Comments)    Dizziness and unable to walk   Ibuprofen Other (See Comments)    GI irritation     Home MEDs:  Prior to Admission medications   Medication Sig Start Date End Date Taking? Authorizing Provider  acetaminophen (TYLENOL) 500 MG tablet Take 500 mg by mouth every 6 (six) hours as needed for moderate pain.    [provider]  albuterol (PROVENTIL HFA;VENTOLIN HFA) 108 (90 BASE) MCG/ACT inhaler Inhale 2 puffs into the lungs every 6 (six) hours as needed (Asthma).    [provider]  allopurinol (ZYLOPRIM) 100 MG tablet Take 100 mg by mouth at bedtime. 12/04/19   [provider]  apixaban (ELIQUIS) 2.5 MG TABS tablet Take 1 tablet (2.5 mg total) by mouth See admin instructions. Take 2.5 mg Tues., Thurs.,and Sat, in the evening Take 2.5 mg  on Monday, Wednesday, Friday and Sunday twice a day 02/27/23   Taylor Duster, MD  B Complex-C-Folic Acid (TRIPHROCAPS) 1 MG CAPS Take 1 capsule by mouth daily.    [provider]  calcitRIOL (ROCALTROL) 0.25 MCG capsule Take 0.25 mcg by mouth Every Tuesday,Thursday,and Saturday with dialysis.    [provider]  carboxymethylcellulose 1 % ophthalmic solution Place 1 drop into both eyes daily as needed (Dry eyes).    [provider]  carvedilol (COREG) 3.125 MG tablet Take 1 tablet (3.125 mg total) by mouth See admin  instructions. Take 1 tablet in the evenings on Dialysis days (Tues, Thurs, and Sat), On Non-dialysis days take 1 tablet twice a day. Patient taking differently: Take 3.125 mg by mouth every evening. 04/29/20   Shon Hale, MD  cefdinir (OMNICEF) 300 MG capsule Take 300 mg by mouth 2 (two) times daily. 04/13/23   [provider]  furosemide (LASIX) 40 MG tablet Take 120 mg by mouth daily. 07/17/21   [provider]  multivitamin (RENA-VIT) TABS tablet Take 1 tablet by mouth at bedtime. 07/25/21   [provider]  rosuvastatin (CRESTOR) 5 MG tablet Take 5 mg by mouth at bedtime.    [provider]  sertraline (ZOLOFT) 25 MG tablet Take 25 mg by mouth daily. 07/29/21   [provider]  sevelamer carbonate (RENVELA) 2.4 g PACK Take 2.4 g by mouth 3 (three) times daily with meals.    [provider]  traZODone (DESYREL) 50 MG tablet Take 25 mg by mouth at bedtime.    [provider]  vitamin B-12 (CYANOCOBALAMIN) 1000 MCG tablet Take 1,000 mcg by mouth daily.    [provider]    PRN MEDs: sodium chloride, acetaminophen **OR** acetaminophen, bisacodyl, hydrALAZINE, HYDROmorphone (DILAUDID) injection, ipratropium, levalbuterol, ondansetron **OR** ondansetron (ZOFRAN) IV, oxyCODONE, polyvinyl alcohol, senna-docusate, sodium chloride flush, sodium phosphate, traZODone  Past Medical History:  Diagnosis Date   Arthritis    gout   Asthma    has rescue inhaler   Cancer (HCC)    prostate   Chronic kidney disease    GERD (gastroesophageal reflux disease)    H/O peptic ulcer 1970's   Herpes 04/28/2020   Hyperlipidemia    Hypertension    Ureteral stone    Wears dentures    Wears glasses     Past Surgical History:  Procedure Laterality Date   A/V FISTULAGRAM Left 10/09/2022   Procedure: A/V Fistulagram;  Surgeon: Leonie Douglas, MD;  [facility]: [facility] INVASIVE [facility];  Service: Cardiovascular;  Laterality: Left;   A/V FISTULAGRAM  N/A 02/26/2023   Procedure: A/V Fistulagram;  Surgeon: Renford Dills, MD;  Location: [facility] INVASIVE CV LAB;  Service: Cardiovascular;  Laterality: N/A;   AV FISTULA PLACEMENT Left 02/15/2019   Procedure: ARTERIOVENOUS (AV) FISTULA CREATION LEFT ARM;  Surgeon: Larina Earthly, MD;  Location: [facility] OR;  Service: Vascular;  Laterality: Left;   CATARACT EXTRACTION W/ INTRAOCULAR LENS  IMPLANT, BILATERAL     CYSTOSCOPY/RETROGRADE/URETEROSCOPY/STONE EXTRACTION WITH BASKET Right 05/23/2012   Procedure: CYSTOSCOPY/RETROGRADE/URETEROSCOPY/STONE EXTRACTION WITH BASKET;  Surgeon: Valetta Fuller, MD;  Location: [facility];  Service: Urology;  Laterality: Right;   DIALYSIS/PERMA CATHETER INSERTION N/A 02/26/2023   Procedure: DIALYSIS/PERMA CATHETER INSERTION;  Surgeon: Renford Dills, MD;  Location: [facility] INVASIVE CV LAB;  Service: Cardiovascular;  Laterality: N/A;   FRACTURE SURGERY Right    as a child   HERNIA REPAIR     HOLMIUM LASER APPLICATION  Right 05/23/2012   Procedure: HOLMIUM LASER APPLICATION;  Surgeon: Valetta Fuller, MD;  Location: [facility];  Service: Urology;  Laterality: Right;   INSERTION OF DIALYSIS CATHETER Right 04/08/2020   Procedure: INSERTION OF TUNNELED DIALYSIS CATHETER;  Surgeon: Lucretia Roers, MD;  Location: [facility];  Service: General;  Laterality: Right;   IR FLUORO GUIDE CV LINE RIGHT  04/16/2023   LIGATION OF ARTERIOVENOUS  FISTULA Left 09/12/2019   Procedure: SIDE BRANCH LIGATION  OF LEFT ARM ARTERIOVENOUS FISTULA;  Surgeon: Sherren Kerns, MD;  Location: [facility] OR;  Service: Vascular;  Laterality: Left;   MULTIPLE TOOTH EXTRACTIONS     PERIPHERAL VASCULAR BALLOON ANGIOPLASTY  05/22/2020   Procedure: PERIPHERAL VASCULAR BALLOON ANGIOPLASTY;  Surgeon: Leonie Douglas, MD;  Location: [facility];  Service: Cardiovascular;;  Left arm fistula   PERIPHERAL VASCULAR BALLOON ANGIOPLASTY Left 10/09/2022   Procedure: PERIPHERAL VASCULAR BALLOON  ANGIOPLASTY;  Surgeon: Leonie Douglas, MD;  Location: [facility];  Service: Cardiovascular;  Laterality: Left;   REVISON OF ARTERIOVENOUS FISTULA Left 09/12/2019   Procedure: REVISON OF LEFT ARM ARTERIOVENOUS FISTULA;  Surgeon: Sherren Kerns, MD;  Location: [facility] OR;  Service: Vascular;  Laterality: Left;   ROBOT ASSISTED LAPAROSCOPIC RADICAL PROSTATECTOMY  2011   TONSILLECTOMY       reports that he quit smoking about 40 years ago. His smoking use included cigarettes. He has never used smokeless tobacco. He reports that he does not currently use alcohol after a past usage of about 1.0 standard drink of alcohol per week. He reports that he does not use drugs.   History reviewed. No pertinent family history.  Physical Exam:   Vitals:   04/17/23 1215 04/17/23 1221 04/17/23 1330 04/17/23 1345  BP: (!) 158/64  (!) 163/71 (!) 159/65  Pulse: (!) 54 (!) 56 63 (!) 48  Resp: (!) 28 20 20  (!) 21  SpO2: 97% 96% 90% 98%  Weight:      Height:       Constitutional: NAD, calm, comfortable Eyes: PERRL, lids and conjunctivae normal ENMT: Mucous membranes are moist. Posterior pharynx clear of any exudate or lesions.Normal dentition.  Neck: normal, supple, no masses, no thyromegaly Respiratory: clear to auscultation bilaterally, no wheezing, no crackles. Normal respiratory effort. No accessory muscle use.  Cardiovascular: Regular rate and rhythm, no murmurs / rubs / gallops. No extremity edema. 2+ pedal pulses. No carotid bruits.  Abdomen: no tenderness, no masses palpated. No hepatosplenomegaly. Bowel sounds positive.  Musculoskeletal:  Chronic mild right-sided weakness due to previous stroke no clubbing / cyanosis. No joint deformity upper and lower extremities. Good ROM, no contractures. Normal muscle tone.  Neurologic: Right-sided weakness  CN II-XII grossly intact. Sensation intact, DTR normal. Strength 5/5 in all 4.  Psychiatric: Normal judgment and insight. Alert and oriented x 3.  Normal mood.  Skin: no rashes, lesions, ulcers. No induration  Wounds: Right chest wall hematoma, mild jaw/neck hematoma-bleeding with clot at catheter site  Pressure Injury 04/29/20 Buttocks Stage 2 -  Partial thickness loss of dermis presenting as a shallow open injury with a red, pink wound bed without slough. stage 2 to buttocks .5 cm x .5 cm (Active)  04/29/20 0653  Location: Buttocks  Location Orientation:   Staging: Stage 2 -  Partial thickness loss of dermis presenting as a shallow open injury with a red, pink wound bed without slough.  Wound Description (Comments): stage 2 to buttocks .5 cm x .5  cm  Present on Admission:      Labs on admission:    I have personally reviewed following labs and imaging studies  CBC: Recent Labs  Lab 04/17/23 1208 04/17/23 1249  WBC 5.4  --   NEUTROABS 4.0  --   HGB 12.0* 13.9  HCT 38.0* 41.0  MCV 101.1*  --   PLT 71*  --    Basic Metabolic Panel: Recent Labs  Lab 04/17/23 1208 04/17/23 1249 04/17/23 1324  NA 139 138  --   K 4.1 4.1  --   CL 99 102  --   CO2 25  --   --   GLUCOSE 124* 114*  --   BUN 44* 40*  --   CREATININE 6.72* 7.20*  --   CALCIUM 8.8*  --   --   MG  --   --  2.3  PHOS  --   --  3.7   GFR: Estimated Creatinine Clearance: 8.1 mL/min (A) (by C-G formula based on SCr of 7.2 mg/dL (H)). Liver Function Tests: Recent Labs  Lab 04/17/23 1208  AST 76*  ALT 75*  ALKPHOS 77  BILITOT 0.7  PROT 6.6  ALBUMIN 3.4*   No results for input(s): "LIPASE", "AMYLASE" in the last 168 hours. No results for input(s): "AMMONIA" in the last 168 hours. Coagulation Profile: Recent Labs  Lab 04/17/23 1208  INR 1.4*    Urine analysis:    Component Value Date/Time   COLORURINE YELLOW 04/10/2020 1116   APPEARANCEUR HAZY (A) 04/10/2020 1116   LABSPEC 1.011 04/10/2020 1116   PHURINE 5.0 04/10/2020 1116   GLUCOSEU NEGATIVE 04/10/2020 1116   HGBUR MODERATE (A) 04/10/2020 1116   BILIRUBINUR NEGATIVE 04/10/2020 1116    KETONESUR NEGATIVE 04/10/2020 1116   PROTEINUR 100 (A) 04/10/2020 1116   NITRITE NEGATIVE 04/10/2020 1116   LEUKOCYTESUR NEGATIVE 04/10/2020 1116    Last A1C:  Lab Results  Component Value Date   HGBA1C 5.4 07/06/2020     Radiologic Exams on Admission:   DG Chest Port 1 View Result Date: 04/17/2023 CLINICAL DATA:  bleeding around dialysis port EXAM: PORTABLE CHEST 1 VIEW COMPARISON:  None Available. FINDINGS: There is new homogeneous opacity overlying the right lateral costophrenic angle region lung with subtle blunting, favoring pleural effusion with or without associated atelectasis versus consolidation. Correlate clinically. Bilateral lung Willis are otherwise clear. Left lateral costophrenic angle is clear. No pneumothorax. Stable cardio-mediastinal silhouette. No acute osseous abnormalities. There are subacute/healing right posterolateral eighth and ninth rib fractures noted. The soft tissues are within normal limits. Vascular stent noted overlying the left axillary region. Right IJ hemodialysis catheter noted with its tip overlying the cavoatrial junction region. IMPRESSION: New homogeneous opacity overlying the right lateral costophrenic angle region lung with subtle blunting, favoring pleural effusion with or without associated atelectasis versus consolidation. Correlate clinically. Electronically Signed   By: Jules Schick M.D.   On: 04/17/2023 13:09   IR Fluoro Guide CV Line Right Result Date: 04/16/2023 INDICATION: Malfunctioning tunneled dialysis catheter with broken arterial lumen. The patient presents for dialysis catheter exchange. EXAM: ATTEMPTED REMOVAL OF TUNNELED CENTRAL VENOUS CATHETER MEDICATIONS: None ANESTHESIA/SEDATION: None FLUOROSCOPY: Radiation Exposure Index (as provided by the fluoroscopic device): 36 seconds. 4.0 mGy. COMPLICATIONS: None immediate. PROCEDURE: Informed written consent was obtained from the patient after a thorough discussion of the procedural  risks, benefits and alternatives. All questions were addressed. Maximal Sterile Barrier Technique was utilized including caps, mask, sterile gowns, sterile gloves, sterile drape, hand hygiene  and skin antiseptic. A timeout was performed prior to the initiation of the procedure. The patient's right chest and catheter was prepped and draped in a normal sterile fashion. Heparin was removed from both ports of catheter. 1% lidocaine was used for local anesthesia. Attempt was made to remove the tunneled right-sided catheter placed via the right internal jugular vein by applying traction to the catheter, extending the catheter exit site by an incision, retracting the tissue and utilizing both sharp and blunt dissection. Ultimately, a Prolene retention suture was applied at the exit site and a dressing placed over the catheter exit site. FINDINGS: Initial fluoroscopy demonstrates a tunneled split-type hemodialysis catheter via the right internal jugular vein with the distal catheter tip in the right atrium. The catheter could not be removed for exchange utilizing extensive blunt and sharp dissection as the subcutaneous cuff was located too deep to be located and may be at/near the level of right jugular venous access. Attempted catheter removal was also quite painful for the patient utilizing only local anesthetic and the patient was not set up today for moderate IV conscious sedation or recovery. Plans were made for the patient to be rescheduled on 04/19/2023 for further attempted catheter removal which may require a separate cutdown incision and placement of a new dialysis catheter either via catheter exchange or new puncture of either the right or left internal jugular vein. IMPRESSION: 1. Inability to remove the pre-existing malfunctioning tunneled right jugular hemodialysis catheter to allow catheter exchange today over a guidewire. The subcutaneous cuff was located in too deep of a position to be located and may be at  the level of right jugular venous access. 2. Plans were made for the patient to be rescheduled on 04/19/2023 for further attempted catheter removal under moderate IV conscious sedation which may require a separate cutdown incision and placement of a new dialysis catheter either via catheter exchange or new puncture of either the right or left internal jugular vein. Electronically Signed   By: Irish Lack M.D.   On: 04/16/2023 16:00    EKG:   Independently reviewed.  Orders placed or performed during the hospital encounter of 04/17/23   EKG 12-Lead   EKG 12-Lead   EKG 12-Lead   EKG 12-Lead   EKG 12-Lead   ---------------------------------------------------------------------------------------------------------------------------------------    Assessment / Plan:   Principal Problem:   Problem with dialysis access, initial encounter [facility]) Active Problems:   ESRD (end stage renal disease) (HCC)   Benign essential hypertension   Embolic stroke (HCC)   History of UTI   GERD (gastroesophageal reflux disease)   Anemia in chronic kidney disease   Mild intermittent asthma, uncomplicated   Personal history of malignant neoplasm of prostate   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 Failed removal and replacement by interventional radiologist under fluoroscopy on 04/16/2023 -Ongoing bleeding and hematoma at the site -Patient will be admitted to [facility] for vascular evaluation EDP has consulted Dr. Lenell Antu (EDP discussed option of going to [place] but patient and family has requested to go to [facility]) -Will continue applying pressure -Withholding his home medication of Eliquis for now.  ESRD (end stage renal disease) (HCC) - On hemodialysis through right jugular permacatheter now malfunctioning -Recent history of  graft placement, has not matured yet -EDP consulted nephrologist Dr. Hornbrook Sink patient does not warrant emergent dialysis at this time  She will follow-up with [facility]  History of UTI -Per patient/wife has recent history of UTI on antibiotics -Denies have any dysuria, febrile, no leukocytosis -Continue current home p.o. antibiotics  Embolic stroke (HCC) - History of embolic stroke, patient is on Eliquis -Will hold Eliquis for now due to hematoma and bleeding from right jugular tunneled hemodialysis cath  Benign essential hypertension - Currently stable, continuing home medication of Coreg, Lasix As needed hydralazine  Personal history of malignant neoplasm of prostate - In remission -Stable  Mild intermittent asthma, uncomplicated - Mildly hypoxic, satting 90% on room air -Will continue DuoNeb bronchodilator as needed -Supplemental oxygen to maintain O2 sat greater than 92%   Anemia in chronic kidney disease - Monitoring H&H  -Hemoglobin, hematocrit at baseline  GERD (gastroesophageal reflux disease) - Continue PPI  History of atrial fibrillation - Has been on Eliquis 2.5 mg -hold secondary to dialysis catheter bleed/hematoma -Continue statins, beta-blockers  (Holding home dose Coreg; mildly bradycardic at this time) -Rate controlled, at times mildly bradycardic   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 Kathrene Bongo, vascular Dr. Lenell Antu  -------------------------------------------------------------------------------------------------------------------------------------------- DVT prophylaxis:  apixaban (ELIQUIS) tablet 2.5 mg Start: 04/19/23 1000 TED hose Start: 04/17/23 1324 SCDs Start: 04/17/23 1324 apixaban (ELIQUIS) tablet 2.5 mg   Code Status:   Code Status: Full Code   Admission status: Patient will be admitted as Observation, with a greater than 2  midnight length of stay. Level of care: Med-Surg   Family Communication:  none at bedside  (The above findings and plan of care has been discussed with patient in detail, the patient expressed understanding and agreement of above plan)  --------------------------------------------------------------------------------------------------------------------------------------------------  Disposition Plan:  Anticipated 1-2 days Status is: Observation The patient remains OBS appropriate and will d/c before 2 midnights.     ----------------------------------------------------------------------------------------------------------------------------------------------------  Time spent:  36  Min.  Was spent seeing and evaluating the patient, reviewing all medical records, drawn plan of care.  SIGNED: Kendell Bane, MD, FHM. FAAFP. [place] [facility], Pager  (Please use [email or web address] to page/ or secure chat through epic) If 7PM-7AM, please contact night-coverage [email or web address],  04/17/2023, 2:14 PM

## 2023-04-17 NOTE — Discharge Instructions (Signed)

## 2023-04-18 ENCOUNTER — Other Ambulatory Visit: Payer: Self-pay

## 2023-04-18 ENCOUNTER — Observation Stay (HOSPITAL_COMMUNITY): Admitting: Anesthesiology

## 2023-04-18 ENCOUNTER — Encounter (HOSPITAL_COMMUNITY): Payer: Self-pay | Admitting: Family Medicine

## 2023-04-18 ENCOUNTER — Observation Stay (HOSPITAL_COMMUNITY)

## 2023-04-18 ENCOUNTER — Encounter (HOSPITAL_COMMUNITY)
Admission: EM | Disposition: A | Discharge status: Home / Self Care | Payer: Self-pay | Source: Home / Self Care | Attending: Internal Medicine

## 2023-04-18 ENCOUNTER — Other Ambulatory Visit: Payer: Self-pay | Admitting: Interventional Radiology

## 2023-04-18 DIAGNOSIS — Z8546 Personal history of malignant neoplasm of prostate: Secondary | ICD-10-CM | POA: Diagnosis not present

## 2023-04-18 DIAGNOSIS — I10 Essential (primary) hypertension: Secondary | ICD-10-CM

## 2023-04-18 DIAGNOSIS — J45909 Unspecified asthma, uncomplicated: Secondary | ICD-10-CM | POA: Diagnosis not present

## 2023-04-18 DIAGNOSIS — I4891 Unspecified atrial fibrillation: Secondary | ICD-10-CM | POA: Diagnosis present

## 2023-04-18 DIAGNOSIS — N186 End stage renal disease: Secondary | ICD-10-CM | POA: Diagnosis present

## 2023-04-18 DIAGNOSIS — G479 Sleep disorder, unspecified: Secondary | ICD-10-CM | POA: Diagnosis present

## 2023-04-18 DIAGNOSIS — I639 Cerebral infarction, unspecified: Secondary | ICD-10-CM

## 2023-04-18 DIAGNOSIS — S0083XA Contusion of other part of head, initial encounter: Secondary | ICD-10-CM | POA: Diagnosis present

## 2023-04-18 DIAGNOSIS — Z79899 Other long term (current) drug therapy: Secondary | ICD-10-CM | POA: Diagnosis not present

## 2023-04-18 DIAGNOSIS — T82898A Other specified complication of vascular prosthetic devices, implants and grafts, initial encounter: Secondary | ICD-10-CM | POA: Diagnosis not present

## 2023-04-18 DIAGNOSIS — Z7901 Long term (current) use of anticoagulants: Secondary | ICD-10-CM | POA: Diagnosis not present

## 2023-04-18 DIAGNOSIS — N185 Chronic kidney disease, stage 5: Secondary | ICD-10-CM | POA: Diagnosis not present

## 2023-04-18 DIAGNOSIS — T82838A Hemorrhage of vascular prosthetic devices, implants and grafts, initial encounter: Principal | ICD-10-CM

## 2023-04-18 DIAGNOSIS — J4520 Mild intermittent asthma, uncomplicated: Secondary | ICD-10-CM | POA: Diagnosis present

## 2023-04-18 DIAGNOSIS — K219 Gastro-esophageal reflux disease without esophagitis: Secondary | ICD-10-CM | POA: Diagnosis present

## 2023-04-18 DIAGNOSIS — D62 Acute posthemorrhagic anemia: Secondary | ICD-10-CM | POA: Diagnosis present

## 2023-04-18 DIAGNOSIS — D631 Anemia in chronic kidney disease: Secondary | ICD-10-CM | POA: Diagnosis present

## 2023-04-18 DIAGNOSIS — C61 Malignant neoplasm of prostate: Secondary | ICD-10-CM | POA: Diagnosis present

## 2023-04-18 DIAGNOSIS — T829XXA Unspecified complication of cardiac and vascular prosthetic device, implant and graft, initial encounter: Principal | ICD-10-CM | POA: Diagnosis present

## 2023-04-18 DIAGNOSIS — Z01818 Encounter for other preprocedural examination: Secondary | ICD-10-CM

## 2023-04-18 DIAGNOSIS — Z87440 Personal history of urinary (tract) infections: Secondary | ICD-10-CM | POA: Diagnosis not present

## 2023-04-18 DIAGNOSIS — I120 Hypertensive chronic kidney disease with stage 5 chronic kidney disease or end stage renal disease: Secondary | ICD-10-CM | POA: Diagnosis present

## 2023-04-18 DIAGNOSIS — Z992 Dependence on renal dialysis: Secondary | ICD-10-CM | POA: Diagnosis not present

## 2023-04-18 DIAGNOSIS — D696 Thrombocytopenia, unspecified: Secondary | ICD-10-CM | POA: Diagnosis present

## 2023-04-18 DIAGNOSIS — F05 Delirium due to known physiological condition: Secondary | ICD-10-CM | POA: Diagnosis present

## 2023-04-18 DIAGNOSIS — N2581 Secondary hyperparathyroidism of renal origin: Secondary | ICD-10-CM | POA: Diagnosis present

## 2023-04-18 DIAGNOSIS — Y712 Prosthetic and other implants, materials and accessory cardiovascular devices associated with adverse incidents: Secondary | ICD-10-CM | POA: Diagnosis present

## 2023-04-18 DIAGNOSIS — Y841 Kidney dialysis as the cause of abnormal reaction of the patient, or of later complication, without mention of misadventure at the time of the procedure: Secondary | ICD-10-CM | POA: Diagnosis present

## 2023-04-18 DIAGNOSIS — E785 Hyperlipidemia, unspecified: Secondary | ICD-10-CM | POA: Diagnosis present

## 2023-04-18 DIAGNOSIS — N390 Urinary tract infection, site not specified: Secondary | ICD-10-CM | POA: Diagnosis present

## 2023-04-18 DIAGNOSIS — Z87891 Personal history of nicotine dependence: Secondary | ICD-10-CM | POA: Diagnosis not present

## 2023-04-18 DIAGNOSIS — Z8711 Personal history of peptic ulcer disease: Secondary | ICD-10-CM | POA: Diagnosis not present

## 2023-04-18 DIAGNOSIS — Z8673 Personal history of transient ischemic attack (TIA), and cerebral infarction without residual deficits: Secondary | ICD-10-CM | POA: Diagnosis not present

## 2023-04-18 HISTORY — PX: REMOVAL OF A DIALYSIS CATHETER: SHX6053

## 2023-04-18 LAB — CBC
HCT: 33.7 % — ABNORMAL LOW (ref 39.0–52.0)
Hemoglobin: 10.8 g/dL — ABNORMAL LOW (ref 13.0–17.0)
MCH: 31.8 pg (ref 26.0–34.0)
MCHC: 32 g/dL (ref 30.0–36.0)
MCV: 99.1 fL (ref 80.0–100.0)
Platelets: 61 10*3/uL — ABNORMAL LOW (ref 150–400)
RBC: 3.4 MIL/uL — ABNORMAL LOW (ref 4.22–5.81)
RDW: 16.9 % — ABNORMAL HIGH (ref 11.5–15.5)
WBC: 5.2 10*3/uL (ref 4.0–10.5)
nRBC: 0 % (ref 0.0–0.2)

## 2023-04-18 LAB — BASIC METABOLIC PANEL
Anion gap: 13 (ref 5–15)
BUN: 46 mg/dL — ABNORMAL HIGH (ref 8–23)
CO2: 22 mmol/L (ref 22–32)
Calcium: 8.2 mg/dL — ABNORMAL LOW (ref 8.9–10.3)
Chloride: 104 mmol/L (ref 98–111)
Creatinine, Ser: 7.6 mg/dL — ABNORMAL HIGH (ref 0.61–1.24)
GFR, Estimated: 7 mL/min — ABNORMAL LOW (ref >60)
Glucose, Bld: 98 mg/dL (ref 70–99)
Potassium: 4.5 mmol/L (ref 3.5–5.1)
Sodium: 139 mmol/L (ref 135–145)

## 2023-04-18 LAB — POCT I-STAT, CHEM 8
BUN: 44 mg/dL — ABNORMAL HIGH (ref 8–23)
Calcium, Ion: 1.06 mmol/L — ABNORMAL LOW (ref 1.15–1.40)
Chloride: 103 mmol/L (ref 98–111)
Creatinine, Ser: 8.4 mg/dL — ABNORMAL HIGH (ref 0.61–1.24)
Glucose, Bld: 81 mg/dL (ref 70–99)
HCT: 35 % — ABNORMAL LOW (ref 39.0–52.0)
Hemoglobin: 11.9 g/dL — ABNORMAL LOW (ref 13.0–17.0)
Potassium: 4.4 mmol/L (ref 3.5–5.1)
Sodium: 138 mmol/L (ref 135–145)
TCO2: 26 mmol/L (ref 22–32)

## 2023-04-18 LAB — PROTIME-INR
INR: 1.5 — ABNORMAL HIGH (ref 0.8–1.2)
Prothrombin Time: 18.2 s — ABNORMAL HIGH (ref 11.4–15.2)

## 2023-04-18 LAB — APTT: aPTT: 31 s (ref 24–36)

## 2023-04-18 LAB — GLUCOSE, CAPILLARY: Glucose-Capillary: 143 mg/dL — ABNORMAL HIGH (ref 70–99)

## 2023-04-18 LAB — HEPATITIS B SURFACE ANTIGEN: Hepatitis B Surface Ag: NONREACTIVE

## 2023-04-18 SURGERY — REMOVAL, DIALYSIS CATHETER
Anesthesia: Monitor Anesthesia Care | Site: Chest | Laterality: Right

## 2023-04-18 MED ORDER — ORAL CARE MOUTH RINSE
15.0000 mL | Freq: Once | OROMUCOSAL | Status: AC
  Administered 2023-04-18: 15 mL via OROMUCOSAL

## 2023-04-18 MED ORDER — PROPOFOL 10 MG/ML IV BOLUS
INTRAVENOUS | Status: DC | PRN
  Administered 2023-04-18: 80 mg via INTRAVENOUS

## 2023-04-18 MED ORDER — SUGAMMADEX SODIUM 200 MG/2ML IV SOLN
INTRAVENOUS | Status: AC
  Filled 2023-04-18: qty 2

## 2023-04-18 MED ORDER — HEMOSTATIC AGENTS (NO CHARGE) OPTIME
TOPICAL | Status: DC | PRN
Start: 2023-04-18 — End: 2023-04-18
  Administered 2023-04-18: 1 via TOPICAL

## 2023-04-18 MED ORDER — FENTANYL CITRATE (PF) 250 MCG/5ML IJ SOLN
INTRAMUSCULAR | Status: AC
  Filled 2023-04-18: qty 5

## 2023-04-18 MED ORDER — ONDANSETRON HCL 4 MG/2ML IJ SOLN
INTRAMUSCULAR | Status: AC
  Filled 2023-04-18: qty 2

## 2023-04-18 MED ORDER — EPHEDRINE SULFATE-NACL 50-0.9 MG/10ML-% IV SOSY
PREFILLED_SYRINGE | INTRAVENOUS | Status: DC | PRN
  Administered 2023-04-18 (×2): 10 mg via INTRAVENOUS

## 2023-04-18 MED ORDER — FENTANYL CITRATE (PF) 100 MCG/2ML IJ SOLN: 25.0000 ug | INTRAMUSCULAR | Status: DC | PRN

## 2023-04-18 MED ORDER — ONDANSETRON HCL 4 MG/2ML IJ SOLN
INTRAMUSCULAR | Status: DC | PRN
  Administered 2023-04-18 (×2): 4 mg via INTRAVENOUS

## 2023-04-18 MED ORDER — ALBUTEROL SULFATE HFA 108 (90 BASE) MCG/ACT IN AERS
INHALATION_SPRAY | RESPIRATORY_TRACT | Status: DC | PRN
  Administered 2023-04-18: 4 via RESPIRATORY_TRACT

## 2023-04-18 MED ORDER — EPHEDRINE 5 MG/ML INJ
INTRAVENOUS | Status: AC
  Filled 2023-04-18: qty 5

## 2023-04-18 MED ORDER — LIDOCAINE HCL (PF) 1 % IJ SOLN
INTRAMUSCULAR | Status: AC
  Filled 2023-04-18: qty 30

## 2023-04-18 MED ORDER — ROCURONIUM BROMIDE 10 MG/ML (PF) SYRINGE
PREFILLED_SYRINGE | INTRAVENOUS | Status: AC
  Filled 2023-04-18: qty 10

## 2023-04-18 MED ORDER — ACETAMINOPHEN 10 MG/ML IV SOLN
INTRAVENOUS | Status: AC
  Filled 2023-04-18: qty 100

## 2023-04-18 MED ORDER — SODIUM CHLORIDE 0.9 % IV SOLN: INTRAVENOUS | Status: DC

## 2023-04-18 MED ORDER — DEXAMETHASONE SODIUM PHOSPHATE 10 MG/ML IJ SOLN
INTRAMUSCULAR | Status: DC | PRN
  Administered 2023-04-18: 5 mg via INTRAVENOUS

## 2023-04-18 MED ORDER — LIDOCAINE 2% (20 MG/ML) 5 ML SYRINGE
INTRAMUSCULAR | Status: DC | PRN
  Administered 2023-04-18: 20 mg via INTRAVENOUS

## 2023-04-18 MED ORDER — CHLORHEXIDINE GLUCONATE CLOTH 2 % EX PADS: 6.0000 | MEDICATED_PAD | Freq: Every day | CUTANEOUS | Status: DC

## 2023-04-18 MED ORDER — PHENYLEPHRINE 80 MCG/ML (10ML) SYRINGE FOR IV PUSH (FOR BLOOD PRESSURE SUPPORT)
PREFILLED_SYRINGE | INTRAVENOUS | Status: AC
  Filled 2023-04-18: qty 10

## 2023-04-18 MED ORDER — SODIUM CHLORIDE 0.9 % IV SOLN
INTRAVENOUS | Status: DC | PRN
Start: 2023-04-18 — End: 2023-04-18

## 2023-04-18 MED ORDER — PHENYLEPHRINE 80 MCG/ML (10ML) SYRINGE FOR IV PUSH (FOR BLOOD PRESSURE SUPPORT)
PREFILLED_SYRINGE | INTRAVENOUS | Status: DC | PRN
  Administered 2023-04-18: 160 ug via INTRAVENOUS

## 2023-04-18 MED ORDER — PHENYLEPHRINE HCL-NACL 20-0.9 MG/250ML-% IV SOLN
INTRAVENOUS | Status: DC | PRN
  Administered 2023-04-18: 40 ug/min via INTRAVENOUS

## 2023-04-18 MED ORDER — DEXAMETHASONE SODIUM PHOSPHATE 10 MG/ML IJ SOLN
INTRAMUSCULAR | Status: AC
  Filled 2023-04-18: qty 1

## 2023-04-18 MED ORDER — ALBUMIN HUMAN 5 % IV SOLN: INTRAVENOUS | Status: DC | PRN

## 2023-04-18 MED ORDER — 0.9 % SODIUM CHLORIDE (POUR BTL) OPTIME
TOPICAL | Status: DC | PRN
  Administered 2023-04-18: 1000 mL

## 2023-04-18 MED ORDER — CEFAZOLIN SODIUM-DEXTROSE 2-3 GM-%(50ML) IV SOLR
INTRAVENOUS | Status: DC | PRN
  Administered 2023-04-18: 2 g via INTRAVENOUS

## 2023-04-18 MED ORDER — PROPOFOL 10 MG/ML IV BOLUS
INTRAVENOUS | Status: AC
  Filled 2023-04-18: qty 20

## 2023-04-18 MED ORDER — CEFAZOLIN SODIUM 1 G IJ SOLR
INTRAMUSCULAR | Status: AC
  Filled 2023-04-18: qty 20

## 2023-04-18 MED ORDER — LIDOCAINE 2% (20 MG/ML) 5 ML SYRINGE
INTRAMUSCULAR | Status: AC
  Filled 2023-04-18: qty 5

## 2023-04-18 MED ORDER — SUGAMMADEX SODIUM 200 MG/2ML IV SOLN
INTRAVENOUS | Status: DC | PRN
  Administered 2023-04-18: 150 mg via INTRAVENOUS

## 2023-04-18 MED ORDER — FENTANYL CITRATE (PF) 250 MCG/5ML IJ SOLN
INTRAMUSCULAR | Status: DC | PRN
  Administered 2023-04-18: 25 ug via INTRAVENOUS

## 2023-04-18 MED ORDER — CHLORHEXIDINE GLUCONATE 0.12 % MT SOLN: 15.0000 mL | Freq: Once | OROMUCOSAL | Status: AC

## 2023-04-18 MED ORDER — ROCURONIUM BROMIDE 10 MG/ML (PF) SYRINGE
PREFILLED_SYRINGE | INTRAVENOUS | Status: DC | PRN
  Administered 2023-04-18: 10 mg via INTRAVENOUS
  Administered 2023-04-18: 30 mg via INTRAVENOUS

## 2023-04-18 MED ORDER — ALLOPURINOL 100 MG PO TABS
100.0000 mg | ORAL_TABLET | Freq: Every day | ORAL | Status: DC
  Administered 2023-04-18: 100 mg via ORAL
  Filled 2023-04-18: qty 1

## 2023-04-18 MED ORDER — DROPERIDOL 2.5 MG/ML IJ SOLN: 0.6250 mg | Freq: Once | INTRAMUSCULAR | Status: DC | PRN

## 2023-04-18 SURGICAL SUPPLY — 42 items
BAG DECANTER FOR FLEXI CONT (MISCELLANEOUS) ×1 IMPLANT
BENZOIN TINCTURE PRP APPL 2/3 (GAUZE/BANDAGES/DRESSINGS) ×1 IMPLANT
BIOPATCH RED 1 DISK 7.0 (GAUZE/BANDAGES/DRESSINGS) ×1 IMPLANT
CATH PALINDROME-P 19CM W/VT (CATHETERS) IMPLANT
CATH PALINDROME-P 23CM W/VT (CATHETERS) IMPLANT
CATH PALINDROME-P 28CM W/VT (CATHETERS) IMPLANT
COVER PROBE W GEL 5X96 (DRAPES) ×1 IMPLANT
COVER SURGICAL LIGHT HANDLE (MISCELLANEOUS) ×1 IMPLANT
DERMABOND ADVANCED .7 DNX12 (GAUZE/BANDAGES/DRESSINGS) IMPLANT
DRAPE C-ARM 42X72 X-RAY (DRAPES) ×1 IMPLANT
DRAPE CHEST BREAST 15X10 FENES (DRAPES) ×1 IMPLANT
GAUZE 4X4 16PLY ~~LOC~~+RFID DBL (SPONGE) ×1 IMPLANT
GAUZE SPONGE 4X4 12PLY STRL (GAUZE/BANDAGES/DRESSINGS) ×1 IMPLANT
GLOVE BIO SURGEON STRL SZ8 (GLOVE) ×1 IMPLANT
GOWN STRL REUS W/ TWL LRG LVL3 (GOWN DISPOSABLE) ×1 IMPLANT
GOWN STRL REUS W/ TWL XL LVL3 (GOWN DISPOSABLE) ×1 IMPLANT
HEMOSTAT SNOW SURGICEL 2X4 (HEMOSTASIS) IMPLANT
KIT BASIN OR (CUSTOM PROCEDURE TRAY) ×1 IMPLANT
KIT PALINDROME-P 55CM (CATHETERS) IMPLANT
KIT TURNOVER KIT B (KITS) ×1 IMPLANT
NDL 18GX1X1/2 (RX/OR ONLY) (NEEDLE) ×1 IMPLANT
NDL HYPO 25GX1X1/2 BEV (NEEDLE) ×1 IMPLANT
NEEDLE 18GX1X1/2 (RX/OR ONLY) (NEEDLE) ×1 IMPLANT
NEEDLE HYPO 25GX1X1/2 BEV (NEEDLE) ×1 IMPLANT
NS IRRIG 1000ML POUR BTL (IV SOLUTION) ×1 IMPLANT
PACK SRG BSC III STRL LF ECLPS (CUSTOM PROCEDURE TRAY) ×1 IMPLANT
PAD ARMBOARD POSITIONER FOAM (MISCELLANEOUS) ×2 IMPLANT
SOAP 2 % CHG 4 OZ (WOUND CARE) ×1 IMPLANT
STRIP CLOSURE SKIN 1/2X4 (GAUZE/BANDAGES/DRESSINGS) ×1 IMPLANT
SUT ETHILON 3 0 PS 1 (SUTURE) ×1 IMPLANT
SUT MNCRL AB 4-0 PS2 18 (SUTURE) ×1 IMPLANT
SUT PROLENE 5 0 C 1 24 (SUTURE) IMPLANT
SUT SILK 2-0 18XBRD TIE 12 (SUTURE) IMPLANT
SUT VIC AB 3-0 SH 27X BRD (SUTURE) IMPLANT
SYR 10ML LL (SYRINGE) ×1 IMPLANT
SYR 20ML LL LF (SYRINGE) ×2 IMPLANT
SYR 5ML LL (SYRINGE) ×1 IMPLANT
SYR CONTROL 10ML LL (SYRINGE) ×1 IMPLANT
TAPE CLOTH SURG 4X10 WHT LF (GAUZE/BANDAGES/DRESSINGS) IMPLANT
TOWEL GREEN STERILE (TOWEL DISPOSABLE) ×1 IMPLANT
TOWEL GREEN STERILE FF (TOWEL DISPOSABLE) ×2 IMPLANT
WATER STERILE IRR 1000ML POUR (IV SOLUTION) ×1 IMPLANT

## 2023-04-18 NOTE — Anesthesia Procedure Notes (Signed)
 Procedure Name: Intubation Date/Time: 04/18/2023 7:55 AM  Performed by: Elliot Dally, CRNAPre-anesthesia Checklist: Patient identified, Emergency Drugs available, Suction available and Patient being monitored Patient Re-evaluated:Patient Re-evaluated prior to induction Oxygen Delivery Method: Circle System Utilized Preoxygenation: Pre-oxygenation with 100% oxygen Induction Type: IV induction Ventilation: Mask ventilation without difficulty Laryngoscope Size: Miller and 2 Grade View: Grade I Tube type: Oral Tube size: 7.0 mm Number of attempts: 1 Airway Equipment and Method: Stylet and Oral airway Placement Confirmation: ETT inserted through vocal cords under direct vision, positive ETCO2 and breath sounds checked- equal and bilateral Secured at: 22 cm Tube secured with: Tape Dental Injury: Teeth and Oropharynx as per pre-operative assessment

## 2023-04-18 NOTE — Care Management Obs Status (Signed)
 MEDICARE OBSERVATION STATUS NOTIFICATION   Patient Details  Name: Taylor Willis MRN: [id number] Date of Birth: 06/16/1940   Medicare Observation Status Notification Given:  Yes    Ronny Bacon, RN 04/18/2023, 2:05 PM

## 2023-04-18 NOTE — Anesthesia Postprocedure Evaluation (Signed)
 Anesthesia Post Note  Patient: Taylor Willis  Procedure(s) Performed: REMOVAL, DIALYSIS CATHETER and Repair of Right Jugular (Right: Chest)     Patient location during evaluation: PACU Anesthesia Type: General Level of consciousness: awake and alert Pain management: pain level controlled Vital Signs Assessment: post-procedure vital signs reviewed and stable Respiratory status: spontaneous breathing, nonlabored ventilation, respiratory function stable and patient connected to nasal cannula oxygen Cardiovascular status: blood pressure returned to baseline and stable Postop Assessment: no apparent nausea or vomiting Anesthetic complications: no  No notable events documented.  Last Vitals:  Vitals:   04/18/23 1042 04/18/23 1244  BP: (!) 171/84 (!) 150/63  Pulse: 63   Resp:    Temp:    SpO2:      Last Pain:  Vitals:   04/18/23 1000  TempSrc: Oral  PainSc:                  Kennieth Rad

## 2023-04-18 NOTE — Plan of Care (Addendum)
 Patient alert and oriented to person. S/p RACW permacath removal and direct repair of right jugular vein. Dried drainage to chest dressing, marked. Small drainage to neck, marked. LUA AVF. Dressing to gluteal changed. Noted three drained blisters to right back. LBM today. SCDs in place. Patient instructed on incentive spirometer. Safety precautions maintained.   Problem: Education: Goal: Knowledge of General Education information will improve Description: Including pain rating scale, medication(s)/side effects and non-pharmacologic comfort measures Outcome: Progressing   Problem: Clinical Measurements: Goal: Respiratory complications will improve Outcome: Progressing   Problem: Activity: Goal: Risk for activity intolerance will decrease Outcome: Progressing   Problem: Pain Managment: Goal: General experience of comfort will improve and/or be controlled Outcome: Progressing

## 2023-04-18 NOTE — Transfer of Care (Signed)
 Immediate Anesthesia Transfer of Care Note  Patient: Taylor Willis  Procedure(s) Performed: REMOVAL, DIALYSIS CATHETER and Repair of Right Jugular (Right: Chest)  Patient Location: PACU  Anesthesia Type:General  Level of Consciousness: drowsy  Airway & Oxygen Therapy: Patient Spontanous Breathing and Patient connected to face mask oxygen  Post-op Assessment: Report given to RN and Post -op Vital signs reviewed and stable  Post vital signs: Reviewed and stable  Last Vitals:  Vitals Value Taken Time  BP 133/92 04/18/23 0900  Temp    Pulse 66 04/18/23 0905  Resp 16 04/18/23 0905  SpO2 100 % 04/18/23 0905  Vitals shown include unfiled device data.  Last Pain:  Vitals:   04/18/23 0712  TempSrc: Oral  PainSc:          Complications: No notable events documented.

## 2023-04-18 NOTE — Progress Notes (Signed)
 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  He had been evaluated the day prior by IR who was unable to remove his existing catheter.  He was to return for reevaluation 3/17 but experienced ongoing bleeding at the catheter site prompting him to present to the ER.  At presentation his hemoglobin was 13.9, he was hemodynamically stable, and clinically there were no indications for emergent/acute dialysis.  The patient was transferred to [facility] where he could be evaluated by vascular surgery.   Goals of Care:   Code Status: Full Code   DVT prophylaxis: apixaban (ELIQUIS) tablet 2.5 mg Start: 04/19/23 1000 TED hose Start: 04/17/23 1324 SCDs Start: 04/17/23 1324 apixaban (ELIQUIS) tablet 2.5 mg   Interim Hx: Afebrile since admission.  Vital signs stable.  In atrial fibrillation with controlled ventricular response.  The patient is resting comfortably in his room postoperatively.  He has no new complaints.  No acute complications were encountered postoperatively.  She denies shortness of breath chest pain nausea vomiting or abdominal pain. He has not noticed any further bleeding from his catheter site.  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 -monitor trend  ESRD on HD TTS Nephrology attending to ongoing  inpatient dialysis  Acute blood loss 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 Hemoglobin stabilizing - has not required blood transfusion  - erythropoietin and iron at discretion of Nephrology  Recently diagnosed UTI Completing oral course of antibiotic as prescribed prior to admission  Atrial fibrillation On Eliquis therapy as outpatient - Eliquis on hold for now with plan to resume tomorrow - continue beta-blocker  History of CVA On chronic Eliquis which has been on hold due to hematoma   Prostate cancer In remission  Family Communication: Spoke with spouse at bedside Disposition: Possible discharge home in next 24-48 hours   Objective: Blood pressure (!) 150/63, pulse 63, temperature 98.1 F (36.7 C), temperature source Oral, resp. rate 18, height 6' 0.01" (1.829 m), weight 72 kg, SpO2 92%.  Intake/Output Summary (Last 24 hours) at 04/18/2023 1657 Last data filed at 04/18/2023 0854 Gross per 24 hour  Intake 820 ml  Output 25 ml  Net 795 ml   Filed Weights   04/17/23 1212  Weight: 72 kg    Examination: General: No acute respiratory distress Lungs: Clear to auscultation bilaterally without wheezes or crackles Cardiovascular: Regular rate and rhythm without murmur gallop or rub normal S1 and S2 Abdomen: Nontender, nondistended, soft, bowel sounds positive, no rebound, no ascites, no appreciable mass Extremities: No significant cyanosis, clubbing, or edema bilateral lower extremities  CBC: Recent Labs  Lab 04/17/23 1208 04/17/23 1249 04/17/23 2226 04/18/23 0600 04/18/23 0723  WBC 5.4  --  6.8 5.2  --   NEUTROABS 4.0  --   --   --   --  HGB 12.0*   < > 11.4* 10.8* 11.9*  HCT 38.0*   < > 35.4* 33.7* 35.0*  MCV 101.1*  --  99.2 99.1  --   PLT 71*  --  65* 61*  --    < > = values in this interval not displayed.   Basic Metabolic Panel: Recent Labs  Lab 04/17/23 1208 04/17/23 1249 04/17/23 1324 04/18/23 0723 04/18/23 1036  NA 139 138  --  138 139  K 4.1 4.1  --  4.4 4.5   CL 99 102  --  103 104  CO2 25  --   --   --  22  GLUCOSE 124* 114*  --  81 98  BUN 44* 40*  --  44* 46*  CREATININE 6.72* 7.20*  --  8.40* 7.60*  CALCIUM 8.8*  --   --   --  8.2*  MG  --   --  2.3  --   --   PHOS  --   --  3.7  --   --    GFR: Estimated Creatinine Clearance: 7.6 mL/min (A) (by C-G formula based on SCr of 7.6 mg/dL (H)).   Scheduled Meds:  allopurinol  100 mg Oral QHS   [START ON 04/19/2023] apixaban  2.5 mg Oral BID   [START ON 04/20/2023] calcitRIOL  0.25 mcg Oral Q T,Th,Sa-HD   carvedilol  3.125 mg Oral 2 times per day on Sunday Monday Wednesday Friday   And   [START ON 04/19/2023] carvedilol  3.125 mg Oral Once per day on Tuesday Thursday Saturday   cefUROXime  500 mg Oral Once per day on Tuesday Thursday Saturday   [START ON 04/19/2023] Chlorhexidine Gluconate Cloth  6 each Topical Q0600   furosemide  120 mg Oral Daily   multivitamin  1 tablet Oral QHS   rosuvastatin  5 mg Oral QHS   sertraline  25 mg Oral Daily   sevelamer carbonate  2.4 g Oral TID WC   sodium chloride flush  3 mL Intravenous Q12H     LOS: 0 days   Lonia Blood, MD [facility] Office  [phone] Pager - Text Page per Loretha Stapler  If 7PM-7AM, please contact night-coverage per Amion 04/18/2023, 4:57 PM

## 2023-04-18 NOTE — Op Note (Addendum)
 DATE OF SERVICE: 04/18/2023  PATIENT:  Taylor Willis  83 y.o. male  PRE-OPERATIVE DIAGNOSIS:  bleeding tunneled dialysis catheter  POST-OPERATIVE DIAGNOSIS:  Same  PROCEDURE:   1) removal of tunneled dialysis catheter 2) direct repair of right jugular vein  SURGEON:  Surgeons and Role:    * Leonie Douglas, MD - Primary  ASSISTANT: none  ANESTHESIA:   general  EBL: 50mL  BLOOD ADMINISTERED:none  DRAINS: none   LOCAL MEDICATIONS USED:  NONE  SPECIMEN:  none  COUNTS: confirmed correct.  TOURNIQUET:  none  PATIENT DISPOSITION:  PACU - hemodynamically stable.   Delay start of Pharmacological VTE agent (>24hrs) due to surgical blood loss or risk of bleeding: no  INDICATION FOR PROCEDURE: Taylor Willis is a 83 y.o. male with end-stage renal disease dialyzing through right internal jugular tunneled dialysis catheter.  This malfunctioned over the past several weeks.  I catheter exchange was attempted, but the cuff was too deep to allow retrieval under moderate sedation.  The patient's had persistent bleeding since.  He presented to the ER for evaluation. After careful discussion of risks, benefits, and alternatives the patient was offered  tunneled dialysis catheter removal. The patient's wife, who gave consent, understood and wished to proceed.  OPERATIVE FINDINGS: The cuff of the catheter was abutting the jugular vein.  The jugular vein did need to be repaired with a simple stitch of 5-0 Prolene.  This rendered the wound hemostatic.  There was diffuse ooze from chronic thrombocytopenia.  DESCRIPTION OF PROCEDURE: After identification of the patient in the pre-operative holding area, the patient was transferred to the operating room. The patient was positioned supine on the operating room table. Anesthesia was induced. The right neck and chest were prepped and draped in standard fashion. A surgical pause was performed confirming correct patient, procedure, and operative  location.  Transverse incision of the neck was made over the catheter.  I felt the cuff of the catheter deep in the neck.  The catheter was exposed and skeletonized down to the jugular venotomy.  I was able to free the cuff and remove the catheter intact.  The hole in the jugular vein was then repaired with a simple stitch of 5-0 Prolene.  Diffuse oozing was noted from the surgical bed.  The wound was packed with Surgicel snow.  The exit site was packed with Surgicel snow.  The neck incision was closed with 3-0 Vicryl and 4-0 Monocryl.  The catheter exit site was partially closed with 3-0 nylon suture in horizontal mattress technique.  Upon completion of the case instrument and sharps counts were confirmed correct. The patient was transferred to the PACU in good condition. I was present for all portions of the procedure.  FOLLOW UP PLAN: Use left arm fistula for dialysis.  Patient cannot have right jugular approach TDC.  If the [facility] is necessary left jugular approach should be attempted.  Rande Brunt. Lenell Antu, MD [facility] Phone Number: [phone] 04/18/2023 8:45 AM

## 2023-04-18 NOTE — Anesthesia Preprocedure Evaluation (Addendum)
 Anesthesia Evaluation  Patient identified by MRN, date of birth, ID band Patient awake    Reviewed: Allergy & Precautions, NPO status , Patient's Chart, lab work & pertinent test results  Airway Mallampati: II  TM Distance: >3 FB Neck ROM: Full    Dental   Pulmonary asthma , former smoker   breath sounds clear to auscultation       Cardiovascular hypertension, Pt. on medications  Rhythm:Regular Rate:Normal     Neuro/Psych CVA    GI/Hepatic Neg liver ROS,GERD  ,,  Endo/Other  negative endocrine ROS    Renal/GU Renal disease     Musculoskeletal   Abdominal   Peds  Hematology  (+) Blood dyscrasia, anemia   Anesthesia Other Findings   Reproductive/Obstetrics                             Anesthesia Physical Anesthesia Plan  ASA: 4  Anesthesia Plan: General   Post-op Pain Management: Ofirmev IV (intra-op)*   Induction: Intravenous  PONV Risk Score and Plan: 2 and Ondansetron and Dexamethasone  Airway Management Planned: Oral ETT  Additional Equipment:   Intra-op Plan:   Post-operative Plan: Extubation in OR  Informed Consent: I have reviewed the patients History and Physical, chart, labs and discussed the procedure including the risks, benefits and alternatives for the proposed anesthesia with the patient or authorized representative who has indicated his/her understanding and acceptance.     Consent reviewed with POA  Plan Discussed with: CRNA  Anesthesia Plan Comments: (Surgeon request GA for potential for cutdown.)       Anesthesia Quick Evaluation

## 2023-04-18 NOTE — Progress Notes (Signed)
 Patient seen-  still on observation care.  Appreciate so much Dr. Lenell Antu managing the [facility] situation-  catheter was imbedded in the vein-  was removed but needed vein repair.  Dr. Lenell Antu noted that patient appears to have a patent AVF and it is unclear why it was not being used.  Patient is unable to tell me-  is confused-  not sure if this is typical or not.    So, the plan will be to attempt HD tomorrow and use the Left arm AVF.  If it is successful and he is otherwise OK he could be discharged and go to his regular HD unit on Tuesday.  If are not able to use the AVF will need another plan for access  Taylor Willis

## 2023-04-18 NOTE — Progress Notes (Signed)
 Patient ID: Taylor Willis, male   DOB: 1940/04/05, 83 y.o.   MRN: [id number]  Seen in preoperative area.  No real change in the appearance of the tunneled dialysis catheter or its exit site.  The patient has comfortable and has no questions.  Plan remove tunneled dialysis catheter.  Concerned that the cuff is abutting or into the jugular vein and a jugular vein repair may be needed.  We will do this under general anesthesia, as discussed with the anesthesia team.  He does have chronic thrombocytopenia, but is above 50,000.  We may need platelet transfusion should bleeding become a problem.  I am suspicious thrombocytopenia is the cause of his prolonged bleeding.  Rande Brunt. Lenell Antu, MD [facility] Phone Number: [phone] 04/18/2023 7:42 AM

## 2023-04-19 ENCOUNTER — Encounter (HOSPITAL_COMMUNITY): Payer: Self-pay | Admitting: Vascular Surgery

## 2023-04-19 ENCOUNTER — Inpatient Hospital Stay (HOSPITAL_COMMUNITY)
Admission: RE | Admit: 2023-04-19 | Discharge: 2023-04-19 | Disposition: A | Discharge status: Home / Self Care | Source: Ambulatory Visit | Attending: Nurse Practitioner | Admitting: Nurse Practitioner

## 2023-04-19 ENCOUNTER — Ambulatory Visit (HOSPITAL_COMMUNITY): Admit: 2023-04-19 | Admitting: Nephrology

## 2023-04-19 ENCOUNTER — Encounter (HOSPITAL_COMMUNITY): Payer: Self-pay

## 2023-04-19 DIAGNOSIS — T82898A Other specified complication of vascular prosthetic devices, implants and grafts, initial encounter: Secondary | ICD-10-CM | POA: Diagnosis not present

## 2023-04-19 LAB — CBC
HCT: 30.5 % — ABNORMAL LOW (ref 39.0–52.0)
Hemoglobin: 9.7 g/dL — ABNORMAL LOW (ref 13.0–17.0)
MCH: 31.4 pg (ref 26.0–34.0)
MCHC: 31.8 g/dL (ref 30.0–36.0)
MCV: 98.7 fL (ref 80.0–100.0)
Platelets: 53 10*3/uL — ABNORMAL LOW (ref 150–400)
RBC: 3.09 MIL/uL — ABNORMAL LOW (ref 4.22–5.81)
RDW: 16.7 % — ABNORMAL HIGH (ref 11.5–15.5)
WBC: 5.4 10*3/uL (ref 4.0–10.5)
nRBC: 0 % (ref 0.0–0.2)

## 2023-04-19 LAB — BASIC METABOLIC PANEL
Anion gap: 13 (ref 5–15)
BUN: 53 mg/dL — ABNORMAL HIGH (ref 8–23)
CO2: 21 mmol/L — ABNORMAL LOW (ref 22–32)
Calcium: 8 mg/dL — ABNORMAL LOW (ref 8.9–10.3)
Chloride: 102 mmol/L (ref 98–111)
Creatinine, Ser: 8.36 mg/dL — ABNORMAL HIGH (ref 0.61–1.24)
GFR, Estimated: 6 mL/min — ABNORMAL LOW (ref >60)
Glucose, Bld: 113 mg/dL — ABNORMAL HIGH (ref 70–99)
Potassium: 4.6 mmol/L (ref 3.5–5.1)
Sodium: 136 mmol/L (ref 135–145)

## 2023-04-19 SURGERY — TUNNELLED CATHETER EXCHANGE

## 2023-04-19 MED ORDER — LIDOCAINE-PRILOCAINE 2.5-2.5 % EX CREA: 1.0000 | TOPICAL_CREAM | CUTANEOUS | Status: DC | PRN

## 2023-04-19 MED ORDER — HEPARIN SODIUM (PORCINE) 1000 UNIT/ML DIALYSIS: 1000.0000 [IU] | INTRAMUSCULAR | Status: DC | PRN

## 2023-04-19 MED ORDER — ANTICOAGULANT SODIUM CITRATE 4% (200MG/5ML) IV SOLN: 5.0000 mL | Status: DC | PRN

## 2023-04-19 MED ORDER — ALTEPLASE 2 MG IJ SOLR: 2.0000 mg | Freq: Once | INTRAMUSCULAR | Status: DC | PRN

## 2023-04-19 MED ORDER — LIDOCAINE HCL (PF) 1 % IJ SOLN: 5.0000 mL | INTRAMUSCULAR | Status: DC | PRN

## 2023-04-19 MED ORDER — PENTAFLUOROPROP-TETRAFLUOROETH EX AERO: 1.0000 | INHALATION_SPRAY | CUTANEOUS | Status: DC | PRN

## 2023-04-19 MED ORDER — OXYCODONE HCL 5 MG PO TABS: 5.0000 mg | ORAL_TABLET | ORAL | Status: DC | PRN

## 2023-04-19 NOTE — TOC Transition Note (Signed)
 Transition of Care [facility]) - Discharge Note   Patient Details  Name: Taylor Willis MRN: [id number] Date of Birth: 16-Feb-1940  Transition of Care [facility]) CM/SW Contact:  Tom-Johnson, Hershal Coria, RN Phone Number: 04/19/2023, 1:46 PM   Clinical Narrative:     Patient is scheduled for discharge today.  Readmission Risk Assessment done. Outpatient f/u, hospital f/u and discharge instructions on AVS. No TOC needs or recommendations noted. Wife, Kendal Hymen to transport at discharge.  No further TOC needs noted.       Final next level of care: Home/Self Care Barriers to Discharge: Barriers Resolved   Patient Goals and CMS Choice Patient states their goals for this hospitalization and ongoing recovery are:: To return home CMS Medicare.gov Compare Post Acute Care list provided to:: Patient Choice offered to / list presented to : NA      Discharge Placement                Patient to be transferred to facility by: Wife Name of family member notified: [facility]    Discharge Plan and Services Additional resources added to the After Visit Summary for                  DME Arranged: N/A DME Agency: NA       HH Arranged: NA HH Agency: NA        Social Drivers of Health (SDOH) Interventions SDOH Screenings   Food Insecurity: No Food Insecurity (04/17/2023)  Housing: Low Risk  (04/17/2023)  Transportation Needs: No Transportation Needs (04/17/2023)  Utilities: Not At Risk (04/17/2023)  Financial Resource Strain: Low Risk  (02/27/2022)   [facility] from [facility], [facility]  Physical Activity: Inactive (02/27/2022)   Received from [facility], [facility]  Social Connections: Socially Integrated (04/17/2023)  Stress: No Stress Concern Present (02/27/2022)   Received from [facility], [facility]  Tobacco Use: Medium Risk (04/18/2023)  Health Literacy: Medium Risk (02/27/2022)   Received from [facility], [facility]     Readmission Risk  Interventions    04/19/2023    1:42 PM  Readmission Risk Prevention Plan  Transportation Screening Complete  Medication Review (RN Care Manager) Referral to Pharmacy  PCP or Specialist appointment within 3-5 days of discharge Complete  HRI or Home Care Consult Complete  Palliative Care Screening Not Applicable  Skilled Nursing Facility Not Applicable

## 2023-04-19 NOTE — Discharge Summary (Signed)
 DISCHARGE SUMMARY  Taylor Willis  MR#: [id number]  DOB:March 01, 1940  Date of Admission: 04/17/2023 Date of Discharge: 04/19/2023  Attending Physician:Ameshia Pewitt Silvestre Gunner, MD  Patient's VHQ:[id number], Philbert Riser, MD  Disposition: DC home   Follow-up Appts:  Follow-up Information     Donetta Potts, MD Follow up in 1 week(s).   Specialty: Internal Medicine Contact information: [address] 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 He had been evaluated the day prior by IR who was unable to remove his existing catheter. He was to return for reevaluation 3/17 but experienced ongoing bleeding at the catheter site prompting him to present to the ER. At presentation his hemoglobin was 13.9, he was hemodynamically stable, and clinically there were no indications for emergent/acute dialysis. The patient was transferred to [facility] where he could be evaluated by vascular surgery.   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 at least approximately 1 year - stable at present   ESRD on HD TTS Nephrology attending to ongoing inpatient dialysis -hopeful patient will tolerate HD through AV graft so that new dialysis catheter will not have to be placed   Acute blood loss 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 has not required blood transfusion  - erythropoietin and iron at discretion of Nephrology -monitor trend with expected drop in hemoglobin on day of d/c - will need CBC at HD 3/18    Recently diagnosed UTI Completing oral course of antibiotic as prescribed prior to admission   Atrial fibrillation On Eliquis therapy as outpatient - Eliquis resumed 3/17 per Vascular Surgery  Acute delirium - sundowning  Experienced some mild confusion the night of 3/16-3/17 - no agitation - confusion was imprving as the day went on - discussed w/ his wife in detail - feel this will likely improve more rapidly at home - wife is comfortable taking him home, and has help from a healthy son at home    History of CVA No change in usual medical tx    Prostate cancer In remission  Allergies as of 04/19/2023       Reactions   Aspirin Other (See Comments)   GI irritation   Hydrochlorothiazide    Dizziness and unable to walk   Thiazide-type Diuretics Other (See Comments)   Dizziness and unable to  walk   Ibuprofen Other (See Comments)   GI irritation        Medication List     TAKE these medications    acetaminophen 500 MG tablet Commonly known as: TYLENOL Take 500 mg by mouth every 6 (six) hours as needed for moderate pain.   albuterol 108 (90 Base) MCG/ACT inhaler Commonly known as: VENTOLIN HFA Inhale 2 puffs into the lungs every 6 (six) hours as needed (Asthma).   allopurinol 100 MG tablet Commonly known as: ZYLOPRIM Take 100 mg by mouth at bedtime.   Eliquis 5 MG Tabs tablet Generic drug:  apixaban Take 2.5 mg by mouth See admin instructions. Take 2.5 mg by mouth on Tues., Thurs.,and Sat, in the evening @2000  and 2.5 mg rest of days during the morning only @0930  per spouse   apixaban 2.5 MG Tabs tablet Commonly known as: ELIQUIS Take 1 tablet (2.5 mg total) by mouth See admin instructions. Take 2.5 mg Tues.Ronda Fairly Sat, in the evening Take 2.5 mg on Monday, Wednesday, Friday and Sunday twice a day   calcitRIOL 0.25 MCG capsule Commonly known as: ROCALTROL Take 0.25 mcg by mouth Every Tuesday,Thursday,and Saturday with dialysis.   carboxymethylcellulose 1 % ophthalmic solution Place 1 drop into both eyes daily as needed (Dry eyes).   carvedilol 3.125 MG tablet Commonly known as: COREG Take 1 tablet (3.125 mg total) by mouth See admin instructions. Take 1 tablet in the evenings on Dialysis days (Tues, Thurs, and Sat), On Non-dialysis days take 1 tablet twice a day. What changed:  when to take this additional instructions   cefdinir 300 MG capsule Commonly known as: OMNICEF Take 300 mg by mouth 2 (two) times daily.   cyanocobalamin 1000 MCG tablet Commonly known as: VITAMIN B12 Take 1,000 mcg by mouth daily.   furosemide 40 MG tablet Commonly known as: LASIX Take 120 mg by mouth daily.   Triphrocaps 1 MG Caps Take 1 capsule by mouth daily.   multivitamin Tabs tablet Take 1 tablet by mouth at bedtime.   rosuvastatin 5 MG tablet Commonly known as: CRESTOR Take 5 mg by mouth at bedtime.   sertraline 25 MG tablet Commonly known as: ZOLOFT Take 25 mg by mouth daily.   sevelamer carbonate 2.4 g Pack Commonly known as: RENVELA Take 2.4 g by mouth 3 (three) times daily with meals.   traZODone 50 MG tablet Commonly known as: DESYREL Take 25 mg by mouth at bedtime.        Day of Discharge BP (!) 157/71   Pulse (!) 56   Temp (!) 97.3 F (36.3 C)   Resp 18   Ht 6' 0.01" (1.829 m)   Wt 73.8 kg   SpO2 98%   BMI 22.06 kg/m   Physical  Exam: General: No acute respiratory distress Chest: ecchymosis of R base of neck and superior chest, w/ hematoma at prior cath site - no active bleeding  Lungs: Clear to auscultation bilaterally without wheezes or crackles Cardiovascular: Regular rate and rhythm without murmur gallop or rub normal S1 and S2 Abdomen: Nontender, nondistended, soft, bowel sounds positive, no rebound, no ascites, no appreciable mass Extremities: No significant cyanosis, clubbing, or edema bilateral lower extremities  Basic Metabolic Panel: Recent Labs  Lab 04/17/23 1208 04/17/23 1249 04/17/23 1324 04/18/23 0723 04/18/23 1036 04/19/23 0352  NA 139 138  --  138 139 136  K 4.1 4.1  --  4.4 4.5 4.6  CL 99 102  --  103 104 102  CO2 25  --   --   --  22 21*  GLUCOSE 124* 114*  --  81 98 113*  BUN 44* 40*  --  44* 46* 53*  CREATININE 6.72* 7.20*  --  8.40* 7.60* 8.36*  CALCIUM 8.8*  --   --   --  8.2* 8.0*  MG  --   --  2.3  --   --   --   PHOS  --   --  3.7  --   --   --     CBC: Recent Labs  Lab 04/17/23 1208 04/17/23 1249 04/17/23 2226 04/18/23 0600 04/18/23 0723 04/19/23 0352  WBC 5.4  --  6.8 5.2  --  5.4  NEUTROABS 4.0  --   --   --   --   --   HGB 12.0* 13.9 11.4* 10.8* 11.9* 9.7*  HCT 38.0* 41.0 35.4* 33.7* 35.0* 30.5*  MCV 101.1*  --  99.2 99.1  --  98.7  PLT 71*  --  65* 61*  --  53*    Time spent in discharge (includes decision making & examination of pt): 35 minutes  04/19/2023, 1:21 PM   Lonia Blood, MD [facility] Office  [phone]

## 2023-04-19 NOTE — Consult Note (Signed)
 WOC Nurse Consult Note: Reason for Consult: Requested to assess a sacral wound Wound type: Pressure injury. On the nursing assessment, there is no sign of injury. Skin intact with no redness. Pt is able to move and is almost ready for DC.  WOC team will not plan to follow further.  Please reconsult if further assistance is needed. Thank-you,  Denyse Amass BSN, RN, [employer], WOC  (Pager: [phone])

## 2023-04-19 NOTE — Plan of Care (Signed)
  Problem: Education: Goal: Knowledge of General Education information will improve Description: Including pain rating scale, medication(s)/side effects and non-pharmacologic comfort measures Outcome: Adequate for Discharge   Problem: Health Behavior/Discharge Planning: Goal: Ability to manage health-related needs will improve Outcome: Adequate for Discharge   Problem: Clinical Measurements: Goal: Ability to maintain clinical measurements within normal limits will improve Outcome: Adequate for Discharge Goal: Will remain free from infection Outcome: Adequate for Discharge Goal: Diagnostic test results will improve Outcome: Adequate for Discharge Goal: Respiratory complications will improve Outcome: Adequate for Discharge Goal: Cardiovascular complication will be avoided Outcome: Adequate for Discharge   Problem: Activity: Goal: Risk for activity intolerance will decrease Outcome: Adequate for Discharge   Problem: Nutrition: Goal: Adequate nutrition will be maintained Outcome: Adequate for Discharge   Problem: Coping: Goal: Level of anxiety will decrease Outcome: Adequate for Discharge   Problem: Elimination: Goal: Will not experience complications related to bowel motility Outcome: Adequate for Discharge Goal: Will not experience complications related to urinary retention Outcome: Adequate for Discharge   Problem: Pain Managment: Goal: General experience of comfort will improve and/or be controlled Outcome: Adequate for Discharge   Problem: Safety: Goal: Ability to remain free from injury will improve Outcome: Adequate for Discharge   Problem: Skin Integrity: Goal: Risk for impaired skin integrity will decrease Outcome: Adequate for Discharge   Problem: Education: Goal: Knowledge of disease and its progression will improve Outcome: Adequate for Discharge Goal: Individualized Educational Video(s) Outcome: Adequate for Discharge   Problem: Fluid Volume: Goal:  Compliance with measures to maintain balanced fluid volume will improve Outcome: Adequate for Discharge   Problem: Health Behavior/Discharge Planning: Goal: Ability to manage health-related needs will improve Outcome: Adequate for Discharge   Problem: Nutritional: Goal: Ability to make healthy dietary choices will improve Outcome: Adequate for Discharge   Problem: Clinical Measurements: Goal: Complications related to the disease process, condition or treatment will be avoided or minimized Outcome: Adequate for Discharge

## 2023-04-19 NOTE — Progress Notes (Addendum)
 Pt receives out-pt HD at [facility] on TTS. Pt arrives at 10:00 am for 10:15 am chair time. Clinic aware pt may be stable to resume care tomorrow. Will provide clinic updates as updates are received. Will assist as needed.   Olivia Canter Renal Navigator [phone]  Addendum at 1:30 pm: Advised of plans for pt to d/c today. Contacted [facility] to be advised that pt will d/c today and should resume care tomorrow. Will fax d/c summary to clinic once available for continuation of care.   Addendum at 3:29 pm: D/C summary faxed to clinic for continuation of care.

## 2023-04-19 NOTE — Progress Notes (Signed)
  Progress Note    04/19/2023 8:53 AM 1 Day Post-Op  Subjective:  Seen in HD unit. No complaints. Per RN left AV fistula working without issues. HR low   Vitals:   04/19/23 0813 04/19/23 0830  BP: (!) 177/76 (!) 155/70  Pulse: (!) 46 (!) 50  Resp: 15 18  Temp:    SpO2: 98% 97%   Physical Exam: Cardiac:  brady Lungs:  non labored Incisions:  right neck incision with small hematoma. Incision intact. Ecchymosis present Extremities:  left upper arm AV fistula with good thrill, some pulsatility. Working well on dialysis machine Neurologic: alert and oriented  CBC    Component Value Date/Time   WBC 5.4 04/19/2023 0352   RBC 3.09 (L) 04/19/2023 0352   HGB 9.7 (L) 04/19/2023 0352   HCT 30.5 (L) 04/19/2023 0352   PLT 53 (L) 04/19/2023 0352   MCV 98.7 04/19/2023 0352   MCH 31.4 04/19/2023 0352   MCHC 31.8 04/19/2023 0352   RDW 16.7 (H) 04/19/2023 0352   LYMPHSABS 1.0 04/17/2023 1208   MONOABS 0.4 04/17/2023 1208   EOSABS 0.0 04/17/2023 1208   BASOSABS 0.0 04/17/2023 1208    BMET    Component Value Date/Time   NA 136 04/19/2023 0352   K 4.6 04/19/2023 0352   CL 102 04/19/2023 0352   CO2 21 (L) 04/19/2023 0352   GLUCOSE 113 (H) 04/19/2023 0352   BUN 53 (H) 04/19/2023 0352   CREATININE 8.36 (H) 04/19/2023 0352   CALCIUM 8.0 (L) 04/19/2023 0352   CALCIUM 8.9 04/04/2020 1528   GFRNONAA 6 (L) 04/19/2023 0352   GFRAA (L) 07/30/2009 0530    41        The eGFR has been calculated using the MDRD equation. This calculation has not been validated in all clinical situations. eGFR's persistently <60 mL/min signify possible Chronic Kidney Disease.    INR    Component Value Date/Time   INR 1.5 (H) 04/18/2023 0600     Intake/Output Summary (Last 24 hours) at 04/19/2023 0853 Last data filed at 04/19/2023 0600 Gross per 24 hour  Intake 1130 ml  Output 0 ml  Net 1130 ml     Assessment/Plan:  83 y.o. male is s/p 1) removal of tunneled dialysis catheter 2) direct  repair of right jugular vein 1 Day Post-Op   Right neck incision is intact. There is small hematoma present and ecchymosis of right face, right neck and upper chest.  Pt denies any trouble swallowing Dressings on right chest will have RN change later today Left AV fistula has pulsatility but good thrill. Functioning well during dialysis when pt was seen today If any issues with fistula patient will require left internal jugular TDC and new access.   Graceann Congress, PA-C [facility] [phone] 04/19/2023 8:53 AM

## 2023-04-19 NOTE — Procedures (Signed)
 Received patient in bed to unit.  Alert and oriented.  Informed consent signed and in chart.   TX duration: 3.5 hours  Patient tolerated well.  Transported back to the room  Alert, without acute distress.  Hand-off given to patient's nurse.   Access used: left fistula Access issues: none  Total UF removed: 2 liters Medication(s) given: none   Lu Duffel, RN [facility]

## 2023-04-19 NOTE — Consult Note (Signed)
 [facility] Renal Consultation Note    Indication for Consultation:  Management of ESRD/hemodialysis; anemia, hypertension/volume and secondary hyperparathyroidism  [id number], Philbert Riser, MD  HPI: NATASHA PAULSON is a 83 y.o. male with ESRD on HD (appears to be TTS per notes) at [facility]. He has a past medical history Afib, GERD, HTN, gout, and stroke.   Patient presented to the ED 2nd malfunctioned R TDC. Patient has a L AVF which wasn't used yet. Appears he went to IR 3/14 for an exchange but unfortunately the procedure was unsuccessful. Ongoing bleeding was noted at [facility] site; thus, [facility] was then consulted. S/p R TDC removal and direct repair of right jugular vein by Dr. Lenell Antu 3/16.  Seen and examined patient on HD this morning. L AVF seems to be working well without any issues. Informed by HD RN of patient being in Afib with HR ranging 40-50s. Patient appears comfortable and remains on RA. BFR set at 300 which is appropriate. He denies SOB and CP. When asked about palpitations, He states: I do not know". BP is 175/75 and tolerating UFG 2L. EKG ordered. Discussed with Hospitalist who informed me of history of Afib and on Eliquis. No further changes were made to patient's dialysis setting and appears to be tolerating treatment. Per Hospitalist, may be discharged later today or tomorrow.  Past Medical History:  Diagnosis Date   Arthritis    gout   Asthma    has rescue inhaler   Cancer (HCC)    prostate   Chronic kidney disease    GERD (gastroesophageal reflux disease)    H/O peptic ulcer 1970's   Herpes 04/28/2020   Hyperlipidemia    Hypertension    Ureteral stone    Wears dentures    Wears glasses    Past Surgical History:  Procedure Laterality Date   A/V FISTULAGRAM Left 10/09/2022   Procedure: A/V Fistulagram;  Surgeon: Leonie Douglas, MD;  Location: [facility];  Service: Cardiovascular;  Laterality: Left;   A/V FISTULAGRAM N/A 02/26/2023   Procedure: A/V  Fistulagram;  Surgeon: Renford Dills, MD;  Location: [facility] INVASIVE CV LAB;  Service: Cardiovascular;  Laterality: N/A;   AV FISTULA PLACEMENT Left 02/15/2019   Procedure: ARTERIOVENOUS (AV) FISTULA CREATION LEFT ARM;  Surgeon: Larina Earthly, MD;  Location: [facility] OR;  Service: Vascular;  Laterality: Left;   CATARACT EXTRACTION W/ INTRAOCULAR LENS  IMPLANT, BILATERAL     CYSTOSCOPY/RETROGRADE/URETEROSCOPY/STONE EXTRACTION WITH BASKET Right 05/23/2012   Procedure: CYSTOSCOPY/RETROGRADE/URETEROSCOPY/STONE EXTRACTION WITH BASKET;  Surgeon: Valetta Fuller, MD;  Location: [facility];  Service: Urology;  Laterality: Right;   DIALYSIS/PERMA CATHETER INSERTION N/A 02/26/2023   Procedure: DIALYSIS/PERMA CATHETER INSERTION;  Surgeon: Renford Dills, MD;  Location: [facility] INVASIVE CV LAB;  Service: Cardiovascular;  Laterality: N/A;   FRACTURE SURGERY Right    as a child   HERNIA REPAIR     HOLMIUM LASER APPLICATION Right 05/23/2012   Procedure: HOLMIUM LASER APPLICATION;  Surgeon: Valetta Fuller, MD;  Location: [facility];  Service: Urology;  Laterality: Right;   INSERTION OF DIALYSIS CATHETER Right 04/08/2020   Procedure: INSERTION OF TUNNELED DIALYSIS CATHETER;  Surgeon: Lucretia Roers, MD;  Location: [facility];  Service: General;  Laterality: Right;   IR FLUORO GUIDE CV LINE RIGHT  04/16/2023   LIGATION OF ARTERIOVENOUS  FISTULA Left 09/12/2019   Procedure: SIDE BRANCH LIGATION  OF LEFT ARM ARTERIOVENOUS FISTULA;  Surgeon: Sherren Kerns, MD;  Location: [facility];  Service: Vascular;  Laterality: Left;   MULTIPLE TOOTH EXTRACTIONS     PERIPHERAL VASCULAR BALLOON ANGIOPLASTY  05/22/2020   Procedure: PERIPHERAL VASCULAR BALLOON ANGIOPLASTY;  Surgeon: Leonie Douglas, MD;  Location: [facility];  Service: Cardiovascular;;  Left arm fistula   PERIPHERAL VASCULAR BALLOON ANGIOPLASTY Left 10/09/2022   Procedure: PERIPHERAL VASCULAR BALLOON ANGIOPLASTY;  Surgeon: Leonie Douglas, MD;  Location: [facility];  Service: Cardiovascular;  Laterality: Left;   REMOVAL OF A DIALYSIS CATHETER Right 04/18/2023   Procedure: REMOVAL, DIALYSIS CATHETER and Repair of Right Jugular;  Surgeon: Leonie Douglas, MD;  Location: [facility] OR;  Service: Vascular;  Laterality: Right;   REVISON OF ARTERIOVENOUS FISTULA Left 09/12/2019   Procedure: REVISON OF LEFT ARM ARTERIOVENOUS FISTULA;  Surgeon: Sherren Kerns, MD;  Location: [facility] OR;  Service: Vascular;  Laterality: Left;   ROBOT ASSISTED LAPAROSCOPIC RADICAL PROSTATECTOMY  2011   TONSILLECTOMY     History reviewed. No pertinent family history. Social History:  reports that he quit smoking about 40 years ago. His smoking use included cigarettes. He has never used smokeless tobacco. He reports that he does not currently use alcohol after a past usage of about 1.0 standard drink of alcohol per week. He reports that he does not use drugs. Allergies  Allergen Reactions   Aspirin Other (See Comments)    GI irritation    Hydrochlorothiazide     Dizziness and unable to walk   Thiazide-Type Diuretics Other (See Comments)    Dizziness and unable to walk   Ibuprofen Other (See Comments)    GI irritation    Prior to Admission medications   Medication Sig Start Date End Date Taking? Authorizing Provider  acetaminophen (TYLENOL) 500 MG tablet Take 500 mg by mouth every 6 (six) hours as needed for moderate pain.   Yes [provider]  albuterol (PROVENTIL HFA;VENTOLIN HFA) 108 (90 BASE) MCG/ACT inhaler Inhale 2 puffs into the lungs every 6 (six) hours as needed (Asthma).   Yes [provider]  allopurinol (ZYLOPRIM) 100 MG tablet Take 100 mg by mouth at bedtime. 12/04/19  Yes [provider]  apixaban (ELIQUIS) 5 MG TABS tablet Take 2.5 mg by mouth See admin instructions. Take 2.5 mg by mouth on Tues., Thurs.,and Sat, in the evening @2000  and 2.5 mg rest of days during the morning only @0930  per spouse   Yes  [provider]  B Complex-C-Folic Acid (TRIPHROCAPS) 1 MG CAPS Take 1 capsule by mouth daily.   Yes [provider]  calcitRIOL (ROCALTROL) 0.25 MCG capsule Take 0.25 mcg by mouth Every Tuesday,Thursday,and Saturday with dialysis.   Yes [provider]  carboxymethylcellulose 1 % ophthalmic solution Place 1 drop into both eyes daily as needed (Dry eyes).   Yes [provider]  carvedilol (COREG) 3.125 MG tablet Take 1 tablet (3.125 mg total) by mouth See admin instructions. Take 1 tablet in the evenings on Dialysis days (Tues, Thurs, and Sat), On Non-dialysis days take 1 tablet twice a day. Patient taking differently: Take 3.125 mg by mouth every evening. 04/29/20  Yes Emokpae, Courage, MD  cefdinir (OMNICEF) 300 MG capsule Take 300 mg by mouth 2 (two) times daily. 04/13/23  Yes [provider]  furosemide (LASIX) 40 MG tablet Take 120 mg by mouth daily. 07/17/21  Yes [provider]  multivitamin (RENA-VIT) TABS tablet Take 1 tablet by mouth at bedtime. 07/25/21  Yes [provider]  rosuvastatin (CRESTOR) 5 MG tablet Take 5 mg by mouth at bedtime.   Yes [provider]  sertraline (ZOLOFT) 25 MG tablet Take 25 mg by mouth daily. 07/29/21  Yes [provider]  sevelamer carbonate (RENVELA) 2.4 g PACK Take 2.4 g by mouth 3 (three) times daily with meals.   Yes [provider]  traZODone (DESYREL) 50 MG tablet Take 25 mg by mouth at bedtime.   Yes [provider]  vitamin B-12 (CYANOCOBALAMIN) 1000 MCG tablet Take 1,000 mcg by mouth daily.   Yes [provider]  apixaban (ELIQUIS) 2.5 MG TABS tablet Take 1 tablet (2.5 mg total) by mouth See admin instructions. Take 2.5 mg Tues.Ronda Fairly Sat, in the evening Take 2.5 mg on Monday, Wednesday, Friday and Sunday twice a day Patient not taking: Reported on 04/18/2023 02/27/23   Marcelino Duster, MD   Current Facility-Administered Medications   Medication Dose Route Frequency Provider Last Rate Last Admin   acetaminophen (TYLENOL) tablet 650 mg  650 mg Oral Q6H PRN Shahmehdi, Gemma Payor, MD       allopurinol (ZYLOPRIM) tablet 100 mg  100 mg Oral QHS Lonia Blood, MD   100 mg at 04/18/23 2208   alteplase (CATHFLO ACTIVASE) injection 2 mg  2 mg Intracatheter Once PRN Oretha Milch, PA-C       anticoagulant sodium citrate solution 5 mL  5 mL Intracatheter PRN Collins, Samantha G, PA-C       apixaban (ELIQUIS) tablet 2.5 mg  2.5 mg Oral BID Shahmehdi, Seyed A, MD       bisacodyl (DULCOLAX) EC tablet 5 mg  5 mg Oral Daily PRN Kendell Bane, MD       [START ON 04/20/2023] calcitRIOL (ROCALTROL) capsule 0.25 mcg  0.25 mcg Oral Q T,Th,Sa-HD Shahmehdi, Seyed A, MD       cefUROXime (CEFTIN) tablet 500 mg  500 mg Oral Once per day on Tuesday Thursday Saturday Earnie Larsson, RPH   500 mg at 04/17/23 1836   Chlorhexidine Gluconate Cloth 2 % PADS 6 each  6 each Topical Q0600 Oretha Milch, PA-C       furosemide (LASIX) tablet 120 mg  120 mg Oral Daily Shahmehdi, Seyed A, MD   120 mg at 04/18/23 1043   heparin injection 1,000 Units  1,000 Units Intracatheter PRN Collins, Samantha G, PA-C       ipratropium (ATROVENT) nebulizer solution 0.5 mg  0.5 mg Nebulization Q6H PRN Shahmehdi, Seyed A, MD       levalbuterol (XOPENEX) nebulizer solution 0.63 mg  0.63 mg Nebulization Q6H PRN Shahmehdi, Seyed A, MD       lidocaine (PF) (XYLOCAINE) 1 % injection 5 mL  5 mL Intradermal PRN Collins, Samantha G, PA-C       lidocaine-prilocaine (EMLA) cream 1 Application  1 Application Topical PRN Oretha Milch, PA-C       multivitamin (RENA-VIT) tablet 1 tablet  1 tablet Oral QHS Shahmehdi, Seyed A, MD   1 tablet at 04/18/23 2208   ondansetron (ZOFRAN) tablet 4 mg  4 mg Oral Q6H PRN Shahmehdi, Seyed A, MD       Or   ondansetron (ZOFRAN) injection 4 mg  4 mg Intravenous Q6H PRN Shahmehdi, Seyed A, MD       oxyCODONE (Oxy IR/ROXICODONE)  immediate release tablet 5 mg  5 mg Oral Q4H PRN Lonia Blood, MD       pentafluoroprop-tetrafluoroeth (GEBAUERS) aerosol 1 Application  1 Application Topical PRN Collins, Quenten Raven, PA-C       polyvinyl alcohol (LIQUIFILM TEARS) 1.4 % ophthalmic solution 1 drop  1 drop Both Eyes PRN Shahmehdi, Seyed A, MD       rosuvastatin (CRESTOR) tablet 5 mg  5 mg Oral QHS Shahmehdi, Seyed A, MD   5 mg at 04/18/23 2208   senna-docusate (Senokot-S) tablet 1 tablet  1 tablet Oral QHS PRN Nevin Bloodgood A, MD       sertraline (ZOLOFT) tablet 25 mg  25 mg Oral Daily Shahmehdi, Seyed A, MD   25 mg at 04/18/23 1043   sevelamer carbonate (RENVELA) powder PACK 2.4 g  2.4 g Oral TID WC Shahmehdi, Seyed A, MD   2.4 g at 04/18/23 1719   sodium chloride flush (NS) 0.9 % injection 3 mL  3 mL Intravenous Q12H Shahmehdi, Seyed A, MD   3 mL at 04/18/23 2209   sodium chloride flush (NS) 0.9 % injection 3-10 mL  3-10 mL Intravenous PRN Nevin Bloodgood A, MD       traZODone (DESYREL) tablet 25 mg  25 mg Oral QHS PRN Nevin Bloodgood A, MD   25 mg at 04/18/23 2208   Labs: Basic Metabolic Panel: Recent Labs  Lab 04/17/23 1208 04/17/23 1249 04/17/23 1324 04/18/23 0723 04/18/23 1036 04/19/23 0352  NA 139   < >  --  138 139 136  K 4.1   < >  --  4.4 4.5 4.6  CL 99   < >  --  103 104 102  CO2 25  --   --   --  22 21*  GLUCOSE 124*   < >  --  81 98 113*  BUN 44*   < >  --  44* 46* 53*  CREATININE 6.72*   < >  --  8.40* 7.60* 8.36*  CALCIUM 8.8*  --   --   --  8.2* 8.0*  PHOS  --   --  3.7  --   --   --    < > = values in this interval not displayed.   Liver Function Tests: Recent Labs  Lab 04/17/23 1208  AST 76*  ALT 75*  ALKPHOS 77  BILITOT 0.7  PROT 6.6  ALBUMIN 3.4*   No results for input(s): "LIPASE", "AMYLASE" in the last 168 hours. No results for input(s): "AMMONIA" in the last 168 hours. CBC: Recent Labs  Lab 04/17/23 1208 04/17/23 1249 04/17/23 2226 04/18/23 0600 04/18/23 0723  04/19/23 0352  WBC 5.4  --  6.8 5.2  --  5.4  NEUTROABS 4.0  --   --   --   --   --   HGB 12.0*   < > 11.4* 10.8* 11.9* 9.7*  HCT 38.0*   < > 35.4* 33.7* 35.0* 30.5*  MCV 101.1*  --  99.2 99.1  --  98.7  PLT 71*  --  65* 61*  --  53*   < > = values in this interval not displayed.   Cardiac Enzymes: No results for input(s): "CKTOTAL", "CKMB", "CKMBINDEX", "TROPONINI" in the last 168 hours. CBG: Recent Labs  Lab 04/17/23 2136 04/18/23 1408  GLUCAP 152* 143*   Iron Studies: No results for input(s): "IRON", "TIBC", "TRANSFERRIN", "FERRITIN" in the last 72 hours. Studies/Results: DG Chest Port 1 View Result Date: 04/17/2023 CLINICAL DATA:  bleeding around dialysis port EXAM: PORTABLE CHEST 1 VIEW COMPARISON:  None Available. FINDINGS: There is new homogeneous opacity overlying the  right lateral costophrenic angle region lung with subtle blunting, favoring pleural effusion with or without associated atelectasis versus consolidation. Correlate clinically. Bilateral lung fields are otherwise clear. Left lateral costophrenic angle is clear. No pneumothorax. Stable cardio-mediastinal silhouette. No acute osseous abnormalities. There are subacute/healing right posterolateral eighth and ninth rib fractures noted. The soft tissues are within normal limits. Vascular stent noted overlying the left axillary region. Right IJ hemodialysis catheter noted with its tip overlying the cavoatrial junction region. IMPRESSION: New homogeneous opacity overlying the right lateral costophrenic angle region lung with subtle blunting, favoring pleural effusion with or without associated atelectasis versus consolidation. Correlate clinically. Electronically Signed   By: Jules Schick M.D.   On: 04/17/2023 13:09    ROS: All others negative except those listed in HPI.   Physical Exam: Vitals:   04/19/23 1030 04/19/23 1100 04/19/23 1118 04/19/23 1128  BP: (!) 183/68 (!) 165/68 (!) 177/80 (!) 168/68  Pulse: (!) 42 (!)  55 (!) 57 60  Resp: 19 18 (!) 23 20  Temp:   (!) 97.3 F (36.3 C)   TempSrc:      SpO2: 97% 99% 99% 98%  Weight:    73.8 kg  Height:         General: Elderly male, on RA, NAD Head: NCAT sclera not icteric Lungs: Clear anteriorly. No wheeze, rales or rhonchi. Breathing is unlabored. Heart: RRR. No murmur, rubs or gallops.  Abdomen: soft, nontender, +BS Lower extremities: no edema bilateral lower extremities Neuro: Alert to himself. Able to answer simple questions. Moves all extremities spontaneously. Vascular Access: L AVF (+) B/T; Dressing R upper chest (TDC removed 3/16)   Dialysis Orders:  TTS - [facility] Obtain outpatient medical records if he remains inpatient   Last Labs: Hgb 9.7, K 4.6, Ca 8.0, P 3.7 (04/17/23), Alb 3.4 (04/17/23)  Assessment/Plan: Malfunctioned TDC/Ongoing bleeding - S/p R TDC removal and dirst repair right jugular vein by Dr. Lenell Antu 3/16.  Thrombocytopenia - chronic issue per notes.  ESRD - on HD TTS usually per notes. Missed HD 3/13 d/t above. On HD today. Next HD tomorrow if he remains inpatient to get him back on his routine schedule. Platelets 53. Hypertension/volume  - Not overloaded on exam. Bps high. Noted low-dose Carvedilol on home med list but HR ranging 40-50s here. Doesn't appear Carvedilol was resumed here given current HR. Monitor for now.  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 Monitor for now. Will start Fe/ESA if he remains inpatient Secondary Hyperparathyroidism -  Add phos is AM Afib - Eliquis resumed today.  Nutrition - Renal diet with fluid restriction Dispo - Discussed with Hospitalist. Patient may be discharged later today or tomorrow.  Salome Holmes, NP [facility] 04/19/2023, 11:39 AM

## 2023-04-20 LAB — HEPATITIS B SURFACE ANTIBODY, QUANTITATIVE: Hep B S AB Quant (Post): 102 m[IU]/mL

## 2023-04-26 ENCOUNTER — Ambulatory Visit (INDEPENDENT_AMBULATORY_CARE_PROVIDER_SITE_OTHER): Payer: Medicare Other

## 2023-04-26 ENCOUNTER — Encounter (INDEPENDENT_AMBULATORY_CARE_PROVIDER_SITE_OTHER): Payer: Self-pay | Admitting: Vascular Surgery

## 2023-04-26 ENCOUNTER — Ambulatory Visit (INDEPENDENT_AMBULATORY_CARE_PROVIDER_SITE_OTHER): Payer: Medicare Other | Admitting: Vascular Surgery

## 2023-04-26 VITALS — BP 156/64 | HR 54 | Resp 18 | Ht 71.0 in | Wt 162.0 lb

## 2023-04-26 DIAGNOSIS — I10 Essential (primary) hypertension: Secondary | ICD-10-CM | POA: Diagnosis not present

## 2023-04-26 DIAGNOSIS — N186 End stage renal disease: Secondary | ICD-10-CM | POA: Diagnosis not present

## 2023-04-26 DIAGNOSIS — Z8679 Personal history of other diseases of the circulatory system: Secondary | ICD-10-CM

## 2023-04-26 DIAGNOSIS — J4520 Mild intermittent asthma, uncomplicated: Secondary | ICD-10-CM

## 2023-04-26 DIAGNOSIS — T82898A Other specified complication of vascular prosthetic devices, implants and grafts, initial encounter: Secondary | ICD-10-CM

## 2023-04-26 NOTE — Progress Notes (Signed)
 MRN : [id number]  Taylor Willis is a 82 y.o. (08-24-1940) male who presents with chief complaint of check access.  History of Present Illness:   The patient returns to the office for followup of their dialysis access.   Interval change since his last visit in the office on March 22, 2023.  On 04/17/2023 attempts at exchanging his catheter were made which resulted in significant bleeding subsequently he was brought to the emergency room where vascular surgery removed his catheter and repaired the jugular vein with a pursestring suture of Prolene.  Since March 15 he has been using his fistula for dialysis.  The patient reports the function of the access has been stable. Patient denies difficulty with cannulation. The patient denies increased bleeding time after removing the needles. The patient denies hand pain or other symptoms consistent with steal phenomena.  No significant arm swelling.  The patient denies any complaints from the dialysis center or their nephrologist.  The patient denies redness or swelling at the access site. The patient denies fever or chills at home or while on dialysis.  No history of rest pain symptoms. No new ulcers or wounds of the lower extremities have occurred.  The patient denies amaurosis fugax or recent TIA symptoms. There are no recent neurological changes noted. There is no history of DVT, PE or superficial thrombophlebitis. No recent episodes of angina or shortness of breath documented.   Duplex ultrasound of the AV access shows a patent access.  The previously noted stenosis is significantly increased compared to last study.  Flow volume today is 1306 cc/min    Current Meds  Medication Sig   acetaminophen (TYLENOL) 500 MG tablet Take 500 mg by mouth every 6 (six) hours as needed for moderate pain.   albuterol (PROVENTIL HFA;VENTOLIN HFA) 108 (90 BASE) MCG/ACT inhaler Inhale 2 puffs into the lungs every 6 (six) hours as needed  (Asthma).   allopurinol (ZYLOPRIM) 100 MG tablet Take 100 mg by mouth at bedtime.   apixaban (ELIQUIS) 5 MG TABS tablet Take 2.5 mg by mouth See admin instructions. Take 2.5 mg by mouth on Tues., Thurs.,and Sat, in the evening @2000  and 2.5 mg rest of days during the morning only @0930  per spouse   B Complex-C-Folic Acid (TRIPHROCAPS) 1 MG CAPS Take 1 capsule by mouth daily.   calcitRIOL (ROCALTROL) 0.25 MCG capsule Take 0.25 mcg by mouth Every Tuesday,Thursday,and Saturday with dialysis.   carboxymethylcellulose 1 % ophthalmic solution Place 1 drop into both eyes daily as needed (Dry eyes).   carvedilol (COREG) 3.125 MG tablet Take 1 tablet (3.125 mg total) by mouth See admin instructions. Take 1 tablet in the evenings on Dialysis days (Tues, Thurs, and Sat), On Non-dialysis days take 1 tablet twice a day. (Patient taking differently: Take 3.125 mg by mouth every evening.)   cefdinir (OMNICEF) 300 MG capsule Take 300 mg by mouth 2 (two) times daily.   furosemide (LASIX) 40 MG tablet Take 120 mg by mouth daily.   multivitamin (RENA-VIT) TABS tablet Take 1 tablet by mouth at bedtime.   rosuvastatin (CRESTOR) 5 MG tablet Take 5 mg by mouth at bedtime.   sertraline (ZOLOFT) 25 MG tablet Take 25 mg by mouth daily.   sevelamer carbonate (RENVELA) 2.4 g PACK Take 2.4 g by mouth 3 (three) times daily with meals.   traZODone (DESYREL) 50 MG tablet Take 25  mg by mouth at bedtime.   vitamin B-12 (CYANOCOBALAMIN) 1000 MCG tablet Take 1,000 mcg by mouth daily.   Current Facility-Administered Medications for the 04/26/23 encounter (Office Visit) with Gilda Crease, Latina Craver, MD  Medication   0.9 %  sodium chloride infusion   sodium chloride flush (NS) 0.9 % injection 3 mL    Past Medical History:  Diagnosis Date   Arthritis    gout   Asthma    has rescue inhaler   Cancer (HCC)    prostate   Chronic kidney disease    GERD (gastroesophageal reflux disease)    H/O peptic ulcer 1970's   Herpes 04/28/2020    Hyperlipidemia    Hypertension    Ureteral stone    Wears dentures    Wears glasses     Past Surgical History:  Procedure Laterality Date   A/V FISTULAGRAM Left 10/09/2022   Procedure: A/V Fistulagram;  Surgeon: Leonie Douglas, MD;  Location: [facility];  Service: Cardiovascular;  Laterality: Left;   A/V FISTULAGRAM N/A 02/26/2023   Procedure: A/V Fistulagram;  Surgeon: Renford Dills, MD;  Location: [facility];  Service: Cardiovascular;  Laterality: N/A;   AV FISTULA PLACEMENT Left 02/15/2019   Procedure: ARTERIOVENOUS (AV) FISTULA CREATION LEFT ARM;  Surgeon: Larina Earthly, MD;  Location: [facility];  Service: Vascular;  Laterality: Left;   CATARACT EXTRACTION W/ INTRAOCULAR LENS  IMPLANT, BILATERAL     CYSTOSCOPY/RETROGRADE/URETEROSCOPY/STONE EXTRACTION WITH BASKET Right 05/23/2012   Procedure: CYSTOSCOPY/RETROGRADE/URETEROSCOPY/STONE EXTRACTION WITH BASKET;  Surgeon: Valetta Fuller, MD;  Location: [facility];  Service: Urology;  Laterality: Right;   DIALYSIS/PERMA CATHETER INSERTION N/A 02/26/2023   Procedure: DIALYSIS/PERMA CATHETER INSERTION;  Surgeon: Renford Dills, MD;  Location: [facility];  Service: Cardiovascular;  Laterality: N/A;   FRACTURE SURGERY Right    as a child   HERNIA REPAIR     HOLMIUM LASER APPLICATION Right 05/23/2012   Procedure: HOLMIUM LASER APPLICATION;  Surgeon: Valetta Fuller, MD;  Location: [facility];  Service: Urology;  Laterality: Right;   INSERTION OF DIALYSIS CATHETER Right 04/08/2020   Procedure: INSERTION OF TUNNELED DIALYSIS CATHETER;  Surgeon: Lucretia Roers, MD;  Location: [facility];  Service: General;  Laterality: Right;   IR FLUORO GUIDE CV LINE RIGHT  04/16/2023   LIGATION OF ARTERIOVENOUS  FISTULA Left 09/12/2019   Procedure: SIDE BRANCH LIGATION  OF LEFT ARM ARTERIOVENOUS FISTULA;  Surgeon: Sherren Kerns, MD;  Location[facility] OR;  Service: Vascular;  Laterality: Left;   MULTIPLE  TOOTH EXTRACTIONS     PERIPHERAL VASCULAR BALLOON ANGIOPLASTY  05/22/2020   Procedure: PERIPHERAL VASCULAR BALLOON ANGIOPLASTY;  Surgeon: Leonie Douglas, MD;  Location: [facility];  Service: Cardiovascular;;  Left arm fistula   PERIPHERAL VASCULAR BALLOON ANGIOPLASTY Left 10/09/2022   Procedure: PERIPHERAL VASCULAR BALLOON ANGIOPLASTY;  Surgeon: Leonie Douglas, MD;  Location: [facility];  Service: Cardiovascular;  Laterality: Left;   REMOVAL OF A DIALYSIS CATHETER Right 04/18/2023   Procedure: REMOVAL, DIALYSIS CATHETER and Repair of Right Jugular;  Surgeon: Leonie Douglas, MD;  Location: [facility];  Service: Vascular;  Laterality: Right;   REVISON OF ARTERIOVENOUS FISTULA Left 09/12/2019   Procedure: REVISON OF LEFT ARM ARTERIOVENOUS FISTULA;  Surgeon: Sherren Kerns, MD;  Location: [facility] OR;  Service: Vascular;  Laterality: Left;   ROBOT ASSISTED LAPAROSCOPIC RADICAL PROSTATECTOMY  2011   TONSILLECTOMY  Social History Social History   Tobacco Use   Smoking status: Former    Current packs/day: 0.00    Types: Cigarettes    Quit date: 05/18/1982    Years since quitting: 40.9   Smokeless tobacco: Never  Vaping Use   Vaping status: Never Used  Substance Use Topics   Alcohol use: Not Currently    Alcohol/week: 1.0 standard drink of alcohol    Types: 1 Standard drinks or equivalent per week   Drug use: Never    Family History History reviewed. No pertinent family history.  Allergies  Allergen Reactions   Aspirin Other (See Comments)    GI irritation    Hydrochlorothiazide     Dizziness and unable to walk   Thiazide-Type Diuretics Other (See Comments)    Dizziness and unable to walk   Ibuprofen Other (See Comments)    GI irritation      REVIEW OF SYSTEMS (Negative unless checked)  Constitutional: [] Weight loss  [] Fever  [] Chills Cardiac: [] Chest pain   [] Chest pressure   [] Palpitations   [] Shortness of breath when laying flat   [] Shortness of breath with  exertion. Vascular:  [] Pain in legs with walking   [] Pain in legs at rest  [] History of DVT   [] Phlebitis   [] Swelling in legs   [] Varicose veins   [] Non-healing ulcers Pulmonary:   [] Uses home oxygen   [] Productive cough   [] Hemoptysis   [] Wheeze  [] COPD   [] Asthma Neurologic:  [] Dizziness   [] Seizures   [] History of stroke   [] History of TIA  [] Aphasia   [] Vissual changes   [] Weakness or numbness in arm   [] Weakness or numbness in leg Musculoskeletal:   [] Joint swelling   [] Joint pain   [] Low back pain Hematologic:  [] Easy bruising  [] Easy bleeding   [] Hypercoagulable state   [] Anemic Gastrointestinal:  [] Diarrhea   [] Vomiting  [] Gastroesophageal reflux/heartburn   [] Difficulty swallowing. Genitourinary:  [x] Chronic kidney disease   [] Difficult urination  [] Frequent urination   [] Blood in urine Skin:  [] Rashes   [] Ulcers  Psychological:  [] History of anxiety   []  History of major depression.  Physical Examination  Vitals:   04/26/23 1155  BP: (!) 156/64  Pulse: (!) 54  Resp: 18  Weight: 162 lb (73.5 kg)  Height: 5\' 11"  (1.803 m)   Body mass index is 22.59 kg/m. Gen: WD/WN, NAD Head: [place]/AT, No temporalis wasting.  Ear/Nose/Throat: Hearing grossly intact, nares w/o erythema or drainage Eyes: PER, EOMI, sclera nonicteric.  Neck: Supple, no gross masses or lesions.  No JVD.  Pulmonary:  Good air movement, no audible wheezing, no use of accessory muscles.  Cardiac: RRR, precordium non-hyperdynamic. Vascular:   Left brachiocephalic fistula is markedly pulsatile.  The aneurysmal mid portion again demonstrates thinned the pigmented skin.  Previous ulceration has healed.  There is a significant amount of ecchymoses in the right neck and chest wall.  2 nylon mattress sutures are present at the exit site. Vessel Right Left  Radial Palpable Palpable  Brachial Palpable Palpable  Gastrointestinal: soft, non-distended. No guarding/no peritoneal signs.  Musculoskeletal: M/S 5/5 throughout.  No  deformity.  Neurologic: CN 2-12 intact. Pain and light touch intact in extremities.  Symmetrical.  Speech is fluent. Motor exam as listed above. Psychiatric: Judgment intact, Mood & affect appropriate for pt's clinical situation. Dermatologic: No rashes or ulcers noted.  No changes consistent with cellulitis.   CBC Lab Results  Component Value Date   WBC 5.4 04/19/2023   HGB  9.7 (L) 04/19/2023   HCT 30.5 (L) 04/19/2023   MCV 98.7 04/19/2023   PLT 53 (L) 04/19/2023    BMET    Component Value Date/Time   NA 136 04/19/2023 0352   K 4.6 04/19/2023 0352   CL 102 04/19/2023 0352   CO2 21 (L) 04/19/2023 0352   GLUCOSE 113 (H) 04/19/2023 0352   BUN 53 (H) 04/19/2023 0352   CREATININE 8.36 (H) 04/19/2023 0352   CALCIUM 8.0 (L) 04/19/2023 0352   CALCIUM 8.9 04/04/2020 1528   GFRNONAA 6 (L) 04/19/2023 0352   GFRAA (L) 07/30/2009 0530    41        The eGFR has been calculated using the MDRD equation. This calculation has not been validated in all clinical situations. eGFR's persistently <60 mL/min signify possible Chronic Kidney Disease.   Estimated Creatinine Clearance: 7.1 mL/min (A) (by C-G formula based on SCr of 8.36 mg/dL (H)).  COAG Lab Results  Component Value Date   INR 1.5 (H) 04/18/2023   INR 1.4 (H) 04/17/2023   INR 1.3 (H) 02/23/2023    Radiology DG Chest Port 1 View Result Date: 04/17/2023 CLINICAL DATA:  bleeding around dialysis port EXAM: PORTABLE CHEST 1 VIEW COMPARISON:  None Available. FINDINGS: There is new homogeneous opacity overlying the right lateral costophrenic angle region lung with subtle blunting, favoring pleural effusion with or without associated atelectasis versus consolidation. Correlate clinically. Bilateral lung fields are otherwise clear. Left lateral costophrenic angle is clear. No pneumothorax. Stable cardio-mediastinal silhouette. No acute osseous abnormalities. There are subacute/healing right posterolateral eighth and ninth rib  fractures noted. The soft tissues are within normal limits. Vascular stent noted overlying the left axillary region. Right IJ hemodialysis catheter noted with its tip overlying the cavoatrial junction region. IMPRESSION: New homogeneous opacity overlying the right lateral costophrenic angle region lung with subtle blunting, favoring pleural effusion with or without associated atelectasis versus consolidation. Correlate clinically. Electronically Signed   By: Jules Schick M.D.   On: 04/17/2023 13:09   IR Fluoro Guide CV Line Right Result Date: 04/16/2023 INDICATION: Malfunctioning tunneled dialysis catheter with broken arterial lumen. The patient presents for dialysis catheter exchange. EXAM: ATTEMPTED REMOVAL OF TUNNELED CENTRAL VENOUS CATHETER MEDICATIONS: None ANESTHESIA/SEDATION: None FLUOROSCOPY: Radiation Exposure Index (as provided by the fluoroscopic device): 36 seconds. 4.0 mGy. COMPLICATIONS: None immediate. PROCEDURE: Informed written consent was obtained from the patient after a thorough discussion of the procedural risks, benefits and alternatives. All questions were addressed. Maximal Sterile Barrier Technique was utilized including caps, mask, sterile gowns, sterile gloves, sterile drape, hand hygiene and skin antiseptic. A timeout was performed prior to the initiation of the procedure. The patient's right chest and catheter was prepped and draped in a normal sterile fashion. Heparin was removed from both ports of catheter. 1% lidocaine was used for local anesthesia. Attempt was made to remove the tunneled right-sided catheter placed via the right internal jugular vein by applying traction to the catheter, extending the catheter exit site by an incision, retracting the tissue and utilizing both sharp and blunt dissection. Ultimately, a Prolene retention suture was applied at the exit site and a dressing placed over the catheter exit site. FINDINGS: Initial fluoroscopy demonstrates a tunneled  split-type hemodialysis catheter via the right internal jugular vein with the distal catheter tip in the right atrium. The catheter could not be removed for exchange utilizing extensive blunt and sharp dissection as the subcutaneous cuff was located too deep to be located and may be at/near the  level of right jugular venous access. Attempted catheter removal was also quite painful for the patient utilizing only local anesthetic and the patient was not set up today for moderate IV conscious sedation or recovery. Plans were made for the patient to be rescheduled on 04/19/2023 for further attempted catheter removal which may require a separate cutdown incision and placement of a new dialysis catheter either via catheter exchange or new puncture of either the right or left internal jugular vein. IMPRESSION: 1. Inability to remove the pre-existing malfunctioning tunneled right jugular hemodialysis catheter to allow catheter exchange today over a guidewire. The subcutaneous cuff was located in too deep of a position to be located and may be at the level of right jugular venous access. 2. Plans were made for the patient to be rescheduled on 04/19/2023 for further attempted catheter removal under moderate IV conscious sedation which may require a separate cutdown incision and placement of a new dialysis catheter either via catheter exchange or new puncture of either the right or left internal jugular vein. Electronically Signed   By: Irish Lack M.D.   On: 04/16/2023 16:00     Assessment/Plan 1. ESRD (end stage renal disease) (HCC) (Primary) Recommend:  Although patient is not experiencing increasing problems with their dialysis access the access is markedly pulsatile and the duplex ultrasound demonstrates marked elevation of the velocities at the shoulder which correlates with the distal aspect of the previously placed stent.  Given that the patient has had significant bleeding episodes from his fistula  secondary to a more proximal stenosis I believe angiography with the intention to treat is warranted to prevent yet another bleeding episode.  Patient should have a fistulagram with the intention for intervention.  The intention for intervention is to restore appropriate flow and prevent thrombosis and possible loss of the access.  As well as improve the quality of dialysis therapy.  The risks, benefits and alternative therapies were reviewed in detail with the patient.  All questions were answered.  The patient agrees to proceed with angio/intervention.  The patient and family have requested this be done at [facility] which I do not believe is possible.  They then requested could it be done at [facility].  Since Dr. Lenell Antu has recently taken care of Mr. Probert I will contact him to see if he would be willing to perform the fistulogram and intervention.  The patient will follow up with me in the office after the procedure.   2. Benign essential hypertension Continue antihypertensive medications as already ordered, these medications have been reviewed and there are no changes at this time.  3. Mild intermittent asthma, uncomplicated Continue pulmonary medications and aerosols as already ordered, these medications have been reviewed and there are no changes at this time.   4. History of atrial fibrillation Continue antiarrhythmia medications as already ordered, these medications have been reviewed and there are no changes at this time.  Continue anticoagulation as ordered by Cardiology Service    Levora Dredge, MD  04/26/2023 8:51 PM

## 2023-04-27 ENCOUNTER — Other Ambulatory Visit: Payer: Self-pay

## 2023-04-27 DIAGNOSIS — N186 End stage renal disease: Secondary | ICD-10-CM

## 2023-04-27 MED ORDER — SODIUM CHLORIDE 0.9% FLUSH: 3.0000 mL | INTRAVENOUS | Status: DC | PRN

## 2023-05-04 NOTE — Addendum Note (Signed)
 Encounter addended by: Edward Qualia on: 05/04/2023 2:16 PM  Actions taken: Imaging Exam ended

## 2023-05-21 ENCOUNTER — Other Ambulatory Visit: Payer: Self-pay

## 2023-05-21 ENCOUNTER — Ambulatory Visit (HOSPITAL_COMMUNITY)
Admission: RE | Admit: 2023-05-21 | Discharge: 2023-05-21 | Disposition: A | Discharge status: Home / Self Care | Attending: Vascular Surgery | Admitting: Vascular Surgery

## 2023-05-21 ENCOUNTER — Encounter (HOSPITAL_COMMUNITY)
Admission: RE | Disposition: A | Discharge status: Home / Self Care | Payer: Self-pay | Source: Home / Self Care | Attending: Vascular Surgery

## 2023-05-21 DIAGNOSIS — T82858A Stenosis of vascular prosthetic devices, implants and grafts, initial encounter: Secondary | ICD-10-CM | POA: Diagnosis present

## 2023-05-21 DIAGNOSIS — Y832 Surgical operation with anastomosis, bypass or graft as the cause of abnormal reaction of the patient, or of later complication, without mention of misadventure at the time of the procedure: Secondary | ICD-10-CM | POA: Insufficient documentation

## 2023-05-21 DIAGNOSIS — Z992 Dependence on renal dialysis: Secondary | ICD-10-CM | POA: Insufficient documentation

## 2023-05-21 DIAGNOSIS — I120 Hypertensive chronic kidney disease with stage 5 chronic kidney disease or end stage renal disease: Secondary | ICD-10-CM | POA: Diagnosis not present

## 2023-05-21 DIAGNOSIS — N186 End stage renal disease: Secondary | ICD-10-CM | POA: Insufficient documentation

## 2023-05-21 DIAGNOSIS — Z87891 Personal history of nicotine dependence: Secondary | ICD-10-CM | POA: Diagnosis not present

## 2023-05-21 DIAGNOSIS — T82898A Other specified complication of vascular prosthetic devices, implants and grafts, initial encounter: Secondary | ICD-10-CM

## 2023-05-21 DIAGNOSIS — T82510A Breakdown (mechanical) of surgically created arteriovenous fistula, initial encounter: Secondary | ICD-10-CM | POA: Diagnosis not present

## 2023-05-21 HISTORY — PX: A/V FISTULAGRAM: CATH118298

## 2023-05-21 LAB — POCT I-STAT, CHEM 8
BUN: 16 mg/dL (ref 8–23)
Calcium, Ion: 1.06 mmol/L — ABNORMAL LOW (ref 1.15–1.40)
Chloride: 98 mmol/L (ref 98–111)
Creatinine, Ser: 3.5 mg/dL — ABNORMAL HIGH (ref 0.61–1.24)
Glucose, Bld: 78 mg/dL (ref 70–99)
HCT: 38 % — ABNORMAL LOW (ref 39.0–52.0)
Hemoglobin: 12.9 g/dL — ABNORMAL LOW (ref 13.0–17.0)
Potassium: 3.4 mmol/L — ABNORMAL LOW (ref 3.5–5.1)
Sodium: 140 mmol/L (ref 135–145)
TCO2: 33 mmol/L — ABNORMAL HIGH (ref 22–32)

## 2023-05-21 SURGERY — A/V FISTULAGRAM
Anesthesia: LOCAL

## 2023-05-21 MED ORDER — HEPARIN (PORCINE) IN NACL 1000-0.9 UT/500ML-% IV SOLN
INTRAVENOUS | Status: DC | PRN
  Administered 2023-05-21: 500 mL

## 2023-05-21 MED ORDER — LIDOCAINE HCL (PF) 1 % IJ SOLN
INTRAMUSCULAR | Status: AC
  Filled 2023-05-21: qty 30

## 2023-05-21 MED ORDER — LIDOCAINE HCL (PF) 1 % IJ SOLN
INTRAMUSCULAR | Status: DC | PRN
  Administered 2023-05-21: 2 mL

## 2023-05-21 MED ORDER — IODIXANOL 320 MG/ML IV SOLN
INTRAVENOUS | Status: DC | PRN
  Administered 2023-05-21: 45 mL

## 2023-05-21 SURGICAL SUPPLY — 11 items
BALLN MUSTANG 7.0X40 75 (BALLOONS) ×1 IMPLANT
BALLOON MUSTANG 7.0X40 75 (BALLOONS) IMPLANT
CATH ANGIO 5F BER2 65CM (CATHETERS) IMPLANT
COVER DOME SNAP 22 D (MISCELLANEOUS) ×1 IMPLANT
KIT MICROPUNCTURE NIT STIFF (SHEATH) IMPLANT
SHEATH PINNACLE R/O II 6F 4CM (SHEATH) IMPLANT
SHEATH PROBE COVER 6X72 (BAG) ×1 IMPLANT
STOPCOCK MORSE 400PSI 3WAY (MISCELLANEOUS) ×1 IMPLANT
TRAY PV CATH (CUSTOM PROCEDURE TRAY) ×1 IMPLANT
TUBING CIL FLEX 10 FLL-RA (TUBING) ×1 IMPLANT
WIRE BENTSON .035X145CM (WIRE) IMPLANT

## 2023-05-21 NOTE — H&P (Signed)
 [facility]ISTS OF [place]  ASSESSMENT / PLAN: 83 y.o. male with possible in-stent stenosis in left arm fistula. Plan fistulagram in cath lab.  CHIEF COMPLAINT: malfunctioning fistula  HISTORY OF PRESENT ILLNESS: Taylor Willis is a 83 y.o. 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  Duplex performed at [facility] showed possible stent stenosis. Fistulagram was offered.  Past Medical History:  Diagnosis Date   Arthritis    gout   Asthma    has rescue inhaler   Cancer (HCC)    prostate   Chronic kidney disease    GERD (gastroesophageal reflux disease)    H/O peptic ulcer 1970's   Herpes 04/28/2020   Hyperlipidemia    Hypertension    Ureteral stone    Wears dentures    Wears glasses     Past Surgical History:  Procedure Laterality Date   A/V FISTULAGRAM Left 10/09/2022   Procedure: A/V Fistulagram;  Surgeon: Carlene Che, MD;  Location: [facility];  Service: Cardiovascular;  Laterality: Left;   A/V FISTULAGRAM N/A 02/26/2023   Procedure: A/V Fistulagram;  Surgeon: Jackquelyn Mass, MD;  Location: [facility] INV[facility];  Service: Cardiovascular;  Laterality: N/A;   AV FISTULA PLACEMENT Left 02/15/2019   Procedure: ARTERIOVENOUS (AV) FISTULA CREATION LEFT ARM;  Surgeon: Mayo Speck, MD;  Location: [facility] OR;  Service: Vascular;  Laterality: Left;   CATARACT EXTRACTION W/ INTRAOCULAR LENS  IMPLANT, BILATERAL     CYSTOSCOPY/RETROGRADE/URETEROSCOPY/STONE EXTRACTION WITH BASKET Right 05/23/2012   Procedure: CYSTOSCOPY/RETROGRADE/URETEROSCOPY/STONE EXTRACTION WITH BASKET;  Surgeon: Livingston Rigg, MD;  Location: [facility];  Service: Urology;  Laterality: Right;   DIALYSIS/PERMA CATHETER INSERTION N/A 02/26/2023   Procedure: DIALYSIS/PERMA CATHETER INSERTION;  Surgeon: Jackquelyn Mass, MD;  Location: [facility] INV[facility];  Service: Cardiovascular;  Laterality: N/A;   FRACTURE SURGERY Right     as a child   HERNIA REPAIR     HOLMIUM LASER APPLICATION Right 05/23/2012   Procedure: HOLMIUM LASER APPLICATION;  Surgeon: Livingston Rigg, MD;  Location: [facility];  Service: Urology;  Laterality: Right;   INSERTION OF DIALYSIS CATHETER Right 04/08/2020   Procedure: INSERTION OF TUNNELED DIALYSIS CATHETER;  Surgeon: Awilda Bogus, MD;  Location: [facility];  Service: General;  Laterality: Right;   IR FLUORO GUIDE CV LINE RIGHT  04/16/2023   LIGATION OF ARTERIOVENOUS  FISTULA Left 09/12/2019   Procedure: SIDE BRANCH LIGATION  OF LEFT ARM ARTERIOVENOUS FISTULA;  Surgeon: Richrd Char, MD;  Location: [facility] OR;  Service: Vascular;  Laterality: Left;   MULTIPLE TOOTH EXTRACTIONS     PERIPHERAL VASCULAR BALLOON ANGIOPLASTY  05/22/2020   Procedure: PERIPHERAL VASCULAR BALLOON ANGIOPLASTY;  Surgeon: Carlene Che, MD;  Location: [facility];  Service: Cardiovascular;;  Left arm fistula   PERIPHERAL VASCULAR BALLOON ANGIOPLASTY Left 10/09/2022   Procedure: PERIPHERAL VASCULAR BALLOON ANGIOPLASTY;  Surgeon: Carlene Che, MD;  Location: [facility];  Service: Cardiovascular;  Laterality: Left;   REMOVAL OF A DIALYSIS CATHETER Right 04/18/2023   Procedure: REMOVAL, DIALYSIS CATHETER and Repair of Right Jugular;  Surgeon: Carlene Che, MD;  Location: [facility] OR;  Service: Vascular;  Laterality: Right;   REVISON OF ARTERIOVENOUS FISTULA Left 09/12/2019   Procedure: REVISON OF LEFT ARM ARTERIOVENOUS FISTULA;  Surgeon: Richrd Char, MD;  Location: [facility];  Service: Vascular;  Laterality: Left;   ROBOT ASSISTED LAPAROSCOPIC RADICAL PROSTATECTOMY  2011   TONSILLECTOMY      No family history on file.  Social History   Socioeconomic History   Marital status: Married    Spouse name: Not on file   Number of children: Not on file   Years of education: Not on file   Highest education level: Not on file  Occupational History   Not on file  Tobacco Use   Smoking status:  Former    Current packs/day: 0.00    Types: Cigarettes    Quit date: 05/18/1982    Years since quitting: 41.0   Smokeless tobacco: Never  Vaping Use   Vaping status: Never Used  Substance and Sexual Activity   Alcohol  use: Not Currently    Alcohol /week: 1.0 standard drink of alcohol     Types: 1 Standard drinks or equivalent per week   Drug use: Never   Sexual activity: Not Currently  Other Topics Concern   Not on file  Social History Narrative   Not on file   Social Drivers of Health   Financial Resource Strain: Low Risk  (02/27/2022)   Received from [facility]   Overall Financial Resource Strain (CARDIA)    Difficulty of Paying Living Expenses: Not hard at all  Food Insecurity: No Food Insecurity (04/17/2023)   Hunger Vital Sign    Worried About Running Out of Food in the Last Year: Never true    Ran Out of Food in the Last Year: Never true  Transportation Needs: No Transportation Needs (04/17/2023)   PRAPARE - Administrator, Civil Service (Medical): No    Lack of Transportation (Non-Medical): No  Physical Activity: Inactive (02/27/2022)   [facility] from [facility]   Exercise Vital Sign    Days of Exercise per Week: 0 days    Minutes of Exercise per Session: 0 min  Stress: No Stress Concern Present (02/27/2022)   Received from [facility], [facility] of Occupational Health - Occupational Stress Questionnaire    Feeling of Stress : Not at all  Social Connections: Socially Integrated (04/17/2023)   Social Connection and Isolation Panel [NHANES]    Frequency of Communication with Friends and Family: More than three times a week    Frequency of Social Gatherings with Friends and Family: Three times a week    Attends Religious Services: More than 4 times per year    Active Member of Clubs or Organizations: Yes    Attends Banker Meetings: More than 4 times per year    Marital Status:  Married  Catering manager Violence: Not At Risk (04/17/2023)   Humiliation, Afraid, Rape, and Kick questionnaire    Fear of Current or Ex-Partner: No    Emotionally Abused: No    Physically Abused: No    Sexually Abused: No    Allergies  Allergen Reactions   Aspirin Other (See Comments)    GI irritation    Hydrochlorothiazide     Dizziness and unable to walk   Thiazide-Type Diuretics Other (See Comments)    Dizziness and unable to walk   Ibuprofen Other (See Comments)    GI irritation     Current Facility-Administered Medications  Medication Dose Route Frequency Provider Last Rate Last Admin   Heparin  (Porcine) in NaCl 1000-0.9 UT/500ML-% SOLN    PRN Carlene Che, MD   500 mL at 05/21/23 0744   iodixanol  (VISIPAQUE ) 320 MG/ML injection  PRN Carlene Che, MD   45 mL at 05/21/23 1610   lidocaine  (PF) (XYLOCAINE ) 1 % injection    PRN Carlene Che, MD   2 mL at 05/21/23 0803    PHYSICAL EXAM Vitals:   05/21/23 0614 05/21/23 0753  BP: (!) 183/69   Pulse: (!) 56   Resp: (!) 23   Temp: 98.4 F (36.9 C)   TempSrc: Oral   SpO2: 93% 96%  Weight: 73.5 kg   Height: 5\' 10"  (1.778 m)    Elderly man in no distress Left arm brachiocephalic arteriovenous fistula with strong pulsatility suggesting outflow stenosis  PERTINENT LABORATORY AND RADIOLOGIC DATA  Most recent CBC    Latest Ref Rng & Units 05/21/2023    6:12 AM 04/19/2023    3:52 AM 04/18/2023    7:23 AM  CBC  WBC 4.0 - 10.5 K/uL  5.4    Hemoglobin 13.0 - 17.0 g/dL 96.0  9.7  45.4   Hematocrit 39.0 - 52.0 % 38.0  30.5  35.0   Platelets 150 - 400 K/uL  53       Most recent CMP    Latest Ref Rng & Units 05/21/2023    6:12 AM 04/19/2023    3:52 AM 04/18/2023   10:36 AM  CMP  Glucose 70 - 99 mg/dL 78  098  98   BUN 8 - 23 mg/dL 16  53  46   Creatinine 0.61 - 1.24 mg/dL 1.19  1.47  8.29   Sodium 135 - 145 mmol/L 140  136  139   Potassium 3.5 - 5.1 mmol/L 3.4  4.6  4.5   Chloride 98 - 111 mmol/L 98  102   104   CO2 22 - 32 mmol/L  21  22   Calcium  8.9 - 10.3 mg/dL  8.0  8.2     Renal function Estimated Creatinine Clearance: 16.8 mL/min (A) (by C-G formula based on SCr of 3.5 mg/dL (H)).  Hgb A1c MFr Bld (%)  Date Value  07/06/2020 5.4    LDL Cholesterol  Date Value Ref Range Status  07/06/2020 59 0 - 99 mg/dL Final    Comment:           Total Cholesterol/HDL:CHD Risk Coronary Heart Disease Risk Table                     Men   Women  1/2 Average Risk   3.4   3.3  Average Risk       5.0   4.4  2 X Average Risk   9.6   7.1  3 X Average Risk  23.4   11.0        Use the calculated Patient Ratio above and the CHD Risk Table to determine the patient's CHD Risk.        ATP III CLASSIFICATION (LDL):  <100     mg/dL   Optimal  562-130  mg/dL   Near or Above                    Optimal  130-159  mg/dL   Borderline  865-784  mg/dL   High  >696     mg/dL   Very High Performed at [facility], [address] [place], [place] [address]     Heber Little. Edgardo Goodwill, MD [facility] of [facility] Phone Number: [phone] 05/21/2023 8:37 AM   Total time spent  on preparing this encounter including chart review, data review, collecting history, examining the patient, coordinating care for this established patient, 30 minutes.  Portions of this report may have been transcribed using voice recognition software.  Every effort has been made to ensure accuracy; however, inadvertent computerized transcription errors may still be present.

## 2023-05-21 NOTE — Op Note (Signed)
 DATE OF SERVICE: 05/21/2023  PATIENT:  Taylor Willis  83 y.o. male  PRE-OPERATIVE DIAGNOSIS:  ESRD  POST-OPERATIVE DIAGNOSIS:  Same  PROCEDURE:   1) ultrasound guided left arm arteriovenous fistula access 2) left arm fistulagram 3) left AVF angioplasty (7x42mm Mustang)  SURGEON:  Surgeons and Role:    * Magda Debby SAILOR, MD - Primary  ASSISTANT: none  ANESTHESIA:   local  EBL: minimal  BLOOD ADMINISTERED:none  DRAINS: none   LOCAL MEDICATIONS USED:  LIDOCAINE    SPECIMEN:  none  COUNTS: confirmed correct.  TOURNIQUET:  none  PATIENT DISPOSITION:  PACU - hemodynamically stable.   Delay start of Pharmacological VTE agent (>24hrs) due to surgical blood loss or risk of bleeding: no  INDICATION FOR PROCEDURE: REYNOLDS KITTEL is a 83 y.o. male with ESRD dialyzing through left arm AVF. Duplex done elsewhere showed possible stent stenosis. After careful discussion of risks, benefits, and alternatives the patient was offered fistulagram. The patient understood and wished to proceed.  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 The patient was positioned supine on the operating room table. Anesthesia was induced. The left arm was prepped and draped in standard fashion. A surgical pause was performed confirming correct patient, procedure, and operative location.  Lidocaine  was injected over the peripheral fistula. Duplex ultrasound was used to guide micropuncture access in the left fistula. Seldinger technique was used to place a microsheath into the fistula.  Fistulogram  was performed in stations.  One half stenosis was noted in the upper arm, peripheral to the previously placed cephalic arch stenting.  This was angioplastied with a 7 x 40 mm balloon.  Follow-up imaging was obtained, showing little change in the stenosis, despite an appropriately sized balloon.  With the wire across the cephalic arch stenting, the thrill in the fistula improved significantly.  Overall, I think there is a mechanical problem with a long cephalic arch stent.  There are no endovascular options for improving the access.  Recommend continued use of the access.  Will see him in the clinic in about a month.  Upon completion of the case instrument and sharps counts were confirmed correct. The patient was transferred to the PACU in good condition. I was present for all portions of the procedure.  FOLLOW UP PLAN: Assuming a normal postoperative course, VVS PA will see the patient in 4 weeks with AVF duplex.   Debby SAILOR. Magda, MD [facility] Ve[facility] Specialists of [facility] Phone Number: [phone] 05/21/2023 8:39 AM

## 2023-05-23 ENCOUNTER — Encounter (HOSPITAL_COMMUNITY): Payer: Self-pay | Admitting: Vascular Surgery

## 2023-06-03 ENCOUNTER — Other Ambulatory Visit (INDEPENDENT_AMBULATORY_CARE_PROVIDER_SITE_OTHER): Payer: Self-pay | Admitting: Vascular Surgery

## 2023-06-03 DIAGNOSIS — N186 End stage renal disease: Secondary | ICD-10-CM

## 2023-06-13 NOTE — Progress Notes (Deleted)
 MRN : [id number]  Taylor Willis is a 83 y.o. (09-21-1940) male who presents with chief complaint of check access.  History of Present Illness:   The patient returns to the office for follow up regarding a problem with their dialysis access.   The patient notes a significant increase in problems with dialysis.  It is reported that adequate dialysis is not being achieved.    The patient denies hand pain or other symptoms consistent with steal phenomena.  No significant arm swelling.  The patient denies redness or swelling at the access site. The patient denies fever or chills at home or while on dialysis.  No recent shortening of the patient's walking distance or new symptoms consistent with claudication.  No history of rest pain symptoms. No new ulcers or wounds of the lower extremities have occurred.  The patient denies amaurosis fugax or recent TIA symptoms. There are no recent neurological changes noted. There is no history of DVT, PE or superficial thrombophlebitis. No recent episodes of angina or shortness of breath documented.   Duplex ultrasound of the AV access shows a patent access.  The previously noted stenosis is significantly increased compared to last study.  Flow volume today is *** cc/min (previous flow volume was *** cc/min)  No outpatient medications have been marked as taking for the 06/14/23 encounter (Appointment) with Prescilla Brod, Ninette Basque, MD.   Current Facility-Administered Medications for the 06/14/23 encounter (Appointment) with Prescilla Brod, Ninette Basque, MD  Medication   0.9 %  sodium chloride  infusion   sodium chloride  flush (NS) 0.9 % injection 3 mL   sodium chloride  flush (NS) 0.9 % injection 3 mL    Past Medical History:  Diagnosis Date   Arthritis    gout   Asthma    has rescue inhaler   Cancer (HCC)    prostate   Chronic kidney disease    GERD (gastroesophageal reflux disease)    H/O peptic ulcer 1970's   Herpes 04/28/2020    Hyperlipidemia    Hypertension    Ureteral stone    Wears dentures    Wears glasses     Past Surgical History:  Procedure Laterality Date   A/V FISTULAGRAM Left 10/09/2022   Procedure: A/V Fistulagram;  Surgeon: Carlene Che, MD;  Location: [facility];  Service: Cardiovascular;  Laterality: Left;   A/V FISTULAGRAM N/A 02/26/2023   Procedure: A/V Fistulagram;  Surgeon: Jackquelyn Mass, MD;  Location: [facility];  Service: Cardiovascular;  Laterality: N/A;   A/V FISTULAGRAM N/A 05/21/2023   Procedure: A/V Fistulagram;  Surgeon: Carlene Che, MD;  Location: [facility];  Service: Cardiovascular;  Laterality: N/A;   AV FISTULA PLACEMENT Left 02/15/2019   Procedure: ARTERIOVENOUS (AV) FISTULA CREATION LEFT ARM;  Surgeon: Mayo Speck, MD;  Location: [facility];  Service: Vascular;  Laterality: Left;   CATARACT EXTRACTION W/ INTRAOCULAR LENS  IMPLANT, BILATERAL     CYSTOSCOPY/RETROGRADE/URETEROSCOPY/STONE EXTRACTION WITH BASKET Right 05/23/2012   Procedure: CYSTOSCOPY/RETROGRADE/URETEROSCOPY/STONE EXTRACTION WITH BASKET;  Surgeon: Livingston Rigg, MD;  Location: [facility];  Service: Urology;  Laterality: Right;   DIALYSIS/PERMA CATHETER INSERTION N/A 02/26/2023   Procedure: DIALYSIS/PERMA CATHETER INSERTION;  Surgeon: Jackquelyn Mass, MD;  Location: [facility];  Service: Cardiovascular;  Laterality: N/A;   FRACTURE SURGERY Right    as a child  HERNIA REPAIR     HOLMIUM LASER APPLICATION Right 05/23/2012   Procedure: HOLMIUM LASER APPLICATION;  Surgeon: Livingston Rigg, MD;  Location: [facility];  Service: Urology;  Laterality: Right;   INSERTION OF DIALYSIS CATHETER Right 04/08/2020   Procedure: INSERTION OF TUNNELED DIALYSIS CATHETER;  Surgeon: Awilda Bogus, MD;  Location: [facility];  Service: General;  Laterality: Right;   IR FLUORO GUIDE CV LINE RIGHT  04/16/2023   LIGATION OF ARTERIOVENOUS  FISTULA Left 09/12/2019    Procedure: SIDE BRANCH LIGATION  OF LEFT ARM ARTERIOVENOUS FISTULA;  Surgeon: Richrd Char, MD;  Location: [facility] OR;  Service: Vascular;  Laterality: Left;   MULTIPLE TOOTH EXTRACTIONS     PERIPHERAL VASCULAR BALLOON ANGIOPLASTY  05/22/2020   Procedure: PERIPHERAL VASCULAR BALLOON ANGIOPLASTY;  Surgeon: Carlene Che, MD;  Location: [facility];  Service: Cardiovascular;;  Left arm fistula   PERIPHERAL VASCULAR BALLOON ANGIOPLASTY Left 10/09/2022   Procedure: PERIPHERAL VASCULAR BALLOON ANGIOPLASTY;  Surgeon: Carlene Che, MD;  Location: [facility];  Service: Cardiovascular;  Laterality: Left;   REMOVAL OF A DIALYSIS CATHETER Right 04/18/2023   Procedure: REMOVAL, DIALYSIS CATHETER and Repair of Right Jugular;  Surgeon: Carlene Che, MD;  Location: [facility] OR;  Service: Vascular;  Laterality: Right;   REVISON OF ARTERIOVENOUS FISTULA Left 09/12/2019   Procedure: REVISON OF LEFT ARM ARTERIOVENOUS FISTULA;  Surgeon: Richrd Char, MD;  Location: [facility];  Service: Vascular;  Laterality: Left;   ROBOT ASSISTED LAPAROSCOPIC RADICAL PROSTATECTOMY  2011   TONSILLECTOMY      Social History Social History   Tobacco Use   Smoking status: Former    Current packs/day: 0.00    Types: Cigarettes    Quit date: 05/18/1982    Years since quitting: 41.0   Smokeless tobacco: Never  Vaping Use   Vaping status: Never Used  Substance Use Topics   Alcohol  use: Not Currently    Alcohol /week: 1.0 standard drink of alcohol     Types: 1 Standard drinks or equivalent per week   Drug use: Never    Family History No family history on file.  Allergies  Allergen Reactions   Aspirin Other (See Comments)    GI irritation    Hydrochlorothiazide     Dizziness and unable to walk   Thiazide-Type Diuretics Other (See Comments)    Dizziness and unable to walk   Ibuprofen Other (See Comments)    GI irritation      REVIEW OF SYSTEMS (Negative unless checked)  Constitutional: [] Weight  loss  [] Fever  [] Chills Cardiac: [] Chest pain   [] Chest pressure   [] Palpitations   [] Shortness of breath when laying flat   [] Shortness of breath with exertion. Vascular:  [] Pain in legs with walking   [] Pain in legs at rest  [] History of DVT   [] Phlebitis   [] Swelling in legs   [] Varicose veins   [] Non-healing ulcers Pulmonary:   [] Uses home oxygen   [] Productive cough   [] Hemoptysis   [] Wheeze  [] COPD   [] Asthma Neurologic:  [] Dizziness   [] Seizures   [] History of stroke   [] History of TIA  [] Aphasia   [] Vissual changes   [] Weakness or numbness in arm   [] Weakness or numbness in leg Musculoskeletal:   [] Joint swelling   [] Joint pain   [] Low back pain Hematologic:  [] Easy bruising  [] Easy bleeding   [] Hypercoagulable state   [] Anemic Gastrointestinal:  [] Diarrhea   [] Vomiting  [] Gastroesophageal reflux/heartburn   []   Difficulty swallowing. Genitourinary:  [x] Chronic kidney disease   [] Difficult urination  [] Frequent urination   [] Blood in urine Skin:  [] Rashes   [] Ulcers  Psychological:  [] History of anxiety   []  History of major depression.  Physical Examination  There were no vitals filed for this visit. There is no height or weight on file to calculate BMI. Gen: WD/WN, NAD Head: [place]/AT, No temporalis wasting.  Ear/Nose/Throat: Hearing grossly intact, nares w/o erythema or drainage Eyes: PER, EOMI, sclera nonicteric.  Neck: Supple, no gross masses or lesions.  No JVD.  Pulmonary:  Good air movement, no audible wheezing, no use of accessory muscles.  Cardiac: RRR, precordium non-hyperdynamic. Vascular:   *** Vessel Right Left  Radial Palpable Palpable  Brachial Palpable Palpable  Gastrointestinal: soft, non-distended. No guarding/no peritoneal signs.  Musculoskeletal: M/S 5/5 throughout.  No deformity.  Neurologic: CN 2-12 intact. Pain and light touch intact in extremities.  Symmetrical.  Speech is fluent. Motor exam as listed above. Psychiatric: Judgment intact, Mood & affect  appropriate for pt's clinical situation. Dermatologic: No rashes or ulcers noted.  No changes consistent with cellulitis.   CBC Lab Results  Component Value Date   WBC 5.4 04/19/2023   HGB 12.9 (L) 05/21/2023   HCT 38.0 (L) 05/21/2023   MCV 98.7 04/19/2023   PLT 53 (L) 04/19/2023    BMET    Component Value Date/Time   NA 140 05/21/2023 0612   K 3.4 (L) 05/21/2023 0612   CL 98 05/21/2023 0612   CO2 21 (L) 04/19/2023 0352   GLUCOSE 78 05/21/2023 0612   BUN 16 05/21/2023 0612   CREATININE 3.50 (H) 05/21/2023 0612   CALCIUM  8.0 (L) 04/19/2023 0352   CALCIUM  8.9 04/04/2020 1528   GFRNONAA 6 (L) 04/19/2023 0352   GFRAA (L) 07/30/2009 0530    41        The eGFR has been calculated using the MDRD equation. This calculation has not been validated in all clinical situations. eGFR's persistently <60 mL/min signify possible Chronic Kidney Disease.   CrCl cannot be calculated (Patient's most recent lab result is older than the maximum 21 days allowed.).  COAG Lab Results  Component Value Date   INR 1.5 (H) 04/18/2023   INR 1.4 (H) 04/17/2023   INR 1.3 (H) 02/23/2023    Radiology PERIPHERAL VASCULAR CATHETERIZATION Result Date: 05/21/2023 DATE OF SERVICE: 05/21/2023  PATIENT:  Taylor Willis  83 y.o. male  PRE-OPERATIVE DIAGNOSIS:  ESRD  POST-OPERATIVE DIAGNOSIS:  Same  PROCEDURE:  1) ultrasound guided left arm arteriovenous fistula access 2) left arm fistulagram 3) left AVF angioplasty (7x24mm Mustang)  SURGEON:  Surgeons and Role:    * Carlene Che, MD - Primary  ASSISTANT: none  ANESTHESIA:   local  EBL: minimal  BLOOD ADMINISTERED:none  DRAINS: none  LOCAL MEDICATIONS USED:  LIDOCAINE   SPECIMEN:  none  COUNTS: confirmed correct.  TOURNIQUET:  none  PATIENT DISPOSITION:  PACU - hemodynamically stable.  Delay start of Pharmacological VTE agent (>24hrs) due to surgical blood loss or risk of bleeding: no  INDICATION FOR PROCEDURE: Taylor Willis is a 83 y.o. male with ESRD  dialyzing through left arm AVF. Duplex done elsewhere showed possible stent stenosis. After careful discussion of risks, benefits, and alternatives the patient was offered fistulagram. The patient understood and wished to proceed.  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 The patient was positioned supine on the operating room table. Anesthesia was induced. The left arm was prepped and draped in standard fashion. A surgical pause was performed confirming correct patient, procedure, and operative location.  Lidocaine  was injected over the peripheral fistula. Duplex ultrasound was used to guide micropuncture access in the left fistula. Seldinger technique was used to place a microsheath into the fistula.  Fistulogram was performed in stations.  One half stenosis was noted in the upper arm, peripheral to the previously placed cephalic arch stenting.  This was angioplastied with a 7 x 40 mm balloon.  Follow-up imaging was obtained, showing little change in the stenosis, despite an appropriately sized balloon.  With the wire across the cephalic arch stenting, the thrill in the fistula improved significantly.  Overall, I think there is a mechanical problem with a long cephalic arch stent.  There are no endovascular options for improving the access.  Recommend continued use of the access.  Will see him in the clinic in about a month.  Upon completion of the case instrument and sharps counts were confirmed correct. The patient was transferred to the PACU in good condition. I was present for all portions of the  procedure.  FOLLOW UP PLAN: Assuming a normal postoperative course, VVS PA will see the patient in 4 weeks with AVF duplex.  Taylor Little. Edgardo Goodwill, MD [facility] Phone Number: [phone] 05/21/2023 8:39 AM      Assessment/Plan There are no diagnoses linked to this encounter.   Devon Fogo, MD  06/13/2023 7:56 PM

## 2023-06-14 ENCOUNTER — Ambulatory Visit (INDEPENDENT_AMBULATORY_CARE_PROVIDER_SITE_OTHER): Payer: Medicare Other | Admitting: Vascular Surgery

## 2023-06-14 ENCOUNTER — Encounter (INDEPENDENT_AMBULATORY_CARE_PROVIDER_SITE_OTHER): Payer: Medicare Other

## 2023-06-14 DIAGNOSIS — N186 End stage renal disease: Secondary | ICD-10-CM

## 2023-06-14 DIAGNOSIS — I10 Essential (primary) hypertension: Secondary | ICD-10-CM

## 2023-06-14 DIAGNOSIS — J4520 Mild intermittent asthma, uncomplicated: Secondary | ICD-10-CM

## 2023-06-24 ENCOUNTER — Inpatient Hospital Stay (HOSPITAL_COMMUNITY)
Admission: EM | Admit: 2023-06-24 | Discharge: 2023-07-04 | DRG: 951 | Disposition: E | Attending: Family Medicine | Admitting: Family Medicine

## 2023-06-24 ENCOUNTER — Encounter (HOSPITAL_COMMUNITY): Payer: Self-pay

## 2023-06-24 ENCOUNTER — Emergency Department (HOSPITAL_COMMUNITY)

## 2023-06-24 DIAGNOSIS — Z992 Dependence on renal dialysis: Secondary | ICD-10-CM

## 2023-06-24 DIAGNOSIS — J9601 Acute respiratory failure with hypoxia: Secondary | ICD-10-CM | POA: Diagnosis present

## 2023-06-24 DIAGNOSIS — Z8546 Personal history of malignant neoplasm of prostate: Secondary | ICD-10-CM

## 2023-06-24 DIAGNOSIS — Z6831 Body mass index (BMI) 31.0-31.9, adult: Secondary | ICD-10-CM | POA: Diagnosis not present

## 2023-06-24 DIAGNOSIS — S2239XA Fracture of one rib, unspecified side, initial encounter for closed fracture: Secondary | ICD-10-CM | POA: Diagnosis present

## 2023-06-24 DIAGNOSIS — Z7901 Long term (current) use of anticoagulants: Secondary | ICD-10-CM | POA: Diagnosis not present

## 2023-06-24 DIAGNOSIS — Z79899 Other long term (current) drug therapy: Secondary | ICD-10-CM

## 2023-06-24 DIAGNOSIS — Z87891 Personal history of nicotine dependence: Secondary | ICD-10-CM

## 2023-06-24 DIAGNOSIS — I69351 Hemiplegia and hemiparesis following cerebral infarction affecting right dominant side: Secondary | ICD-10-CM | POA: Diagnosis not present

## 2023-06-24 DIAGNOSIS — I10 Essential (primary) hypertension: Secondary | ICD-10-CM | POA: Diagnosis present

## 2023-06-24 DIAGNOSIS — J45909 Unspecified asthma, uncomplicated: Secondary | ICD-10-CM | POA: Diagnosis present

## 2023-06-24 DIAGNOSIS — Z1152 Encounter for screening for COVID-19: Secondary | ICD-10-CM

## 2023-06-24 DIAGNOSIS — Z515 Encounter for palliative care: Principal | ICD-10-CM

## 2023-06-24 DIAGNOSIS — M4854XA Collapsed vertebra, not elsewhere classified, thoracic region, initial encounter for fracture: Secondary | ICD-10-CM | POA: Diagnosis present

## 2023-06-24 DIAGNOSIS — D696 Thrombocytopenia, unspecified: Secondary | ICD-10-CM | POA: Insufficient documentation

## 2023-06-24 DIAGNOSIS — R627 Adult failure to thrive: Secondary | ICD-10-CM | POA: Diagnosis present

## 2023-06-24 DIAGNOSIS — K219 Gastro-esophageal reflux disease without esophagitis: Secondary | ICD-10-CM | POA: Diagnosis present

## 2023-06-24 DIAGNOSIS — S2243XA Multiple fractures of ribs, bilateral, initial encounter for closed fracture: Secondary | ICD-10-CM | POA: Diagnosis present

## 2023-06-24 DIAGNOSIS — J918 Pleural effusion in other conditions classified elsewhere: Secondary | ICD-10-CM | POA: Diagnosis present

## 2023-06-24 DIAGNOSIS — W19XXXA Unspecified fall, initial encounter: Secondary | ICD-10-CM | POA: Diagnosis present

## 2023-06-24 DIAGNOSIS — I2693 Single subsegmental pulmonary embolism without acute cor pulmonale: Secondary | ICD-10-CM | POA: Diagnosis present

## 2023-06-24 DIAGNOSIS — Z886 Allergy status to analgesic agent status: Secondary | ICD-10-CM

## 2023-06-24 DIAGNOSIS — I2694 Multiple subsegmental pulmonary emboli without acute cor pulmonale: Principal | ICD-10-CM

## 2023-06-24 DIAGNOSIS — I2699 Other pulmonary embolism without acute cor pulmonale: Secondary | ICD-10-CM | POA: Diagnosis present

## 2023-06-24 DIAGNOSIS — E785 Hyperlipidemia, unspecified: Secondary | ICD-10-CM | POA: Diagnosis present

## 2023-06-24 DIAGNOSIS — D631 Anemia in chronic kidney disease: Secondary | ICD-10-CM | POA: Diagnosis present

## 2023-06-24 DIAGNOSIS — Z66 Do not resuscitate: Secondary | ICD-10-CM | POA: Diagnosis present

## 2023-06-24 DIAGNOSIS — I120 Hypertensive chronic kidney disease with stage 5 chronic kidney disease or end stage renal disease: Secondary | ICD-10-CM | POA: Diagnosis present

## 2023-06-24 DIAGNOSIS — I4891 Unspecified atrial fibrillation: Secondary | ICD-10-CM | POA: Diagnosis present

## 2023-06-24 DIAGNOSIS — R001 Bradycardia, unspecified: Secondary | ICD-10-CM | POA: Diagnosis present

## 2023-06-24 DIAGNOSIS — J189 Pneumonia, unspecified organism: Secondary | ICD-10-CM | POA: Diagnosis present

## 2023-06-24 DIAGNOSIS — Z9079 Acquired absence of other genital organ(s): Secondary | ICD-10-CM

## 2023-06-24 DIAGNOSIS — W06XXXA Fall from bed, initial encounter: Secondary | ICD-10-CM | POA: Diagnosis present

## 2023-06-24 DIAGNOSIS — N186 End stage renal disease: Secondary | ICD-10-CM | POA: Diagnosis present

## 2023-06-24 DIAGNOSIS — I700 Atherosclerosis of aorta: Secondary | ICD-10-CM | POA: Diagnosis present

## 2023-06-24 DIAGNOSIS — Z87442 Personal history of urinary calculi: Secondary | ICD-10-CM

## 2023-06-24 DIAGNOSIS — M109 Gout, unspecified: Secondary | ICD-10-CM | POA: Diagnosis present

## 2023-06-24 DIAGNOSIS — G9341 Metabolic encephalopathy: Secondary | ICD-10-CM | POA: Diagnosis present

## 2023-06-24 DIAGNOSIS — Z87440 Personal history of urinary (tract) infections: Secondary | ICD-10-CM | POA: Diagnosis present

## 2023-06-24 DIAGNOSIS — R531 Weakness: Secondary | ICD-10-CM

## 2023-06-24 DIAGNOSIS — Z888 Allergy status to other drugs, medicaments and biological substances status: Secondary | ICD-10-CM

## 2023-06-24 DIAGNOSIS — Z8679 Personal history of other diseases of the circulatory system: Secondary | ICD-10-CM

## 2023-06-24 DIAGNOSIS — N281 Cyst of kidney, acquired: Secondary | ICD-10-CM | POA: Diagnosis present

## 2023-06-24 DIAGNOSIS — N3090 Cystitis, unspecified without hematuria: Secondary | ICD-10-CM

## 2023-06-24 DIAGNOSIS — Z8711 Personal history of peptic ulcer disease: Secondary | ICD-10-CM

## 2023-06-24 HISTORY — DX: Dependence on renal dialysis: Z99.2

## 2023-06-24 LAB — CBC WITH DIFFERENTIAL/PLATELET
Abs Immature Granulocytes: 0.03 10*3/uL (ref 0.00–0.07)
Basophils Absolute: 0 10*3/uL (ref 0.0–0.1)
Basophils Relative: 0 %
Eosinophils Absolute: 0 10*3/uL (ref 0.0–0.5)
Eosinophils Relative: 0 %
HCT: 38.9 % — ABNORMAL LOW (ref 39.0–52.0)
Hemoglobin: 12 g/dL — ABNORMAL LOW (ref 13.0–17.0)
Immature Granulocytes: 1 %
Lymphocytes Relative: 21 %
Lymphs Abs: 1.2 10*3/uL (ref 0.7–4.0)
MCH: 32.7 pg (ref 26.0–34.0)
MCHC: 30.8 g/dL (ref 30.0–36.0)
MCV: 106 fL — ABNORMAL HIGH (ref 80.0–100.0)
Monocytes Absolute: 0.5 10*3/uL (ref 0.1–1.0)
Monocytes Relative: 8 %
Neutro Abs: 4.2 10*3/uL (ref 1.7–7.7)
Neutrophils Relative %: 70 %
Platelets: 58 10*3/uL — ABNORMAL LOW (ref 150–400)
RBC: 3.67 MIL/uL — ABNORMAL LOW (ref 4.22–5.81)
RDW: 16 % — ABNORMAL HIGH (ref 11.5–15.5)
Smear Review: NORMAL
WBC: 5.9 10*3/uL (ref 4.0–10.5)
nRBC: 0.7 % — ABNORMAL HIGH (ref 0.0–0.2)

## 2023-06-24 LAB — URINALYSIS, ROUTINE W REFLEX MICROSCOPIC
Bilirubin Urine: NEGATIVE
Glucose, UA: NEGATIVE mg/dL
Ketones, ur: NEGATIVE mg/dL
Nitrite: NEGATIVE
Protein, ur: 100 mg/dL — AB
Specific Gravity, Urine: 1.019 (ref 1.005–1.030)
WBC, UA: >50 WBC/hpf (ref 0–5)
pH: 5 (ref 5.0–8.0)

## 2023-06-24 LAB — COMPREHENSIVE METABOLIC PANEL WITH GFR
ALT: 94 U/L — ABNORMAL HIGH (ref 0–44)
AST: 92 U/L — ABNORMAL HIGH (ref 15–41)
Albumin: 3.6 g/dL (ref 3.5–5.0)
Alkaline Phosphatase: 90 U/L (ref 38–126)
Anion gap: 14 (ref 5–15)
BUN: 37 mg/dL — ABNORMAL HIGH (ref 8–23)
CO2: 26 mmol/L (ref 22–32)
Calcium: 9.4 mg/dL (ref 8.9–10.3)
Chloride: 94 mmol/L — ABNORMAL LOW (ref 98–111)
Creatinine, Ser: 5 mg/dL — ABNORMAL HIGH (ref 0.61–1.24)
GFR, Estimated: 11 mL/min — ABNORMAL LOW (ref >60)
Glucose, Bld: 89 mg/dL (ref 70–99)
Potassium: 3.7 mmol/L (ref 3.5–5.1)
Sodium: 134 mmol/L — ABNORMAL LOW (ref 135–145)
Total Bilirubin: 1.1 mg/dL (ref 0.0–1.2)
Total Protein: 6.9 g/dL (ref 6.5–8.1)

## 2023-06-24 LAB — RESP PANEL BY RT-PCR (RSV, FLU A&B, COVID)  RVPGX2
Influenza A by PCR: NEGATIVE
Influenza B by PCR: NEGATIVE
Resp Syncytial Virus by PCR: NEGATIVE
SARS Coronavirus 2 by RT PCR: NEGATIVE

## 2023-06-24 LAB — LACTIC ACID, PLASMA: Lactic Acid, Venous: 1.7 mmol/L (ref 0.5–1.9)

## 2023-06-24 LAB — PROTIME-INR
INR: 2.1 — ABNORMAL HIGH (ref 0.8–1.2)
Prothrombin Time: 23.8 s — ABNORMAL HIGH (ref 11.4–15.2)

## 2023-06-24 MED ORDER — ACETAMINOPHEN 650 MG RE SUPP
650.0000 mg | Freq: Four times a day (QID) | RECTAL | Status: DC | PRN
Start: 2023-06-24 — End: 2023-06-26

## 2023-06-24 MED ORDER — ACETAMINOPHEN 325 MG PO TABS: 650.0000 mg | ORAL_TABLET | Freq: Four times a day (QID) | ORAL | Status: DC | PRN

## 2023-06-24 MED ORDER — ONDANSETRON HCL 4 MG/2ML IJ SOLN: 4.0000 mg | Freq: Four times a day (QID) | INTRAMUSCULAR | Status: DC | PRN

## 2023-06-24 MED ORDER — HYDROMORPHONE HCL 1 MG/ML IJ SOLN
0.5000 mg | INTRAMUSCULAR | Status: DC | PRN
  Administered 2023-06-26 (×2): 1 mg via INTRAVENOUS
  Administered 2023-06-26: 2 mg via INTRAVENOUS
  Filled 2023-06-24: qty 2
  Filled 2023-06-24 (×2): qty 1

## 2023-06-24 MED ORDER — GLYCOPYRROLATE 0.2 MG/ML IJ SOLN: 0.2000 mg | INTRAMUSCULAR | Status: DC | PRN

## 2023-06-24 MED ORDER — HALOPERIDOL 0.5 MG PO TABS: 0.5000 mg | ORAL_TABLET | ORAL | Status: DC | PRN

## 2023-06-24 MED ORDER — GLYCOPYRROLATE 0.2 MG/ML IJ SOLN
0.2000 mg | INTRAMUSCULAR | Status: DC | PRN
  Administered 2023-06-24: 0.2 mg via INTRAVENOUS
  Filled 2023-06-24: qty 1

## 2023-06-24 MED ORDER — SODIUM CHLORIDE 0.9 % IV SOLN
1.0000 g | Freq: Once | INTRAVENOUS | Status: AC
  Administered 2023-06-24: 1 g via INTRAVENOUS
  Filled 2023-06-24: qty 10

## 2023-06-24 MED ORDER — HEPARIN (PORCINE) 25000 UT/250ML-% IV SOLN
1500.0000 [IU]/h | INTRAVENOUS | Status: DC
  Administered 2023-06-24: 1500 [IU]/h via INTRAVENOUS
  Filled 2023-06-24: qty 250

## 2023-06-24 MED ORDER — HALOPERIDOL LACTATE 2 MG/ML PO CONC: 0.5000 mg | ORAL | Status: DC | PRN

## 2023-06-24 MED ORDER — HALOPERIDOL LACTATE 5 MG/ML IJ SOLN: 0.5000 mg | INTRAMUSCULAR | Status: DC | PRN

## 2023-06-24 MED ORDER — ONDANSETRON 4 MG PO TBDP: 4.0000 mg | ORAL_TABLET | Freq: Four times a day (QID) | ORAL | Status: DC | PRN

## 2023-06-24 MED ORDER — POLYVINYL ALCOHOL 1.4 % OP SOLN
1.0000 [drp] | Freq: Four times a day (QID) | OPHTHALMIC | Status: DC | PRN
  Administered 2023-06-25: 1 [drp] via OPHTHALMIC
  Filled 2023-06-24: qty 15

## 2023-06-24 MED ORDER — HEPARIN BOLUS VIA INFUSION
5500.0000 [IU] | Freq: Once | INTRAVENOUS | Status: AC
  Administered 2023-06-24: 5500 [IU] via INTRAVENOUS

## 2023-06-24 MED ORDER — OXYCODONE HCL 20 MG/ML PO CONC
5.0000 mg | ORAL | Status: DC | PRN
  Filled 2023-06-24: qty 0.5

## 2023-06-24 MED ORDER — OXYCODONE HCL 20 MG/ML PO CONC
5.0000 mg | ORAL | Status: DC | PRN
  Administered 2023-06-25 – 2023-06-26 (×3): 5 mg via ORAL
  Filled 2023-06-24 (×2): qty 0.5

## 2023-06-24 MED ORDER — GLYCOPYRROLATE 1 MG PO TABS: 1.0000 mg | ORAL_TABLET | ORAL | Status: DC | PRN

## 2023-06-24 MED ORDER — IOHEXOL 350 MG/ML SOLN
75.0000 mL | Freq: Once | INTRAVENOUS | Status: AC | PRN
  Administered 2023-06-24: 75 mL via INTRAVENOUS

## 2023-06-24 MED ORDER — BIOTENE DRY MOUTH MT LIQD: 15.0000 mL | OROMUCOSAL | Status: DC | PRN

## 2023-06-24 NOTE — ED Notes (Signed)
 Received report from Brett Camera. Pt resting. No obvious shob/resp distress noted. Wife at bedside. Pt slightly groggy

## 2023-06-24 NOTE — ED Notes (Signed)
 Pt 100% mouth breathing with some abd labored breathing. Responds to loud name calling. Hr in a fib in 50s. Discoloration/bruising noted to upper right chest, bilateral arms and legs from previous falls.

## 2023-06-24 NOTE — ED Notes (Addendum)
 Pt taken to ct Clarified with edp and does not want any fluids at this time.

## 2023-06-24 NOTE — ED Provider Notes (Signed)
 [facility] EMERGENCY DEPARTMENT AT [facility] Provider Note  CSN: [id number] Arrival date & time: 06/24/23 1132  Chief Complaint(s) Shortness of Breath  HPI Taylor Willis is a 83 y.o. male with past medical history as below, significant for ESRD on HD TTS, HLD, HTN, CVA, prostate cancer, A-fib who presents to the ED with complaint of dyspnea, cough, weakness generalized  Arrives by EMS, report that he was diagnosed with pneumonia on Monday, last dialysis was on Tuesday.  Worsening difficulty breathing, worsening fatigue and generalized weakness.  Poor p.o. intake, poor appetite.  Typically uses a walker but has not been able to ambulate over the past week secondary to weakness.    Recent admission for right IJ dialysis catheter bleeding, was admitted to [facility] with vascular evaluation; he underwent removal of the tunneled dialysis catheter to the right IJ with direct repair Dr. Edgardo Goodwill.  He underwent left arm AV fistula gram on 4/18 with Dr. Edgardo Goodwill, there was improvement to the thrill in the fistula operative note but there was concern for mechanical problem with the long cephalic arch stent.  Continue to use access, did recommend follow-up with vascular in 4 weeks.  Past Medical History Past Medical History:  Diagnosis Date   Arthritis    gout   Asthma    has rescue inhaler   Cancer [facility])    prostate   Chronic kidney disease    Dialysis patient (HCC)    Left Arm Restriction   GERD (gastroesophageal reflux disease)    H/O peptic ulcer 1970's   Herpes 04/28/2020   Hyperlipidemia    Hypertension    Ureteral stone    Wears dentures    Wears glasses    Patient Active Problem List   Diagnosis Date Noted   Complication associated with dialysis catheter 04/18/2023   History of UTI 04/17/2023   History of atrial fibrillation 04/17/2023   Problem with dialysis access, initial encounter (HCC) 02/25/2023   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   ESRD (end stage renal disease) (HCC) 04/10/2020   Acute metabolic encephalopathy 04/10/2020   Acute respiratory failure with hypoxia (HCC) 04/04/2020   Pulmonary edema 04/04/2020   GERD (gastroesophageal reflux disease) 04/04/2020   Prostate cancer (HCC) 04/04/2020   History of asthma 04/04/2020   CKD (chronic kidney disease) stage 5, GFR less than 15 ml/min (HCC) 10/12/2019   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 Authorizing Provider  acetaminophen  (TYLENOL ) 500 MG tablet Take 500 mg by mouth every 6 (six) hours as needed for moderate pain.    [provider]  albuterol  (PROVENTIL  HFA;VENTOLIN  HFA) 108 (90 BASE) MCG/ACT inhaler Inhale 2 puffs into the lungs every 6 (six) hours as needed (Asthma).    [provider]  allopurinol  (ZYLOPRIM ) 100 MG tablet Take 100 mg by mouth at bedtime. 12/04/19   [provider]  apixaban  (ELIQUIS ) 2.5 MG TABS tablet Take 1 tablet (  2.5 mg total) by mouth See admin instructions. Take 2.5 mg Tues., Thurs.,and Sat, in the evening Take 2.5 mg on Monday, Wednesday, Friday and Sunday twice a day Patient not taking: Reported on 04/18/2023 02/27/23   Aisha Hove, MD  apixaban  (ELIQUIS ) 5 MG TABS tablet Take 2.5 mg by mouth See admin instructions. Take 2.5 mg by mouth on Tues., Thurs.,and Sat, in the evening @2000  and 2.5 mg rest of  days during the morning only @0930  per spouse    [provider]  B Complex-C-Folic Acid  (TRIPHROCAPS) 1 MG CAPS Take 1 capsule by mouth daily.    [provider]  calcitRIOL  (ROCALTROL ) 0.25 MCG capsule Take 0.25 mcg by mouth Every Tuesday,Thursday,and Saturday with dialysis.    [provider]  carboxymethylcellulose 1 % ophthalmic solution Place 1 drop into both eyes daily as needed (Dry eyes).    [provider]  carvedilol  (COREG ) 3.125 MG tablet Take 1 tablet (3.125 mg total) by mouth See admin instructions. Take 1 tablet in the evenings on Dialysis days (Tues, Thurs, and Sat), On Non-dialysis days take 1 tablet twice a day. Patient taking differently: Take 3.125 mg by mouth every evening. 04/29/20   Colin Dawley, MD  cefdinir (OMNICEF) 300 MG capsule Take 300 mg by mouth 2 (two) times daily. 04/13/23   [provider]  furosemide  (LASIX ) 40 MG tablet Take 120 mg by mouth daily. 07/17/21   [provider]  multivitamin (RENA-VIT) TABS tablet Take 1 tablet by mouth at bedtime. 07/25/21   [provider]  rosuvastatin  (CRESTOR ) 5 MG tablet Take 5 mg by mouth at bedtime.    [provider]  sertraline  (ZOLOFT ) 25 MG tablet Take 25 mg by mouth daily. 07/29/21   [provider]  sevelamer  carbonate (RENVELA ) 2.4 g PACK Take 2.4 g by mouth 3 (three) times daily with meals.    [provider]  traZODone  (DESYREL ) 50 MG tablet Take 25 mg by mouth at bedtime.    [provider]  vitamin B-12 (CYANOCOBALAMIN ) 1000 MCG tablet Take 1,000 mcg by mouth daily.    [provider]                                                                                                                                    Past Surgical History Past Surgical History:  Procedure Laterality Date   A/V FISTULAGRAM Left 10/09/2022   Procedure: A/V Fistulagram;  Surgeon: Carlene Che, MD;  Location: [facility] INVASIVE CV LAB;   Service: Cardiovascular;  Laterality: Left;   A/V FISTULAGRAM N/A 02/26/2023   Procedure: A/V Fistulagram;  Surgeon: Jackquelyn Mass, MD;  Location: [facility] INVASIVE CV LAB;  Service: Cardiovascular;  Laterality: N/A;   A/V FISTULAGRAM N/A 05/21/2023   Procedure: A/V Fistulagram;  Surgeon: Carlene Che, MD;  Location: [facility] INVASIVE CV LAB;  Service: Cardiovascular;  Laterality: N/A;   AV FISTULA PLACEMENT Left  02/15/2019   Procedure: ARTERIOVENOUS (AV) FISTULA CREATION LEFT ARM;  Surgeon: Mayo Speck, MD;  Location: [facility] OR;  Service: Vascular;  Laterality: Left;   CATARACT EXTRACTION W/ INTRAOCULAR LENS  IMPLANT, BILATERAL     CYSTOSCOPY/RETROGRADE/URETEROSCOPY/STONE EXTRACTION WITH BASKET Right 05/23/2012   Procedure: CYSTOSCOPY/RETROGRADE/URETEROSCOPY/STONE EXTRACTION WITH BASKET;  Surgeon: Livingston Rigg, MD;  Location: [facility];  Service: Urology;  Laterality: Right;   DIALYSIS/PERMA CATHETER INSERTION N/A 02/26/2023   Procedure: DIALYSIS/PERMA CATHETER INSERTION;  Surgeon: Jackquelyn Mass, MD;  Location: [facility] INVASIVE CV LAB;  Service: Cardiovascular;  Laterality: N/A;   FRACTURE SURGERY Right    as a child   HERNIA REPAIR     HOLMIUM LASER APPLICATION Right 05/23/2012   Procedure: HOLMIUM LASER APPLICATION;  Surgeon: Livingston Rigg, MD;  Location: [facility];  Service: Urology;  Laterality: Right;   INSERTION OF DIALYSIS CATHETER Right 04/08/2020   Procedure: INSERTION OF TUNNELED DIALYSIS CATHETER;  Surgeon: Awilda Bogus, MD;  Location: [facility];  Service: General;  Laterality: Right;   IR FLUORO GUIDE CV LINE RIGHT  04/16/2023   LIGATION OF ARTERIOVENOUS  FISTULA Left 09/12/2019   Procedure: SIDE BRANCH LIGATION  OF LEFT ARM ARTERIOVENOUS FISTULA;  Surgeon: Richrd Char, MD;  Location: [facility] OR;  Service: Vascular;  Laterality: Left;   MULTIPLE TOOTH EXTRACTIONS     PERIPHERAL VASCULAR BALLOON ANGIOPLASTY  05/22/2020   Procedure: PERIPHERAL VASCULAR  BALLOON ANGIOPLASTY;  Surgeon: Carlene Che, MD;  Location: [facility] INVASIVE CV LAB;  Service: Cardiovascular;;  Left arm fistula   PERIPHERAL VASCULAR BALLOON ANGIOPLASTY Left 10/09/2022   Procedure: PERIPHERAL VASCULAR BALLOON ANGIOPLASTY;  Surgeon: Carlene Che, MD;  Location: [facility] INVASIVE CV LAB;  Service: Cardiovascular;  Laterality: Left;   REMOVAL OF A DIALYSIS CATHETER Right 04/18/2023   Procedure: REMOVAL, DIALYSIS CATHETER and Repair of Right Jugular;  Surgeon: Carlene Che, MD;  Location: [facility] OR;  Service: Vascular;  Laterality: Right;   REVISON OF ARTERIOVENOUS FISTULA Left 09/12/2019   Procedure: REVISON OF LEFT ARM ARTERIOVENOUS FISTULA;  Surgeon: Richrd Char, MD;  Location: [facility] OR;  Service: Vascular;  Laterality: Left;   ROBOT ASSISTED LAPAROSCOPIC RADICAL PROSTATECTOMY  2011   TONSILLECTOMY     Family History No family history on file.  Social History Social History   Tobacco Use   Smoking status: Former    Current packs/day: 0.00    Types: Cigarettes    Quit date: 05/18/1982    Years since quitting: 41.1   Smokeless tobacco: Never  Vaping Use   Vaping status: Never Used  Substance Use Topics   Alcohol  use: Not Currently    Alcohol /week: 1.0 standard drink of alcohol     Types: 1 Standard drinks or equivalent per week   Drug use: Never   Allergies Aspirin, Hydrochlorothiazide, Thiazide-type diuretics, and Ibuprofen  Review of Systems A thorough review of systems was obtained and all systems are negative except as noted in the HPI and PMH.   Physical Exam Vital Signs  I have reviewed the triage vital signs BP (!) 167/71   Pulse (!) 50   Temp 97.7 F (36.5 C) (Axillary)   Resp (!) 23   Ht 5\' 9"  (1.753 m)   Wt 95.3 kg   SpO2 100%   BMI 31.01 kg/m  Physical Exam Vitals and nursing note reviewed.  Constitutional:      General: He is not in acute distress.    Appearance: He is  well-developed.  HENT:     Head: Normocephalic and atraumatic.      Right Ear: External ear normal.     Left Ear: External ear normal.     Mouth/Throat:     Mouth: Mucous membranes are dry.  Eyes:     General: No scleral icterus. Cardiovascular:     Rate and Rhythm: Normal rate. Rhythm irregular.     Pulses: Normal pulses.     Heart sounds: Normal heart sounds.  Pulmonary:     Effort: Pulmonary effort is normal. Tachypnea present. No respiratory distress.     Breath sounds: Decreased breath sounds present.  Abdominal:     General: Abdomen is flat.     Palpations: Abdomen is soft.     Tenderness: There is no abdominal tenderness.  Musculoskeletal:     Cervical back: No rigidity.     Right lower leg: Edema present.     Left lower leg: No edema.     Comments: RLE lymphadema noted > LLE  Multiple areas of bruising noted to lower extremities  Skin:    General: Skin is warm and dry.     Capillary Refill: Capillary refill takes less than 2 seconds.          Comments: He has bruising to extremities and multiple skin tears  Neurological:     Mental Status: He is alert and oriented to person, place, and time.     GCS: GCS eye subscore is 4. GCS verbal subscore is 5. GCS motor subscore is 6.     Cranial Nerves: Cranial nerves 2-12 are intact.     Sensory: Sensation is intact.     Motor: Weakness present.     Coordination: Coordination is intact.     Comments: Weakness noted right upper and lower extremity chronic/unchanged per spouse at bedside  Gait testing deferred secondary to patient safety.   Psychiatric:        Mood and Affect: Mood normal.        Behavior: Behavior normal.     ED Results and Treatments Labs (all labs ordered are listed, but only abnormal results are displayed) Labs Reviewed  COMPREHENSIVE METABOLIC PANEL WITH GFR - Abnormal; Notable for the following components:      Result Value   Sodium 134 (*)    Chloride 94 (*)    BUN 37 (*)    Creatinine, Ser 5.00 (*)    AST 92 (*)    ALT 94 (*)    GFR, Estimated 11 (*)     All other components within normal limits  CBC WITH DIFFERENTIAL/PLATELET - Abnormal; Notable for the following components:   RBC 3.67 (*)    Hemoglobin 12.0 (*)    HCT 38.9 (*)    MCV 106.0 (*)    RDW 16.0 (*)    Platelets 58 (*)    nRBC 0.7 (*)    All other components within normal limits  PROTIME-INR - Abnormal; Notable for the following components:   Prothrombin Time 23.8 (*)    INR 2.1 (*)    All other components within normal limits  CULTURE, BLOOD (ROUTINE X 2)  CULTURE, BLOOD (ROUTINE X 2)  RESP PANEL BY RT-PCR (RSV, FLU A&B, COVID)  RVPGX2  LACTIC ACID, PLASMA  URINALYSIS, ROUTINE W REFLEX MICROSCOPIC  Radiology   DG Chest Port 1 View Result Date: 06/24/2023 CLINICAL DATA:  Sepsis, shortness of breath. EXAM: PORTABLE CHEST 1 VIEW COMPARISON:  Jun 21, 2023. FINDINGS: Stable cardiomediastinal silhouette with central pulmonary vascular congestion. Hypoinflation of the lungs is noted. No definite consolidative process is noted. Bony thorax is unremarkable. IMPRESSION: Mild central pulmonary vascular congestion with hypoinflation of the lungs. Electronically Signed   By: Rosalene Colon M.D.   On: 06/24/2023 14:02    Pertinent labs & imaging results that were available during my care of the patient were reviewed by me and considered in my medical decision making (see MDM for details).  Medications Ordered in ED Medications  iohexol  (OMNIPAQUE ) 350 MG/ML injection 75 mL (75 mLs Intravenous Contrast Given 06/24/23 1335)                                                                                                                                     Procedures Procedures  (including critical care time)  Medical Decision Making / ED Course    Medical Decision Making:    Taylor Willis is a 83 y.o. male with past medical history as below, significant for  ESRD on HD TTS, HLD, HTN, CVA, prostate cancer, A-fib who presents to the ED with complaint of dyspnea, cough, weakness generalized. The complaint involves an extensive differential diagnosis and also carries with it a high risk of complications and morbidity.  Serious etiology was considered. Ddx includes but is not limited to: In my evaluation of this patient's dyspnea my DDx includes, but is not limited to, pneumonia, pulmonary embolism, pneumothorax, pulmonary edema, metabolic acidosis, asthma, COPD, cardiac cause, anemia, anxiety, etc.    Complete initial physical exam performed, notably the patient was in no acute distress, no hypoxia.    Reviewed and confirmed nursing documentation for past medical history, family history, social history.  Vital signs reviewed.     Brief summary:  83 year old male history of ESRD on HD TTS here with weakness that is generalized, poor p.o. intake, cough, worsening symptoms from recent pneumonia diagnosis   Clinical Course as of 06/24/23 1556  Thu Jun 24, 2023  1320 Spoke w/ spouse at bedside, reports pt had prior stroke with right sided weakness residual that is unchanged today. Right leg swelling is chronic and perhaps worsened over the last week or so but note acutely changed. Last HD was Tuesday, no missed sessions recently  [SG]  1428 Pulse Rate(!): 50 Slow afib [SG]  1538 CT Head Wo Contrast 1. No evidence of acute intracranial abnormality. 2. Chronic left PCA infarct. 3. Moderate chronic small vessel ischemic disease.   [HN]    Clinical Course User Index [HN] Merdis Stalling, MD [SG] Teddi Favors, DO    Labs so far are stable CT imaging is pending of chest/head CXR was stable Last HD was Tuesday, did not receive HD today Pt with worsening  dib over last week, worsening fatigue/generalized weakness and now essentially unable to ambulate 2/2 weakness; would favor admission, he is pending CT. Handoff to incoming EDP pending CT and  dispo             Additional history obtained: -Additional history obtained from ems and spouse -External records from outside source obtained and reviewed including: Chart review including previous notes, labs, imaging, consultation notes including  Primary care documentation, home medications, prior labs and imaging, prior admission Recent admission for right IJ dialysis catheter bleeding, was admitted to [facility] with vascular evaluation; he underwent removal of the tunneled dialysis catheter to the right IJ with direct repair Dr. Edgardo Goodwill.  He underwent left arm AV fistula gram on 4/18 with Dr. Edgardo Goodwill, there was improvement to the thrill in the fistula operative note but there was concern for mechanical problem with the long cephalic arch stent.  Continue to use access, did recommend follow-up with vascular in 4 weeks.   Lab Tests: -I ordered, reviewed, and interpreted labs.   The pertinent results include:   Labs Reviewed  COMPREHENSIVE METABOLIC PANEL WITH GFR - Abnormal; Notable for the following components:      Result Value   Sodium 134 (*)    Chloride 94 (*)    BUN 37 (*)    Creatinine, Ser 5.00 (*)    AST 92 (*)    ALT 94 (*)    GFR, Estimated 11 (*)    All other components within normal limits  CBC WITH DIFFERENTIAL/PLATELET - Abnormal; Notable for the following components:   RBC 3.67 (*)    Hemoglobin 12.0 (*)    HCT 38.9 (*)    MCV 106.0 (*)    RDW 16.0 (*)    Platelets 58 (*)    nRBC 0.7 (*)    All other components within normal limits  PROTIME-INR - Abnormal; Notable for the following components:   Prothrombin Time 23.8 (*)    INR 2.1 (*)    All other components within normal limits  CULTURE, BLOOD (ROUTINE X 2)  CULTURE, BLOOD (ROUTINE X 2)  RESP PANEL BY RT-PCR (RSV, FLU A&B, COVID)  RVPGX2  LACTIC ACID, PLASMA  URINALYSIS, ROUTINE W REFLEX MICROSCOPIC    Notable for labs stable  EKG   EKG Interpretation Date/Time:  Thursday Jun 24 2023  11:45:48 EDT Ventricular Rate:  60 PR Interval:    QRS Duration:  123 QT Interval:  474 QTC Calculation: 474 R Axis:   -32  Text Interpretation: Atrial fibrillation Left bundle branch block Confirmed by Annita Kindle [phone]) on 06/24/2023 3:07:17 PM         Imaging Studies ordered: I ordered imaging studies including CTPE CTH CXR > CT PENDING I agree with the radiologist interpretation If any imaging was obtained with contrast I closely monitored patient for any possible adverse reaction a/w contrast administration in the emergency department   Medicines ordered and prescription drug management: Meds ordered this encounter  Medications   iohexol  (OMNIPAQUE ) 350 MG/ML injection 75 mL    -I have reviewed the patients home medicines and have made adjustments as needed   Consultations Obtained: na   Cardiac Monitoring: The patient was maintained on a cardiac monitor.  I personally viewed and interpreted the cardiac monitored which showed an underlying rhythm of: afib Continuous pulse oximetry interpreted by myself, 98% on 1L.    Social Determinants of Health:  Diagnosis or treatment significantly limited by social determinants of health: former smoker  Reevaluation: After the interventions noted above, I reevaluated the patient and found that they have improved  Co morbidities that complicate the patient evaluation  Past Medical History:  Diagnosis Date   Arthritis    gout   Asthma    has rescue inhaler   Cancer [facility])    prostate   Chronic kidney disease    Dialysis patient (HCC)    Left Arm Restriction   GERD (gastroesophageal reflux disease)    H/O peptic ulcer 1970's   Herpes 04/28/2020   Hyperlipidemia    Hypertension    Ureteral stone    Wears dentures    Wears glasses       Dispostion: Disposition decision including need for hospitalization was considered, and patient disposition pending at time of sign out.    Final Clinical Impression(s) /  ED Diagnoses Final diagnoses:  Weakness        Teddi Favors, DO 06/24/23 1556

## 2023-06-24 NOTE — ED Provider Notes (Signed)
 3:05 PM Assumed care of patient from off-going team. For more details, please see note from same day.  In brief, this is a 83 y.o. male on HD, diagnosed w/ PNA earlier this week, sxs worsening, unable to perform ADLs, but hasn't been able to get up for last week. Worsening difficulty breathing, decreased PO intake. No hypoxia, no fever.   Plan/Dispo at time of sign-out & ED Course since sign-out: [ ]  CTH [ ]  CT PE  BP (!) 167/71   Pulse (!) 50   Temp 97.7 F (36.5 C) (Axillary)   Resp (!) 23   Ht 5\' 9"  (1.753 m)   Wt 95.3 kg   SpO2 100%   BMI 31.01 kg/m    ED Course:   Clinical Course as of 06/24/23 1856  Thu Jun 24, 2023  1320 Spoke w/ spouse at bedside, reports pt had prior stroke with right sided weakness residual that is unchanged today. Right leg swelling is chronic and perhaps worsened over the last week or so but note acutely changed. Last HD was Tuesday, no missed sessions recently  [SG]  1428 Pulse Rate(!): 50 Slow afib [SG]  1538 CT Head Wo Contrast 1. No evidence of acute intracranial abnormality. 2. Chronic left PCA infarct. 3. Moderate chronic small vessel ischemic disease.   [HN]  1705 CT Angio Chest PE W and/or Wo Contrast 1. Right lower lobe segmental and subsegmental pulmonary emboli. No evidence for right heart strain. 2. Small right pleural effusion. 3. Mild peripheral patchy ground-glass and airspace opacities in the inferior right lower lobe may be atelectasis or infarct. 4. Right lower lobe peribronchial wall thickening. 5. Acute nondisplaced posterior left eleventh rib fracture. Subacute posterior right eleventh rib fracture. 6. Age indeterminate compression deformities of T7 and T12. 7. Multiple bilateral renal cysts measuring up to 8.8 cm. These can be further evaluated with renal ultrasound. 8. Aortic atherosclerosis.   [HN]  1730 Urinalysis, Routine w reflex microscopic -Urine, Clean Catch(!) +UTI as well, will give ceftriaxone [HN]     Clinical Course User Index [HN] Merdis Stalling, MD [SG] Teddi Favors, DO    Dispo: Admitted to hospitalist. Made comfort care by hospitalist. ------------------------------- Annita Kindle, MD Emergency Medicine  This note was created using dictation software, which may contain spelling or grammatical errors.   CRITICAL CARE Performed by: Merdis Stalling Total critical care time: 30 minutes Critical care time was exclusive of separately billable procedures and treating other patients. Critical care was necessary to treat or prevent imminent or life-threatening deterioration. Critical care was time spent personally by me on the following activities: development of treatment plan with patient and/or surrogate as well as nursing, discussions with consultants, evaluation of patient's response to treatment, examination of patient, obtaining history from patient or surrogate, ordering and performing treatments and interventions, ordering and review of laboratory studies, ordering and review of radiographic studies, pulse oximetry and re-evaluation of patient's condition.     Merdis Stalling, MD 06/24/23 [phone]

## 2023-06-24 NOTE — ED Notes (Signed)
 Per MD hold off on sepsis fluid & ABX order set. Wait and see labs. Wife at bedside.

## 2023-06-24 NOTE — ED Triage Notes (Signed)
 BIBA [facility], dx with pna on Monday. Hx of HD (T TH S) Endorses having HD on Tuesday. Patient bruised and multiple skin tears. Appears to favor R side, dependent edema to R leg and weakness to R side.

## 2023-06-24 NOTE — Progress Notes (Addendum)
 Wife at bedside endorses pt has not walked in the last week. Poor po & decline since PNA diagnosis. Prior was able to ambulate with walker. Pt lives with wife & son. Hx of old stroke per wife.

## 2023-06-24 NOTE — H&P (Signed)
 [facility] H&P   Patient Demographics:    Taylor Willis, is a 83 y.o. male  MRN: [id number]   DOB - [birth year]  Admit Date - 06/24/2023  Outpatient Primary MD for the patient is Lauran Pollard, MD  Referring MD/NP/PA: Dr Drury Geralds  Patient coming from: home  Chief Complaint  Patient presents with   Shortness of Breath      HPI:    Taylor Willis  is a 83 y.o. 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, GERD, HTN, gout, and CVA, A-fib, on Eliquis , hypertension, prostate cancer, patient presents to ED, given increased weakness, deconditioning, increased confusion and lethargy, fall, cough, weakness, unable to go for his dialysis today, wife at bedside provides most of the history given his unresponsiveness, patient was seen couple times already this week by his PCP, where they were told he has overall poor prognosis, he had a fall from the bed earlier in the week as well, has not been eating, drinking, so obtunded this morning could not go for dialysis so family brought him here for further evaluation. - In ED patient was noted to be obtundent, minimally responsive, tachypneic, tachycardic, poorly responsive, workup significant for a new PE, pneumonia and pleural effusion, he is heart rate in the 40s to 50s, shallow breathing, tachypneic appears to be at end-of-life, I have discussed with family goals of care, reports they have been told this by his primary care doctor, at this point they do not wish to prolong his footing given recently significant decline in mental and functional status and poor life quality, decision has been made to proceed for comfort measures.    Review of systems:    Unable to obtain given his encephalopathy  With Past History of the following :    Past Medical History:  Diagnosis Date   Arthritis    gout   Asthma    has rescue inhaler   Cancer  [facility])    prostate   Chronic kidney disease    Dialysis patient [facility])    Left Arm Restriction   GERD (gastroesophageal reflux disease)    H/O peptic ulcer 1970's   Herpes 04/28/2020   Hyperlipidemia    Hypertension    Ureteral stone    Wears dentures    Wears glasses       Past Surgical History:  Procedure Laterality Date   A/V FISTULAGRAM Left 10/09/2022   Procedure: A/V Fistulagram;  Surgeon: Carlene Che, MD;  Location: [facility];  Service: Cardiovascular;  Laterality: Left;   A/V FISTULAGRAM N/A 02/26/2023   Procedure: A/V Fistulagram;  Surgeon: Jackquelyn Mass, MD;  Location: [facility] INV[facility];  Service: Cardiovascular;  Laterality: N/A;   A/V FISTULAGRAM N/A 05/21/2023   Procedure: A/V Fistulagram;  Surgeon: Carlene Che, MD;  Location: [facility];  Service: Cardiovascular;  Laterality: N/A;   AV FISTULA PLACEMENT Left 02/15/2019   Procedure: ARTERIOVENOUS (AV) FISTULA CREATION LEFT ARM;  Surgeon: Mayo Speck, MD;  Location: [facility] OR;  Service: Vascular;  Laterality: Left;   CATARACT EXTRACTION W/ INTRAOCULAR LENS  IMPLANT, BILATERAL     CYSTOSCOPY/RETROGRADE/URETEROSCOPY/STONE EXTRACTION WITH BASKET Right 05/23/2012   Procedure: CYSTOSCOPY/RETROGRADE/URETEROSCOPY/STONE EXTRACTION WITH BASKET;  Surgeon: Livingston Rigg, MD;  [facility];  Service: Urology;  Laterality: Right;   DIALYSIS/PERMA CATHETER INSERTION N/A 02/26/2023   Procedure: DIALYSIS/PERMA CATHETER INSERTION;  Surgeon: Jackquelyn Mass, MD;  Location: [facility] IN[facility];  Service: Cardiovascular;  Laterality: N/A;   FRACTURE SURGERY Right    as a child   HERNIA REPAIR     HOLMIUM LASER APPLICATION Right 05/23/2012   Procedure: HOLMIUM LASER APPLICATION;  Surgeon: Livingston Rigg, MD;  Location: [facility];  Service: Urology;  Laterality: Right;   INSERTION OF DIALYSIS CATHETER Right 04/08/2020   Procedure: INSERTION OF TUNNELED DIALYSIS CATHETER;   Surgeon: Awilda Bogus, MD;  Location: [facility];  Service: General;  Laterality: Right;   IR FLUORO GUIDE CV LINE RIGHT  04/16/2023   LIGATION OF ARTERIOVENOUS  FISTULA Left 09/12/2019   Procedure: SIDE BRANCH LIGATION  OF LEFT ARM ARTERIOVENOUS FISTULA;  Surgeon: Richrd Char, MD;  Location: [facility] OR;  Service: Vascular;  Laterality: Left;   MULTIPLE TOOTH EXTRACTIONS     PERIPHERAL VASCULAR BALLOON ANGIOPLASTY  05/22/2020   Procedure: PERIPHERAL VASCULAR BALLOON ANGIOPLASTY;  Surgeon: Carlene Che, MD;  Location: [facility];  Service: Cardiovascular;;  Left arm fistula   PERIPHERAL VASCULAR BALLOON ANGIOPLASTY Left 10/09/2022   Procedure: PERIPHERAL VASCULAR BALLOON ANGIOPLASTY;  Surgeon: Carlene Che, MD;  Location: [facility];  Service: Cardiovascular;  Laterality: Left;   REMOVAL OF A DIALYSIS CATHETER Right 04/18/2023   Procedure: REMOVAL, DIALYSIS CATHETER and Repair of Right Jugular;  Surgeon: Carlene Che, MD;  Location[facility];  Service: Vascular;  Laterality: Right;   REVISON OF ARTERIOVENOUS FISTULA Left 09/12/2019   Procedure: REVISON OF LEFT ARM ARTERIOVENOUS FISTULA;  Surgeon: Richrd Char, MD;  Location: [facility];  Service: Vascular;  Laterality: Left;   ROBOT ASSISTED LAPAROSCOPIC RADICAL PROSTATECTOMY  2011   TONSILLECTOMY        Social History:     Social History   Tobacco Use   Smoking status: Former    Current packs/day: 0.00    Types: Cigarettes    Quit date: 05/18/1982    Years since quitting: 41.1   Smokeless tobacco: Never  Substance Use Topics   Alcohol  use: Not Currently    Alcohol /week: 1.0 standard drink of alcohol     Types: 1 Standard drinks or equivalent per week       Family History :   Family history was reviewed, nonpertinent   Home Medications:   Prior to Admission medications   Medication Sig Start Date End Date Taking? Authorizing Provider  acetaminophen  (TYLENOL ) 500 MG tablet Take 500 mg by mouth every 6  (six) hours as needed for moderate pain.    [provider]  albuterol  (PROVENTIL  HFA;VENTOLIN  HFA) 108 (90 BASE) MCG/ACT inhaler Inhale 2 puffs into the lungs every 6 (six) hours as needed (Asthma).    [provider]  allopurinol  (ZYLOPRIM ) 100 MG tablet Take 100 mg by mouth at bedtime. 12/04/19   [provider]  apixaban  (ELIQUIS ) 2.5 MG TABS tablet Take 1 tablet (2.5 mg  total) by mouth See admin instructions. Take 2.5 mg Tues., Thurs.,and Sat, in the evening Take 2.5 mg on Monday, Wednesday, Friday and Sunday twice a day Patient not taking: Reported on 04/18/2023 02/27/23   Aisha Hove, MD  apixaban  (ELIQUIS ) 5 MG TABS tablet Take 2.5 mg by mouth See admin instructions. Take 2.5 mg by mouth on Tues., Thurs.,and Sat, in the evening @2000  and 2.5 mg rest of days during the morning only @0930  per spouse    [provider]  B Complex-C-Folic Acid  (TRIPHROCAPS) 1 MG CAPS Take 1 capsule by mouth daily.    [provider]  calcitRIOL  (ROCALTROL ) 0.25 MCG capsule Take 0.25 mcg by mouth Every Tuesday,Thursday,and Saturday with dialysis.    [provider]  carboxymethylcellulose 1 % ophthalmic solution Place 1 drop into both eyes daily as needed (Dry eyes).    [provider]  carvedilol  (COREG ) 3.125 MG tablet Take 1 tablet (3.125 mg total) by mouth See admin instructions. Take 1 tablet in the evenings on Dialysis days (Tues, Thurs, and Sat), On Non-dialysis days take 1 tablet twice a day. Patient taking differently: Take 3.125 mg by mouth every evening. 04/29/20   Colin Dawley, MD  cefdinir (OMNICEF) 300 MG capsule Take 300 mg by mouth 2 (two) times daily. 04/13/23   [provider]  furosemide  (LASIX ) 40 MG tablet Take 120 mg by mouth daily. 07/17/21   [provider]  multivitamin (RENA-VIT) TABS tablet Take 1 tablet by mouth at bedtime. 07/25/21   [provider]  rosuvastatin  (CRESTOR ) 5 MG tablet Take 5  mg by mouth at bedtime.    [provider]  sertraline  (ZOLOFT ) 25 MG tablet Take 25 mg by mouth daily. 07/29/21   [provider]  sevelamer  carbonate (RENVELA ) 2.4 g PACK Take 2.4 g by mouth 3 (three) times daily with meals.    [provider]  traZODone  (DESYREL ) 50 MG tablet Take 25 mg by mouth at bedtime.    [provider]  vitamin B-12 (CYANOCOBALAMIN ) 1000 MCG tablet Take 1,000 mcg by mouth daily.    [provider]     Allergies:     Allergies  Allergen Reactions   Aspirin Other (See Comments)    GI irritation    Hydrochlorothiazide     Dizziness and unable to walk   Thiazide-Type Diuretics Other (See Comments)    Dizziness and unable to walk   Ibuprofen Other (See Comments)    GI irritation      Physical Exam:   Vitals  Blood pressure (!) 166/65, pulse (!) 52, temperature 97.7 F (36.5 C), temperature source Axillary, resp. rate 19, height 5\' 9"  (1.753 m), weight 95.3 kg, SpO2 100%.   1. General Ill, chronically ill-appearing, deconditioned  2.  Jent, does not follow any commands or answer any questions   4.Conjunctivae clear, dry oral mucosa  5. Supple Neck,  No cervical lymphadenopathy appriciated, No Carotid Bruits.  6. Symmetrical Chest wall movement, diminished air entry bilaterally, shallow breathing, tachypneic.  7.  Bradycardic, irregular  8. Positive Bowel Sounds, Abdomen Soft, avoid  9.  No Cyanosis, significant skeletal bruising and ecchymosis   Data Review:    CBC Recent Labs  Lab 06/24/23 1214  WBC 5.9  HGB 12.0*  HCT 38.9*  PLT 58*  MCV 106.0*  MCH 32.7  MCHC 30.8  RDW 16.0*  LYMPHSABS 1.2  MONOABS 0.5  EOSABS 0.0  BASOSABS 0.0   ------------------------------------------------------------------------------------------------------------------  Chemistries  Recent Labs  Lab 06/24/23 1214  NA 134*  K 3.7  CL 94*  CO2 26  GLUCOSE 89  BUN 37*  CREATININE 5.00*  CALCIUM  9.4   AST 92*  ALT 94*  ALKPHOS 90  BILITOT 1.1   ------------------------------------------------------------------------------------------------------------------ estimated creatinine clearance is 13 mL/min (A) (by C-G formula based on SCr of 5 mg/dL (H)). ------------------------------------------------------------------------------------------------------------------ No results for input(s): "TSH", "T4TOTAL", "T3FREE", "THYROIDAB" in the last 72 hours.  Invalid input(s): "FREET3"  Coagulation profile Recent Labs  Lab 06/24/23 1214  INR 2.1*   ------------------------------------------------------------------------------------------------------------------- No results for input(s): "DDIMER" in the last 72 hours. -------------------------------------------------------------------------------------------------------------------  Cardiac Enzymes No results for input(s): "CKMB", "TROPONINI", "MYOGLOBIN" in the last 168 hours.  Invalid input(s): "CK" ------------------------------------------------------------------------------------------------------------------ No results found for: "BNP"   ---------------------------------------------------------------------------------------------------------------  Urinalysis    Component Value Date/Time   COLORURINE AMBER (A) 06/24/2023 1642   APPEARANCEUR TURBID (A) 06/24/2023 1642   LABSPEC 1.019 06/24/2023 1642   PHURINE 5.0 06/24/2023 1642   GLUCOSEU NEGATIVE 06/24/2023 1642   HGBUR MODERATE (A) 06/24/2023 1642   BILIRUBINUR NEGATIVE 06/24/2023 1642   KETONESUR NEGATIVE 06/24/2023 1642   PROTEINUR 100 (A) 06/24/2023 1642   NITRITE NEGATIVE 06/24/2023 1642   LEUKOCYTESUR LARGE (A) 06/24/2023 1642    ----------------------------------------------------------------------------------------------------------------   Imaging Results:    CT Angio Chest PE W and/or Wo Contrast Result Date: 06/24/2023 CLINICAL DATA:  High probability  for PE.  Shortness of breath. EXAM: CT ANGIOGRAPHY CHEST WITH CONTRAST TECHNIQUE: Multidetector CT imaging of the chest was performed using the standard protocol during bolus administration of intravenous contrast. Multiplanar CT image reconstructions and MIPs were obtained to evaluate the vascular anatomy. RADIATION DOSE REDUCTION: This exam was performed according to the departmental dose-optimization program which includes automated exposure control, adjustment of the mA and/or kV according to patient size and/or use of iterative reconstruction technique. CONTRAST:  75mL OMNIPAQUE  IOHEXOL  350 MG/ML SOLN COMPARISON:  Report only CT of the chest 03/31/2020. FINDINGS: Cardiovascular: There are segmental and subsegmental right lower lobe pulmonary emboli. Heart is mildly enlarged. No pericardial effusion. Aorta is normal in size. There are atherosclerotic calcifications of the aorta and coronary arteries. Mediastinum/Nodes: No enlarged mediastinal, hilar, or axillary lymph nodes. Thyroid gland, trachea, and esophagus demonstrate no significant findings. Lungs/Pleura: There is a small right pleural effusion. There is some mild peripheral patchy ground-glass and airspace opacities in the inferior right lower lobe. There is scattered apical pulmonary nodules measuring 4 mm or less. There is right lower lobe peribronchial wall thickening diffusely. Upper Abdomen: There are numerous bilateral renal cysts which are partially visualized measuring up to 8.8 cm. Many of these have thin peripheral calcifications. Musculoskeletal: There is an acute nondisplaced posterior left eleventh rib fracture. There are is a subacute posterior right eleventh rib fracture. There are additional healed fractures bilaterally. There is mild compression deformity of T7 there is severe compression deformity of T12. These are age indeterminate, but favored as chronic. Review of the MIP images confirms the above findings. IMPRESSION: 1. Right lower  lobe segmental and subsegmental pulmonary emboli. No evidence for right heart strain. 2. Small right pleural effusion. 3. Mild peripheral patchy ground-glass and airspace opacities in the inferior right lower lobe may be atelectasis or infarct. 4. Right lower lobe peribronchial wall thickening. 5. Acute nondisplaced posterior left eleventh rib fracture. Subacute posterior right eleventh rib fracture. 6. Age indeterminate compression deformities of T7 and T12. 7. Multiple bilateral renal cysts measuring up to 8.8 cm. These can be further evaluated with renal  ultrasound. 8. Aortic atherosclerosis. Aortic Atherosclerosis (ICD10-I70.0). Electronically Signed   By: Tyron Gallon M.D.   On: 06/24/2023 16:57   CT Head Wo Contrast Result Date: 06/24/2023 CLINICAL DATA:  Delirium. EXAM: CT HEAD WITHOUT CONTRAST TECHNIQUE: Contiguous axial images were obtained from the base of the skull through the vertex without intravenous contrast. RADIATION DOSE REDUCTION: This exam was performed according to the departmental dose-optimization program which includes automated exposure control, adjustment of the mA and/or kV according to patient size and/or use of iterative reconstruction technique. COMPARISON:  Head CT 07/07/2020 and MRI 07/05/2020 FINDINGS: The study is mildly to moderately motion degraded. Brain: There is no evidence of an acute infarct, intracranial hemorrhage, mass, midline shift, or extra-axial fluid collection. A chronic left PCA infarct was acute in 2022. There is ex vacuo dilatation of the temporal horn of the left lateral ventricle. Patchy hypodensities elsewhere in the cerebral white matter bilaterally are nonspecific but compatible with moderate chronic small vessel ischemic disease. Mild global cerebral atrophy is within normal limits for age. Vascular: Calcified atherosclerosis at the skull base. No hyperdense vessel. Skull: No fracture or suspicious lesion. Sinuses/Orbits: Mild mucosal thickening in the  ethmoid sinuses. Clear mastoid air cells. Bilateral cataract extraction. Other: None. IMPRESSION: 1. No evidence of acute intracranial abnormality. 2. Chronic left PCA infarct. 3. Moderate chronic small vessel ischemic disease. Electronically Signed   By: Aundra Lee M.D.   On: 06/24/2023 15:25   DG Chest Port 1 View Result Date: 06/24/2023 CLINICAL DATA:  Sepsis, shortness of breath. EXAM: PORTABLE CHEST 1 VIEW COMPARISON:  Jun 21, 2023. FINDINGS: Stable cardiomediastinal silhouette with central pulmonary vascular congestion. Hypoinflation of the lungs is noted. No definite consolidative process is noted. Bony thorax is unremarkable. IMPRESSION: Mild central pulmonary vascular congestion with hypoinflation of the lungs. Electronically Signed   By: Rosalene Colon M.D.   On: 06/24/2023 14:02       Assessment & Plan:       Acute metabolic encephalopathy   ESRD (end stage renal disease) (HCC)   Acute pulmonary embolism (HCC)   End of life care   Fall   Rib fracture   FTT (failure to thrive) in adult   UTI   Anemia in chronic kidney disease   Personal history of malignant neoplasm of prostate   Acute respiratory failure with hypoxia (HCC)   History of atrial fibrillation   Thrombocytopenia (HCC)    Benign essential hypertension  With progressive decline recently, but rapid decline over the last 2 days, currently minimally responsive, with shallow breathing, bradycardic, scaphoid, significant failure to thrive, with fall, rib fracture, workup significant for PE despite being on anticoagulation, as discussed with the family, they already started this discussion with his primary care doctor, who warned them that approaching end-of-life, we discussed care, they understand at this point best approach is just to focus on comfort , will be admitted for comfort measures he is obtunded unable to tolerate any oral regimen, will consult palliative medicine, I do anticipate this during hospital stay  versus residential hospice    DVT Prophylaxis none further care  AM Labs Ordered, also please review Full Orders  Family Communication: Admission, patients condition and plan of care including tests being ordered have been discussed with the wife and son at bedside who indicate understanding and agree with the plan and Code Status.  Code Status DNR comfort  Likely DC to hospital death versus residential hospice  Condition comfort  Consults called: Requested palliative  consult in epic  Admission status: Inpatient  Time spent in minutes : 55 minutes   Seena Dadds M.D on 06/24/2023 at 7:43 PM   [facility] - Office  [phone]

## 2023-06-25 ENCOUNTER — Other Ambulatory Visit: Payer: Self-pay

## 2023-06-25 ENCOUNTER — Encounter (HOSPITAL_COMMUNITY): Payer: Self-pay | Admitting: Internal Medicine

## 2023-06-25 DIAGNOSIS — J9601 Acute respiratory failure with hypoxia: Secondary | ICD-10-CM | POA: Diagnosis not present

## 2023-06-25 DIAGNOSIS — N186 End stage renal disease: Secondary | ICD-10-CM | POA: Diagnosis not present

## 2023-06-25 DIAGNOSIS — I2699 Other pulmonary embolism without acute cor pulmonale: Secondary | ICD-10-CM

## 2023-06-25 DIAGNOSIS — G9341 Metabolic encephalopathy: Secondary | ICD-10-CM | POA: Diagnosis not present

## 2023-06-25 NOTE — Progress Notes (Signed)
 PROGRESS NOTE   Taylor Willis  HQI:[id number] DOB: 16-Dec-1940 DOA: 06/24/2023 PCP: Lauran Pollard, MD   Chief Complaint  Patient presents with   Shortness of Breath   Level of care: Telemetry  Brief Admission History:   83 y.o. 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, GERD, HTN, gout, and CVA, A-fib, on Eliquis , hypertension, prostate cancer, patient presents to ED, given increased weakness, deconditioning, increased confusion and lethargy, fall, cough, weakness, unable to go for his dialysis today, wife at bedside provides most of the history given his unresponsiveness, patient was seen couple times already this week by his PCP, where they were told he has overall poor prognosis, he had a fall from the bed earlier in the week as well, has not been eating, drinking, so obtunded this morning could not go for dialysis so family brought him here for further evaluation. - In ED patient was noted to be obtundent, minimally responsive, tachypneic, tachycardic, poorly responsive, workup significant for a new PE, pneumonia and pleural effusion, he is heart rate in the 40s to 50s, shallow breathing, tachypneic appears to be at end-of-life, I have discussed with family goals of care, reports they have been told this by his primary care doctor, at this point they do not wish to prolong his footing given recently significant decline in mental and functional status and poor life quality, decision has been made to proceed for comfort measures.   Assessment and Plan: Principal Problem:   Acute metabolic encephalopathy Active Problems:   Acute respiratory failure with hypoxia (HCC)   ESRD (end stage renal disease) (HCC)   Anemia in chronic kidney disease   Benign essential hypertension   Personal history of malignant neoplasm of prostate   History of UTI   History of atrial fibrillation   Acute pulmonary embolism (HCC)   End of life care   Fall   Rib fracture   FTT (failure  to thrive) in adult   Thrombocytopenia (HCC)  Pt has been admitted for full comfort measures.  I had discussion with wife about transition to residential hospice and she requested more time to meet with other family members to make a decision.  Pt likely has days to week. Continue full comfort care orders.   DVT prophylaxis: full comfort care  Code Status: DNR  Family Communication: wife at bedside  Disposition: TBD    Consultants:   Procedures:   Antimicrobials:    Subjective: Pt intermittently has been interactive with family.   Objective: Vitals:   06/24/23 1255 06/24/23 1415 06/24/23 1730 06/24/23 2042  BP: (!) 174/96 (!) 167/71 (!) 166/65 (!) 169/75  Pulse: (!) 54 (!) 50 (!) 52 (!) 50  Resp: (!) 22 (!) 23 19 18   Temp:    (!) 97.5 F (36.4 C)  TempSrc:    Oral  SpO2: 100% 100% 100% 94%  Weight:      Height:        Intake/Output Summary (Last 24 hours) at 06/25/2023 1230 Last data filed at 06/25/2023 1324 Gross per 24 hour  Intake 160.06 ml  Output --  Net 160.06 ml   Filed Weights   06/24/23 1153  Weight: 95.3 kg   Examination:  General exam: Appears calm and comfortable he is somnolent, not very arousable today.   Respiratory system: no increased work of breathing.  Cardiovascular system: normal S1 & S2 heard. No JVD, murmurs, rubs, gallops or clicks. No pedal edema. Gastrointestinal system: Abdomen  is nondistended, soft and nontender. No organomegaly or masses felt. Normal bowel sounds heard. Central nervous system: somnolent. No focal neurological deficits. Extremities: Symmetric 5 x 5 power. Skin: No gross lesions seen. Psychiatry: UTD.   Data Reviewed: I have personally reviewed following labs and imaging studies  CBC: Recent Labs  Lab 06/24/23 1214  WBC 5.9  NEUTROABS 4.2  HGB 12.0*  HCT 38.9*  MCV 106.0*  PLT 58*    Basic Metabolic Panel: Recent Labs  Lab 06/24/23 1214  NA 134*  K 3.7  CL 94*  CO2 26  GLUCOSE 89  BUN 37*   CREATININE 5.00*  CALCIUM  9.4    CBG: No results for input(s): "GLUCAP" in the last 168 hours.  Recent Results (from the past 240 hours)  Blood Culture (routine x 2)     Status: None (Preliminary result)   Collection Time: 06/24/23 12:14 PM   Specimen: BLOOD  Result Value Ref Range Status   Specimen Description BLOOD BLOOD RIGHT FOREARM  Final   Special Requests   Final    BOTTLES DRAWN AEROBIC ONLY Blood Culture results may not be optimal due to an inadequate volume of blood received in culture bottles   Culture   Final    NO GROWTH < 24 HOURS Performed at [facility], [address] [place], [place] [address]    Report Status PENDING  Incomplete  Blood Culture (routine x 2)     Status: None (Preliminary result)   Collection Time: 06/24/23 12:30 PM   Specimen: BLOOD  Result Value Ref Range Status   Specimen Description BLOOD RIGHT ANTECUBITAL  Final   Special Requests   Final    BOTTLES DRAWN AEROBIC ONLY Blood Culture results may not be optimal due to an inadequate volume of blood received in culture bottles   Culture   Final    NO GROWTH < 24 HOURS Performed at [facility], [address] [place], [place] [address]    Report Status PENDING  Incomplete  Resp panel by RT-PCR (RSV, Flu A&B, Covid) Anterior Nasal Swab     Status: None   Collection Time: 06/24/23  1:27 PM   Specimen: Anterior Nasal Swab  Result Value Ref Range Status   SARS Coronavirus 2 by RT PCR NEGATIVE NEGATIVE Final    Comment: (NOTE) SARS-CoV-2 target nucleic acids are NOT DETECTED.  The SARS-CoV-2 RNA is generally detectable in upper respiratory specimens during the acute phase of infection. The lowest concentration of SARS-CoV-2 viral copies this assay can detect is 138 copies/mL. A negative result does not preclude SARS-Cov-2 infection and should not be used as the sole basis for treatment or other patient management decisions. A negative result may occur with  improper specimen  collection/handling, submission of specimen other than nasopharyngeal swab, presence of viral mutation(s) within the areas targeted by this assay, and inadequate number of viral copies(<138 copies/mL). A negative result must be combined with clinical observations, patient history, and epidemiological information. The expected result is Negative.  Fact Sheet for Patients:  BloggerCourse.com  Fact Sheet for Healthcare Providers:  SeriousBroker.it  This test is no t yet approved or cleared by the [place]  FDA and  has been authorized for detection and/or diagnosis of SARS-CoV-2 by FDA under an Emergency Use Authorization (EUA). This EUA will remain  in effect (meaning this test can be used) for the duration of the COVID-19 declaration under Section 564(b)(1) of the Act, 21 U.S.C.section 360bbb-3(b)(1), unless the authorization is terminated  or  revoked sooner.       Influenza A by PCR NEGATIVE NEGATIVE Final   Influenza B by PCR NEGATIVE NEGATIVE Final    Comment: (NOTE) The Xpert Xpress SARS-CoV-2/FLU/RSV plus assay is intended as an aid in the diagnosis of influenza from Nasopharyngeal swab specimens and should not be used as a sole basis for treatment. Nasal washings and aspirates are unacceptable for Xpert Xpress SARS-CoV-2/FLU/RSV testing.  Fact Sheet for Patients: BloggerCourse.com  Fact Sheet for Healthcare Providers: SeriousBroker.it  This test is not yet approved or cleared by the [place]  FDA and has been authorized for detection and/or diagnosis of SARS-CoV-2 by FDA under an Emergency Use Authorization (EUA). This EUA will remain in effect (meaning this test can be used) for the duration of the COVID-19 declaration under Section 564(b)(1) of the Act, 21 U.S.C. section 360bbb-3(b)(1), unless the authorization is terminated or revoked.     Resp Syncytial  Virus by PCR NEGATIVE NEGATIVE Final    Comment: (NOTE) Fact Sheet for Patients: BloggerCourse.com  Fact Sheet for Healthcare Providers: SeriousBroker.it  This test is not yet approved or cleared by the [place]  FDA and has been authorized for detection and/or diagnosis of SARS-CoV-2 by FDA under an Emergency Use Authorization (EUA). This EUA will remain in effect (meaning this test can be used) for the duration of the COVID-19 declaration under Section 564(b)(1) of the Act, 21 U.S.C. section 360bbb-3(b)(1), unless the authorization is terminated or revoked.  Performed at [facility], [address] [place], [place] [address]      Radiology Studies: CT Angio Chest PE W and/or Wo Contrast Addendum Date: 06/24/2023 ADDENDUM REPORT: 06/24/2023 20:21 ADDENDUM: These results were called by telephone at the time of interpretation on 06/24/2023 at 5:04 Pm to provider Dr. Georgie Kiss, who verbally acknowledged these results. Electronically Signed   By: Tyron Gallon M.D.   On: 06/24/2023 20:21   Result Date: 06/24/2023 CLINICAL DATA:  High probability for PE.  Shortness of breath. EXAM: CT ANGIOGRAPHY CHEST WITH CONTRAST TECHNIQUE: Multidetector CT imaging of the chest was performed using the standard protocol during bolus administration of intravenous contrast. Multiplanar CT image reconstructions and MIPs were obtained to evaluate the vascular anatomy. RADIATION DOSE REDUCTION: This exam was performed according to the departmental dose-optimization program which includes automated exposure control, adjustment of the mA and/or kV according to patient size and/or use of iterative reconstruction technique. CONTRAST:  75mL OMNIPAQUE  IOHEXOL  350 MG/ML SOLN COMPARISON:  Report only CT of the chest 03/31/2020. FINDINGS: Cardiovascular: There are segmental and subsegmental right lower lobe pulmonary emboli. Heart is mildly enlarged. No pericardial effusion.  Aorta is normal in size. There are atherosclerotic calcifications of the aorta and coronary arteries. Mediastinum/Nodes: No enlarged mediastinal, hilar, or axillary lymph nodes. Thyroid gland, trachea, and esophagus demonstrate no significant findings. Lungs/Pleura: There is a small right pleural effusion. There is some mild peripheral patchy ground-glass and airspace opacities in the inferior right lower lobe. There is scattered apical pulmonary nodules measuring 4 mm or less. There is right lower lobe peribronchial wall thickening diffusely. Upper Abdomen: There are numerous bilateral renal cysts which are partially visualized measuring up to 8.8 cm. Many of these have thin peripheral calcifications. Musculoskeletal: There is an acute nondisplaced posterior left eleventh rib fracture. There are is a subacute posterior right eleventh rib fracture. There are additional healed fractures bilaterally. There is mild compression deformity of T7 there is severe compression deformity of T12. These are age indeterminate, but favored as chronic. Review  of the MIP images confirms the above findings. IMPRESSION: 1. Right lower lobe segmental and subsegmental pulmonary emboli. No evidence for right heart strain. 2. Small right pleural effusion. 3. Mild peripheral patchy ground-glass and airspace opacities in the inferior right lower lobe may be atelectasis or infarct. 4. Right lower lobe peribronchial wall thickening. 5. Acute nondisplaced posterior left eleventh rib fracture. Subacute posterior right eleventh rib fracture. 6. Age indeterminate compression deformities of T7 and T12. 7. Multiple bilateral renal cysts measuring up to 8.8 cm. These can be further evaluated with renal ultrasound. 8. Aortic atherosclerosis. Aortic Atherosclerosis (ICD10-I70.0). Electronically Signed: By: Tyron Gallon M.D. On: 06/24/2023 16:57   CT Head Wo Contrast Result Date: 06/24/2023 CLINICAL DATA:  Delirium. EXAM: CT HEAD WITHOUT CONTRAST  TECHNIQUE: Contiguous axial images were obtained from the base of the skull through the vertex without intravenous contrast. RADIATION DOSE REDUCTION: This exam was performed according to the departmental dose-optimization program which includes automated exposure control, adjustment of the mA and/or kV according to patient size and/or use of iterative reconstruction technique. COMPARISON:  Head CT 07/07/2020 and MRI 07/05/2020 FINDINGS: The study is mildly to moderately motion degraded. Brain: There is no evidence of an acute infarct, intracranial hemorrhage, mass, midline shift, or extra-axial fluid collection. A chronic left PCA infarct was acute in 2022. There is ex vacuo dilatation of the temporal horn of the left lateral ventricle. Patchy hypodensities elsewhere in the cerebral white matter bilaterally are nonspecific but compatible with moderate chronic small vessel ischemic disease. Mild global cerebral atrophy is within normal limits for age. Vascular: Calcified atherosclerosis at the skull base. No hyperdense vessel. Skull: No fracture or suspicious lesion. Sinuses/Orbits: Mild mucosal thickening in the ethmoid sinuses. Clear mastoid air cells. Bilateral cataract extraction. Other: None. IMPRESSION: 1. No evidence of acute intracranial abnormality. 2. Chronic left PCA infarct. 3. Moderate chronic small vessel ischemic disease. Electronically Signed   By: Aundra Lee M.D.   On: 06/24/2023 15:25   DG Chest Port 1 View Result Date: 06/24/2023 CLINICAL DATA:  Sepsis, shortness of breath. EXAM: PORTABLE CHEST 1 VIEW COMPARISON:  Jun 21, 2023. FINDINGS: Stable cardiomediastinal silhouette with central pulmonary vascular congestion. Hypoinflation of the lungs is noted. No definite consolidative process is noted. Bony thorax is unremarkable. IMPRESSION: Mild central pulmonary vascular congestion with hypoinflation of the lungs. Electronically Signed   By: Rosalene Colon M.D.   On: 06/24/2023 14:02    Scheduled Meds: Continuous Infusions:   LOS: 1 day   Time spent: 54 mins  Raheim Beutler Lincoln Renshaw, MD How to contact the [facility] Attending or Consulting provider 7A - 7P or covering provider during after hours 7P -7A, for this patient?  Check the care team in [facility] and look for a) attending/consulting TRH provider listed and b) the TRH team listed Log into [email or web address] to find provider on call.  Locate the TRH provider you are looking for under [facility] and page to a number that you can be directly reached. If you still have difficulty reaching the provider, please page the [facility] (Director on Call) for the Hospitalists listed on amion for assistance.  06/25/2023, 12:30 PM

## 2023-06-25 NOTE — Hospital Course (Signed)
 83 y.o. 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, GERD, HTN, gout, and CVA, A-fib, on Eliquis , hypertension, prostate cancer, patient presents to ED, given increased weakness, deconditioning, increased confusion and lethargy, fall, cough, weakness, unable to go for his dialysis today, wife at bedside provides most of the history given his unresponsiveness, patient was seen couple times already this week by his PCP, where they were told he has overall poor prognosis, he had a fall from the bed earlier in the week as well, has not been eating, drinking, so obtunded this morning could not go for dialysis so family brought him here for further evaluation. - In ED patient was noted to be obtundent, minimally responsive, tachypneic, tachycardic, poorly responsive, workup significant for a new PE, pneumonia and pleural effusion, he is heart rate in the 40s to 50s, shallow breathing, tachypneic appears to be at end-of-life, I have discussed with family goals of care, reports they have been told this by his primary care doctor, at this point they do not wish to prolong his footing given recently significant decline in mental and functional status and poor life quality, decision has been made to proceed for comfort measures.

## 2023-06-25 NOTE — TOC Initial Note (Addendum)
 Transition of Care [facility]) - Initial/Assessment Note    Patient Details  Name: Taylor Willis MRN: [id number] Date of Birth: [birth year]  [facility] CM/SW Contact:    Grandville Lax, LCSWA Phone Number: 06/25/2023, 9:55 AM  Clinical Narrative:                 Pt is high risk for readmission. MD requested that CSW speak with pts wife about residential hospice. CSW spoke with pts wife and asked if referral can be made to hospice for review. Pts spouse states that she would like to speak with family when they arrive at 48 and then make decision. CSW explained that I will call back this afternoon to follow up. MD and RN updated. TOC to follow.   Addendum 1pm: CSW spoke with pts spouse who states they would like hospice referral made at this time to [facility]. CSW spoke with Fredrica Jeans at [facility] who states they will follow up with pts spouse and provide update to CSW when able. TOC to follow.    Barriers to Discharge: Continued Medical Work up   Patient Goals and CMS Choice Patient states their goals for this hospitalization and ongoing recovery are:: comfort care CMS Medicare.gov Compare Post Acute Care list provided to:: Patient Represenative (must comment) Choice offered to / list presented to : Spouse      Expected Discharge Plan and Services In-house Referral: Clinical Social Work Discharge Planning Services: CM Consult   Living arrangements for the past 2 months: Single Family Home                                      Prior Living Arrangements/Services Living arrangements for the past 2 months: Single Family Home Lives with:: Spouse Patient language and need for interpreter reviewed:: Yes Do you feel safe going back to the place where you live?: Yes      Need for Family Participation in Patient Care: Yes (Comment) Care giver support system in place?: Yes (comment)   Criminal Activity/Legal Involvement Pertinent to Current Situation/Hospitalization: No - Comment  as needed  Activities of Daily Living   ADL Screening (condition at time of admission) Independently performs ADLs?: No Does the patient have a NEW difficulty with bathing/dressing/toileting/self-feeding that is expected to last >3 days?: Yes (Initiates electronic notice to provider for possible OT consult) Does the patient have a NEW difficulty with getting in/out of bed, walking, or climbing stairs that is expected to last >3 days?: Yes (Initiates electronic notice to provider for possible PT consult) Does the patient have a NEW difficulty with communication that is expected to last >3 days?: Yes (Initiates electronic notice to provider for possible SLP consult) Is the patient deaf or have difficulty hearing?: No Does the patient have difficulty seeing, even when wearing glasses/contacts?: No Does the patient have difficulty concentrating, remembering, or making decisions?: No  Permission Sought/Granted                  Emotional Assessment Appearance:: Appears stated age Attitude/Demeanor/Rapport: Engaged Affect (typically observed): Accepting Orientation: : Oriented to Self, Oriented to Place, Oriented to  Time, Oriented to Situation Alcohol  / Substance Use: Not Applicable Psych Involvement: No (comment)  Admission diagnosis:  Weakness [R53.1] Cystitis [N30.90] Acute metabolic encephalopathy [G93.41] Closed fracture of multiple ribs of both sides, initial encounter [S22.43XA] Multiple subsegmental pulmonary emboli without acute cor pulmonale (HCC) [I26.94] Patient  Active Problem List   Diagnosis Date Noted   Acute pulmonary embolism (HCC) 06/24/2023   End of life care 06/24/2023   Fall 06/24/2023   Rib fracture 06/24/2023   FTT (failure to thrive) in adult 06/24/2023   Thrombocytopenia (HCC) 06/24/2023   Complication associated with dialysis catheter 04/18/2023   History of UTI 04/17/2023   History of atrial fibrillation 04/17/2023   Problem with dialysis access,  initial encounter (HCC) 02/25/2023   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   ESRD (end stage renal disease) (HCC) 04/10/2020   Acute metabolic encephalopathy 04/10/2020   Acute respiratory failure with hypoxia (HCC) 04/04/2020   Pulmonary edema 04/04/2020   GERD (gastroesophageal reflux disease) 04/04/2020   Prostate cancer (HCC) 04/04/2020   History of asthma 04/04/2020   CKD (chronic kidney disease) stage 5, GFR less than 15 ml/min (HCC) 10/12/2019   Anemia in chronic kidney disease 11/24/2018   Hyperkalemia 11/24/2018   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 [address] [place] - [address] [place] [address] Phone: [phone] Fax: [phone]     Social Drivers of Health (SDOH) Social History: SDOH Screenings   Food Insecurity: Patient Unable To Answer (06/24/2023)  Housing: Low Risk  (06/24/2023)  Transportation Needs: No Transportation Needs (06/24/2023)  Utilities: Not At Risk (06/24/2023)  Financial Resource Strain: Low Risk  (02/27/2022)   Received from [facility], [facility]  Physical Activity: Inactive (02/27/2022)   Received from [facility], [facility]  Social Connections: Socially Integrated (06/24/2023)  Stress: No Stress Concern Present (02/27/2022)   Received from [facility], [facility]  Tobacco Use: Medium Risk (06/11/2023)   Received from [facility]  Health Literacy: Medium Risk (02/27/2022)    Received from [facility], [facility]   SDOH Interventions:     Readmission Risk Interventions    06/25/2023    9:54 AM 04/19/2023    1:42 PM  Readmission Risk Prevention Plan  Transportation Screening Complete Complete  Medication Review (RN Care Manager) Complete Referral to Pharmacy  PCP or Specialist appointment within 3-5 days of discharge  Complete  HRI or Home Care Consult Complete Complete  SW Recovery Care/Counseling Consult Complete   Palliative Care Screening Complete Not Applicable  Skilled Nursing Facility Not Applicable Not Applicable

## 2023-06-25 NOTE — Plan of Care (Signed)
  Problem: Coping: Goal: Level of anxiety will decrease Outcome: Progressing   Problem: Elimination: Goal: Will not experience complications related to bowel motility Outcome: Progressing   Problem: Pain Managment: Goal: General experience of comfort will improve and/or be controlled Outcome: Progressing   Problem: Safety: Goal: Ability to remain free from injury will improve Outcome: Progressing

## 2023-06-25 NOTE — Plan of Care (Signed)

## 2023-06-26 DIAGNOSIS — G9341 Metabolic encephalopathy: Secondary | ICD-10-CM | POA: Diagnosis not present

## 2023-06-26 DIAGNOSIS — D696 Thrombocytopenia, unspecified: Secondary | ICD-10-CM

## 2023-06-26 DIAGNOSIS — R627 Adult failure to thrive: Secondary | ICD-10-CM

## 2023-06-26 DIAGNOSIS — I2699 Other pulmonary embolism without acute cor pulmonale: Secondary | ICD-10-CM | POA: Diagnosis not present

## 2023-06-29 LAB — CULTURE, BLOOD (ROUTINE X 2)
Culture: NO GROWTH
Culture: NO GROWTH

## 2023-07-04 NOTE — Plan of Care (Signed)

## 2023-07-04 NOTE — Death Summary Note (Signed)
 DEATH SUMMARY   Patient Details  Name: Taylor Willis MRN: [id number] DOB: 1941/01/31  Admission/Discharge Information   Admit Date:  2023/07/18  Date of Death: Date of Death: Jul 20, 2023  Time of Death: Time of Death: August 05, 1541  Length of Stay: 2  Referring Physician: Lauran Pollard, MD   Reason(s) for Hospitalization   83 y.o. 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, GERD, HTN, gout, and CVA, A-fib, on Eliquis , hypertension, prostate cancer, patient presents to ED, given increased weakness, deconditioning, increased confusion and lethargy, fall, cough, weakness, unable to go for his dialysis today, wife at bedside provides most of the history given his unresponsiveness, patient was seen couple times already this week by his PCP, where they were told he has overall poor prognosis, he had a fall from the bed earlier in the week as well, has not been eating, drinking, so obtunded this morning could not go for dialysis so family brought him here for further evaluation. - In ED patient was noted to be obtundent, minimally responsive, tachypneic, tachycardic, poorly responsive, workup significant for a new PE, pneumonia and pleural effusion, he is heart rate in the 40s to 50s, shallow breathing, tachypneic appears to be at end-of-life, I have discussed with family goals of care, reports they have been told this by his primary care doctor, at this point they do not wish to prolong his footing given recently significant decline in mental and functional status and poor life quality, decision has been made to proceed for comfort measures.  Diagnoses  Preliminary cause of death:  Acute Pulmonary Embolism  Secondary Diagnoses (including complications and co-morbidities):  Principal Problem:   Acute pulmonary embolism (HCC) Active Problems:   Acute respiratory failure with hypoxia (HCC)   ESRD (end stage renal disease) (HCC)   Acute metabolic encephalopathy   Anemia in chronic  kidney disease   Benign essential hypertension   Personal history of malignant neoplasm of prostate   History of UTI   History of atrial fibrillation   End of life care   Fall   Rib fracture   FTT (failure to thrive) in adult   Thrombocytopenia [facility])   Brief Hospital Course (including significant findings, care, treatment, and services provided and events leading to death)  Taylor Willis is an 83 y.o. 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, GERD, HTN, gout, and CVA, A-fib, on Eliquis , hypertension, prostate cancer, patient presents to ED, given increased weakness, deconditioning, increased confusion and lethargy, fall, cough, weakness, unable to go for his dialysis today, wife at bedside provides most of the history given his unresponsiveness, patient was seen couple times already this week by his PCP, where they were told he has overall poor prognosis, he had a fall from the bed earlier in the week as well, has not been eating, drinking, so obtunded this morning could not go for dialysis so family brought him here for further evaluation. - In ED patient was noted to be obtundent, minimally responsive, tachypneic, tachycardic, poorly responsive, workup significant for a new PE, pneumonia and pleural effusion, he is heart rate in the 40s to 50s, shallow breathing, tachypneic appears to be at end-of-life, I have discussed with family goals of care, reports they have been told this by his primary care doctor, at this point they do not wish to prolong his footing given recently significant decline in mental and functional status and poor life quality, decision has  been made to proceed with full comfort measures.  Pt expired in hospital on 06/21/2023.    Pertinent Labs and Studies  Significant Diagnostic Studies CT Angio Chest PE W and/or Wo Contrast Addendum Date: 06/24/2023 ADDENDUM REPORT: 06/24/2023 20:21 ADDENDUM: These results were called by telephone at the time of  interpretation on 06/24/2023 at 5:04 Pm to provider Dr. Georgie Kiss, who verbally acknowledged these results. Electronically Signed   By: Tyron Gallon M.D.   On: 06/24/2023 20:21   Result Date: 06/24/2023 CLINICAL DATA:  High probability for PE.  Shortness of breath. EXAM: CT ANGIOGRAPHY CHEST WITH CONTRAST TECHNIQUE: Multidetector CT imaging of the chest was performed using the standard protocol during bolus administration of intravenous contrast. Multiplanar CT image reconstructions and MIPs were obtained to evaluate the vascular anatomy. RADIATION DOSE REDUCTION: This exam was performed according to the departmental dose-optimization program which includes automated exposure control, adjustment of the mA and/or kV according to patient size and/or use of iterative reconstruction technique. CONTRAST:  75mL OMNIPAQUE  IOHEXOL  350 MG/ML SOLN COMPARISON:  Report only CT of the chest 03/31/2020. FINDINGS: Cardiovascular: There are segmental and subsegmental right lower lobe pulmonary emboli. Heart is mildly enlarged. No pericardial effusion. Aorta is normal in size. There are atherosclerotic calcifications of the aorta and coronary arteries. Mediastinum/Nodes: No enlarged mediastinal, hilar, or axillary lymph nodes. Thyroid gland, trachea, and esophagus demonstrate no significant findings. Lungs/Pleura: There is a small right pleural effusion. There is some mild peripheral patchy ground-glass and airspace opacities in the inferior right lower lobe. There is scattered apical pulmonary nodules measuring 4 mm or less. There is right lower lobe peribronchial wall thickening diffusely. Upper Abdomen: There are numerous bilateral renal cysts which are partially visualized measuring up to 8.8 cm. Many of these have thin peripheral calcifications. Musculoskeletal: There is an acute nondisplaced posterior left eleventh rib fracture. There are is a subacute posterior right eleventh rib fracture. There are additional healed fractures  bilaterally. There is mild compression deformity of T7 there is severe compression deformity of T12. These are age indeterminate, but favored as chronic. Review of the MIP images confirms the above findings. IMPRESSION: 1. Right lower lobe segmental and subsegmental pulmonary emboli. No evidence for right heart strain. 2. Small right pleural effusion. 3. Mild peripheral patchy ground-glass and airspace opacities in the inferior right lower lobe may be atelectasis or infarct. 4. Right lower lobe peribronchial wall thickening. 5. Acute nondisplaced posterior left eleventh rib fracture. Subacute posterior right eleventh rib fracture. 6. Age indeterminate compression deformities of T7 and T12. 7. Multiple bilateral renal cysts measuring up to 8.8 cm. These can be further evaluated with renal ultrasound. 8. Aortic atherosclerosis. Aortic Atherosclerosis (ICD10-I70.0). Electronically Signed: By: Tyron Gallon M.D. On: 06/24/2023 16:57   CT Head Wo Contrast Result Date: 06/24/2023 CLINICAL DATA:  Delirium. EXAM: CT HEAD WITHOUT CONTRAST TECHNIQUE: Contiguous axial images were obtained from the base of the skull through the vertex without intravenous contrast. RADIATION DOSE REDUCTION: This exam was performed according to the departmental dose-optimization program which includes automated exposure control, adjustment of the mA and/or kV according to patient size and/or use of iterative reconstruction technique. COMPARISON:  Head CT 07/07/2020 and MRI 07/05/2020 FINDINGS: The study is mildly to moderately motion degraded. Brain: There is no evidence of an acute infarct, intracranial hemorrhage, mass, midline shift, or extra-axial fluid collection. A chronic left PCA infarct was acute in 2022. There is ex vacuo dilatation of the temporal horn of the left lateral ventricle. Patchy hypodensities  elsewhere in the cerebral white matter bilaterally are nonspecific but compatible with moderate chronic small vessel ischemic  disease. Mild global cerebral atrophy is within normal limits for age. Vascular: Calcified atherosclerosis at the skull base. No hyperdense vessel. Skull: No fracture or suspicious lesion. Sinuses/Orbits: Mild mucosal thickening in the ethmoid sinuses. Clear mastoid air cells. Bilateral cataract extraction. Other: None. IMPRESSION: 1. No evidence of acute intracranial abnormality. 2. Chronic left PCA infarct. 3. Moderate chronic small vessel ischemic disease. Electronically Signed   By: Aundra Lee M.D.   On: 06/24/2023 15:25   DG Chest Port 1 View Result Date: 06/24/2023 CLINICAL DATA:  Sepsis, shortness of breath. EXAM: PORTABLE CHEST 1 VIEW COMPARISON:  Jun 21, 2023. FINDINGS: Stable cardiomediastinal silhouette with central pulmonary vascular congestion. Hypoinflation of the lungs is noted. No definite consolidative process is noted. Bony thorax is unremarkable. IMPRESSION: Mild central pulmonary vascular congestion with hypoinflation of the lungs. Electronically Signed   By: Rosalene Colon M.D.   On: 06/24/2023 14:02    Microbiology Recent Results (from the past 240 hours)  Blood Culture (routine x 2)     Status: None (Preliminary result)   Collection Time: 06/24/23 12:14 PM   Specimen: BLOOD  Result Value Ref Range Status   Specimen Description BLOOD BLOOD RIGHT FOREARM  Final   Special Requests   Final    BOTTLES DRAWN AEROBIC ONLY Blood Culture results may not be optimal due to an inadequate volume of blood received in culture bottles   Culture   Final    NO GROWTH 2 DAYS Performed at [facility], [address] [place], [place] [address]    Report Status PENDING  Incomplete  Blood Culture (routine x 2)     Status: None (Preliminary result)   Collection Time: 06/24/23 12:30 PM   Specimen: BLOOD  Result Value Ref Range Status   Specimen Description BLOOD RIGHT ANTECUBITAL  Final   Special Requests   Final    BOTTLES DRAWN AEROBIC ONLY Blood Culture results may not be optimal due  to an inadequate volume of blood received in culture bottles   Culture   Final    NO GROWTH 2 DAYS Performed at [facility], [address] [place], [place] [address]    Report Status PENDING  Incomplete  Resp panel by RT-PCR (RSV, Flu A&B, Covid) Anterior Nasal Swab     Status: None   Collection Time: 06/24/23  1:27 PM   Specimen: Anterior Nasal Swab  Result Value Ref Range Status   SARS Coronavirus 2 by RT PCR NEGATIVE NEGATIVE Final    Comment: (NOTE) SARS-CoV-2 target nucleic acids are NOT DETECTED.  The SARS-CoV-2 RNA is generally detectable in upper respiratory specimens during the acute phase of infection. The lowest concentration of SARS-CoV-2 viral copies this assay can detect is 138 copies/mL. A negative result does not preclude SARS-Cov-2 infection and should not be used as the sole basis for treatment or other patient management decisions. A negative result may occur with  improper specimen collection/handling, submission of specimen other than nasopharyngeal swab, presence of viral mutation(s) within the areas targeted by this assay, and inadequate number of viral copies(<138 copies/mL). A negative result must be combined with clinical observations, patient history, and epidemiological information. The expected result is Negative.  Fact Sheet for Patients:  BloggerCourse.com  Fact Sheet for Healthcare Providers:  SeriousBroker.it  This test is no t yet approved or cleared by the [place]  FDA and  has been authorized  for detection and/or diagnosis of SARS-CoV-2 by FDA under an Emergency Use Authorization (EUA). This EUA will remain  in effect (meaning this test can be used) for the duration of the COVID-19 declaration under Section 564(b)(1) of the Act, 21 U.S.C.section 360bbb-3(b)(1), unless the authorization is terminated  or revoked sooner.       Influenza A by PCR NEGATIVE NEGATIVE Final   Influenza B  by PCR NEGATIVE NEGATIVE Final    Comment: (NOTE) The Xpert Xpress SARS-CoV-2/FLU/RSV plus assay is intended as an aid in the diagnosis of influenza from Nasopharyngeal swab specimens and should not be used as a sole basis for treatment. Nasal washings and aspirates are unacceptable for Xpert Xpress SARS-CoV-2/FLU/RSV testing.  Fact Sheet for Patients: BloggerCourse.com  Fact Sheet for Healthcare Providers: SeriousBroker.it  This test is not yet approved or cleared by the [place]  FDA and has been authorized for detection and/or diagnosis of SARS-CoV-2 by FDA under an Emergency Use Authorization (EUA). This EUA will remain in effect (meaning this test can be used) for the duration of the COVID-19 declaration under Section 564(b)(1) of the Act, 21 U.S.C. section 360bbb-3(b)(1), unless the authorization is terminated or revoked.     Resp Syncytial Virus by PCR NEGATIVE NEGATIVE Final    Comment: (NOTE) Fact Sheet for Patients: BloggerCourse.com  Fact Sheet for Healthcare Providers: SeriousBroker.it  This test is not yet approved or cleared by the [place]  FDA and has been authorized for detection and/or diagnosis of SARS-CoV-2 by FDA under an Emergency Use Authorization (EUA). This EUA will remain in effect (meaning this test can be used) for the duration of the COVID-19 declaration under Section 564(b)(1) of the Act, 21 U.S.C. section 360bbb-3(b)(1), unless the authorization is terminated or revoked.  Performed at [facility], [address], [place], [place] [address]     Lab Basic Metabolic Panel: Recent Labs  Lab 06/24/23 1214  NA 134*  K 3.7  CL 94*  CO2 26  GLUCOSE 89  BUN 37*  CREATININE 5.00*  CALCIUM  9.4   Liver Function Tests: Recent Labs  Lab 06/24/23 1214  AST 92*  ALT 94*  ALKPHOS 90  BILITOT 1.1  PROT 6.9  ALBUMIN  3.6   No results  for input(s): "LIPASE", "AMYLASE" in the last 168 hours. No results for input(s): "AMMONIA" in the last 168 hours. CBC: Recent Labs  Lab 06/24/23 1214  WBC 5.9  NEUTROABS 4.2  HGB 12.0*  HCT 38.9*  MCV 106.0*  PLT 58*   Cardiac Enzymes: No results for input(s): "CKTOTAL", "CKMB", "CKMBINDEX", "TROPONINI" in the last 168 hours. Sepsis Labs: Recent Labs  Lab 06/24/23 1214  WBC 5.9  LATICACIDVEN 1.7    Procedures/Operations   Zoey Bidwell 06/28/2023, 4:39 PM How to contact the [facility] Attending or Consulting provider 7A - 7P or covering provider during after hours 7P -7A, for this patient?  Check the care team in [facility] and look for a) attending/consulting [facility] provider listed and b) the [facility] team listed Log into [email or web address] and use [facility]'s universal password to access. If you do not have the password, please contact the hospital operator. Locate the [facility] provider you are looking for under [facility] and page to a number that you can be directly reached. If you still have difficulty reaching the provider, please page the [facility] (Director on Call) for the Hospitalists listed on amion for assistance.

## 2023-07-04 NOTE — Progress Notes (Signed)
 PROGRESS NOTE   Taylor Willis  WGN:[id number] DOB: 03-18-1940 DOA: 06/24/2023 PCP: Lauran Pollard, MD   Chief Complaint  Patient presents with   Shortness of Breath   Level of care: Telemetry  Brief Admission History:   83 y.o. 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, GERD, HTN, gout, and CVA, A-fib, on Eliquis , hypertension, prostate cancer, patient presents to ED, given increased weakness, deconditioning, increased confusion and lethargy, fall, cough, weakness, unable to go for his dialysis today, wife at bedside provides most of the history given his unresponsiveness, patient was seen couple times already this week by his PCP, where they were told he has overall poor prognosis, he had a fall from the bed earlier in the week as well, has not been eating, drinking, so obtunded this morning could not go for dialysis so family brought him here for further evaluation. - In ED patient was noted to be obtundent, minimally responsive, tachypneic, tachycardic, poorly responsive, workup significant for a new PE, pneumonia and pleural effusion, he is heart rate in the 40s to 50s, shallow breathing, tachypneic appears to be at end-of-life, I have discussed with family goals of care, reports they have been told this by his primary care doctor, at this point they do not wish to prolong his footing given recently significant decline in mental and functional status and poor life quality, decision has been made to proceed for comfort measures.   Assessment and Plan: Principal Problem:   Acute metabolic encephalopathy Active Problems:   Acute respiratory failure with hypoxia (HCC)   ESRD (end stage renal disease) (HCC)   Anemia in chronic kidney disease   Benign essential hypertension   Personal history of malignant neoplasm of prostate   History of UTI   History of atrial fibrillation   Acute pulmonary embolism (HCC)   End of life care   Fall   Rib fracture   FTT (failure  to thrive) in adult   Thrombocytopenia (HCC)  Pt has been admitted for full comfort measures.  I had discussion with wife about transition to residential hospice and she requested more time to meet with other family members to make a decision.  Pt likely has hours to days. Continue full comfort care orders.   DVT prophylaxis: full comfort care  Code Status: DNR  Family Communication: wife at bedside  Disposition: TBD    Consultants:   Procedures:   Antimicrobials:    Subjective: Pt intermittently has been interactive with family.   Objective: Vitals:   06/24/23 1255 06/24/23 1415 06/24/23 1730 06/24/23 2042  BP: (!) 174/96 (!) 167/71 (!) 166/65 (!) 169/75  Pulse: (!) 54 (!) 50 (!) 52 (!) 50  Resp: (!) 22 (!) 23 19 18   Temp:    (!) 97.5 F (36.4 C)  TempSrc:    Oral  SpO2: 100% 100% 100% 94%  Weight:      Height:        Intake/Output Summary (Last 24 hours) at 07/03/2023 1332 Last data filed at 06/20/2023 7846 Gross per 24 hour  Intake 0 ml  Output --  Net 0 ml   Filed Weights   06/24/23 1153  Weight: 95.3 kg   Examination:  General exam: Appears calm and comfortable he is somnolent, not very arousable today.   Respiratory system: no increased work of breathing.  Cardiovascular system: normal S1 & S2 heard. No JVD, murmurs, rubs, gallops or clicks. No pedal edema. Gastrointestinal system: Abdomen  is nondistended, soft and nontender. No organomegaly or masses felt. Normal bowel sounds heard. Central nervous system: somnolent. No focal neurological deficits. Extremities: Symmetric 5 x 5 power. Skin: No gross lesions seen. Psychiatry: UTD.   Data Reviewed: I have personally reviewed following labs and imaging studies  CBC: Recent Labs  Lab 06/24/23 1214  WBC 5.9  NEUTROABS 4.2  HGB 12.0*  HCT 38.9*  MCV 106.0*  PLT 58*    Basic Metabolic Panel: Recent Labs  Lab 06/24/23 1214  NA 134*  K 3.7  CL 94*  CO2 26  GLUCOSE 89  BUN 37*  CREATININE  5.00*  CALCIUM  9.4    CBG: No results for input(s): "GLUCAP" in the last 168 hours.  Recent Results (from the past 240 hours)  Blood Culture (routine x 2)     Status: None (Preliminary result)   Collection Time: 06/24/23 12:14 PM   Specimen: BLOOD  Result Value Ref Range Status   Specimen Description BLOOD BLOOD RIGHT FOREARM  Final   Special Requests   Final    BOTTLES DRAWN AEROBIC ONLY Blood Culture results may not be optimal due to an inadequate volume of blood received in culture bottles   Culture   Final    NO GROWTH 2 DAYS Performed at [facility], [address], [place], [place] [address]    Report Status PENDING  Incomplete  Blood Culture (routine x 2)     Status: None (Preliminary result)   Collection Time: 06/24/23 12:30 PM   Specimen: BLOOD  Result Value Ref Range Status   Specimen Description BLOOD RIGHT ANTECUBITAL  Final   Special Requests   Final    BOTTLES DRAWN AEROBIC ONLY Blood Culture results may not be optimal due to an inadequate volume of blood received in culture bottles   Culture   Final    NO GROWTH 2 DAYS Performed at [facility], [address], [place], [place] [address]    Report Status PENDING  Incomplete  Resp panel by RT-PCR (RSV, Flu A&B, Covid) Anterior Nasal Swab     Status: None   Collection Time: 06/24/23  1:27 PM   Specimen: Anterior Nasal Swab  Result Value Ref Range Status   SARS Coronavirus 2 by RT PCR NEGATIVE NEGATIVE Final    Comment: (NOTE) SARS-CoV-2 target nucleic acids are NOT DETECTED.  The SARS-CoV-2 RNA is generally detectable in upper respiratory specimens during the acute phase of infection. The lowest concentration of SARS-CoV-2 viral copies this assay can detect is 138 copies/mL. A negative result does not preclude SARS-Cov-2 infection and should not be used as the sole basis for treatment or other patient management decisions. A negative result may occur with  improper specimen collection/handling, submission of  specimen other than nasopharyngeal swab, presence of viral mutation(s) within the areas targeted by this assay, and inadequate number of viral copies(<138 copies/mL). A negative result must be combined with clinical observations, patient history, and epidemiological information. The expected result is Negative.  Fact Sheet for Patients:  BloggerCourse.com  Fact Sheet for Healthcare Providers:  SeriousBroker.it  This test is no t yet approved or cleared by the [place]  FDA and  has been authorized for detection and/or diagnosis of SARS-CoV-2 by FDA under an Emergency Use Authorization (EUA). This EUA will remain  in effect (meaning this test can be used) for the duration of the COVID-19 declaration under Section 564(b)(1) of the Act, 21 U.S.C.section 360bbb-3(b)(1), unless the authorization is terminated  or revoked sooner.  Influenza A by PCR NEGATIVE NEGATIVE Final   Influenza B by PCR NEGATIVE NEGATIVE Final    Comment: (NOTE) The Xpert Xpress SARS-CoV-2/FLU/RSV plus assay is intended as an aid in the diagnosis of influenza from Nasopharyngeal swab specimens and should not be used as a sole basis for treatment. Nasal washings and aspirates are unacceptable for Xpert Xpress SARS-CoV-2/FLU/RSV testing.  Fact Sheet for Patients: BloggerCourse.com  Fact Sheet for Healthcare Providers: SeriousBroker.it  This test is not yet approved or cleared by the [place]  FDA and has been authorized for detection and/or diagnosis of SARS-CoV-2 by FDA under an Emergency Use Authorization (EUA). This EUA will remain in effect (meaning this test can be used) for the duration of the COVID-19 declaration under Section 564(b)(1) of the Act, 21 U.S.C. section 360bbb-3(b)(1), unless the authorization is terminated or revoked.     Resp Syncytial Virus by PCR NEGATIVE NEGATIVE Final     Comment: (NOTE) Fact Sheet for Patients: BloggerCourse.com  Fact Sheet for Healthcare Providers: SeriousBroker.it  This test is not yet approved or cleared by the [place]  FDA and has been authorized for detection and/or diagnosis of SARS-CoV-2 by FDA under an Emergency Use Authorization (EUA). This EUA will remain in effect (meaning this test can be used) for the duration of the COVID-19 declaration under Section 564(b)(1) of the Act, 21 U.S.C. section 360bbb-3(b)(1), unless the authorization is terminated or revoked.  Performed at [facility], [address], [place], [place] [address]      Radiology Studies: CT Angio Chest PE W and/or Wo Contrast Addendum Date: 06/24/2023 ADDENDUM REPORT: 06/24/2023 20:21 ADDENDUM: These results were called by telephone at the time of interpretation on 06/24/2023 at 5:04 Pm to provider Dr. Georgie Kiss, who verbally acknowledged these results. Electronically Signed   By: Tyron Gallon M.D.   On: 06/24/2023 20:21   Result Date: 06/24/2023 CLINICAL DATA:  High probability for PE.  Shortness of breath. EXAM: CT ANGIOGRAPHY CHEST WITH CONTRAST TECHNIQUE: Multidetector CT imaging of the chest was performed using the standard protocol during bolus administration of intravenous contrast. Multiplanar CT image reconstructions and MIPs were obtained to evaluate the vascular anatomy. RADIATION DOSE REDUCTION: This exam was performed according to the departmental dose-optimization program which includes automated exposure control, adjustment of the mA and/or kV according to patient size and/or use of iterative reconstruction technique. CONTRAST:  75mL OMNIPAQUE  IOHEXOL  350 MG/ML SOLN COMPARISON:  Report only CT of the chest 03/31/2020. FINDINGS: Cardiovascular: There are segmental and subsegmental right lower lobe pulmonary emboli. Heart is mildly enlarged. No pericardial effusion. Aorta is normal in size. There are  atherosclerotic calcifications of the aorta and coronary arteries. Mediastinum/Nodes: No enlarged mediastinal, hilar, or axillary lymph nodes. Thyroid gland, trachea, and esophagus demonstrate no significant findings. Lungs/Pleura: There is a small right pleural effusion. There is some mild peripheral patchy ground-glass and airspace opacities in the inferior right lower lobe. There is scattered apical pulmonary nodules measuring 4 mm or less. There is right lower lobe peribronchial wall thickening diffusely. Upper Abdomen: There are numerous bilateral renal cysts which are partially visualized measuring up to 8.8 cm. Many of these have thin peripheral calcifications. Musculoskeletal: There is an acute nondisplaced posterior left eleventh rib fracture. There are is a subacute posterior right eleventh rib fracture. There are additional healed fractures bilaterally. There is mild compression deformity of T7 there is severe compression deformity of T12. These are age indeterminate, but favored as chronic. Review of the MIP images confirms the above findings.  IMPRESSION: 1. Right lower lobe segmental and subsegmental pulmonary emboli. No evidence for right heart strain. 2. Small right pleural effusion. 3. Mild peripheral patchy ground-glass and airspace opacities in the inferior right lower lobe may be atelectasis or infarct. 4. Right lower lobe peribronchial wall thickening. 5. Acute nondisplaced posterior left eleventh rib fracture. Subacute posterior right eleventh rib fracture. 6. Age indeterminate compression deformities of T7 and T12. 7. Multiple bilateral renal cysts measuring up to 8.8 cm. These can be further evaluated with renal ultrasound. 8. Aortic atherosclerosis. Aortic Atherosclerosis (ICD10-I70.0). Electronically Signed: By: Tyron Gallon M.D. On: 06/24/2023 16:57   CT Head Wo Contrast Result Date: 06/24/2023 CLINICAL DATA:  Delirium. EXAM: CT HEAD WITHOUT CONTRAST TECHNIQUE: Contiguous axial images  were obtained from the base of the skull through the vertex without intravenous contrast. RADIATION DOSE REDUCTION: This exam was performed according to the departmental dose-optimization program which includes automated exposure control, adjustment of the mA and/or kV according to patient size and/or use of iterative reconstruction technique. COMPARISON:  Head CT 07/07/2020 and MRI 07/05/2020 FINDINGS: The study is mildly to moderately motion degraded. Brain: There is no evidence of an acute infarct, intracranial hemorrhage, mass, midline shift, or extra-axial fluid collection. A chronic left PCA infarct was acute in 2022. There is ex vacuo dilatation of the temporal horn of the left lateral ventricle. Patchy hypodensities elsewhere in the cerebral white matter bilaterally are nonspecific but compatible with moderate chronic small vessel ischemic disease. Mild global cerebral atrophy is within normal limits for age. Vascular: Calcified atherosclerosis at the skull base. No hyperdense vessel. Skull: No fracture or suspicious lesion. Sinuses/Orbits: Mild mucosal thickening in the ethmoid sinuses. Clear mastoid air cells. Bilateral cataract extraction. Other: None. IMPRESSION: 1. No evidence of acute intracranial abnormality. 2. Chronic left PCA infarct. 3. Moderate chronic small vessel ischemic disease. Electronically Signed   By: Aundra Lee M.D.   On: 06/24/2023 15:25   Scheduled Meds: Continuous Infusions:   LOS: 2 days   Time spent: 38 mins  Khadeejah Castner Lincoln Renshaw, MD How to contact the [facility] Attending or Consulting provider 7A - 7P or covering provider during after hours 7P -7A, for this patient?  Check the care team in [facility] and look for a) attending/consulting [facility] provider listed and b) the [facility] team listed Log into [email or web address] to find provider on call.  Locate the [facility] provider you are looking for under [facility] and page to a number that you can be directly reached. If you still have difficulty  reaching the provider, please page the [facility] (Director on Call) for the Hospitalists listed on amion for assistance.  06/17/2023, 1:32 PM

## 2023-07-04 DEATH — deceased
